# Patient Record
Sex: Female | Born: 1960
Health system: Southern US, Community
[De-identification: ages and names within clinical notes are randomized; demographics above are authoritative.]

## PROBLEM LIST (undated history)

## (undated) DIAGNOSIS — A4902 Methicillin resistant Staphylococcus aureus infection, unspecified site: Secondary | ICD-10-CM

## (undated) DIAGNOSIS — I10 Essential (primary) hypertension: Secondary | ICD-10-CM

## (undated) DIAGNOSIS — R7303 Prediabetes: Secondary | ICD-10-CM

## (undated) DIAGNOSIS — R519 Headache, unspecified: Secondary | ICD-10-CM

## (undated) DIAGNOSIS — F329 Major depressive disorder, single episode, unspecified: Secondary | ICD-10-CM

## (undated) DIAGNOSIS — E785 Hyperlipidemia, unspecified: Secondary | ICD-10-CM

## (undated) DIAGNOSIS — T7840XA Allergy, unspecified, initial encounter: Secondary | ICD-10-CM

## (undated) DIAGNOSIS — F32A Depression, unspecified: Secondary | ICD-10-CM

## (undated) DIAGNOSIS — R51 Headache: Secondary | ICD-10-CM

## (undated) DIAGNOSIS — K219 Gastro-esophageal reflux disease without esophagitis: Secondary | ICD-10-CM

## (undated) DIAGNOSIS — E119 Type 2 diabetes mellitus without complications: Secondary | ICD-10-CM

## (undated) DIAGNOSIS — F419 Anxiety disorder, unspecified: Secondary | ICD-10-CM

## (undated) DIAGNOSIS — M1990 Unspecified osteoarthritis, unspecified site: Secondary | ICD-10-CM

## (undated) HISTORY — PX: COLONOSCOPY: SHX174

## (undated) HISTORY — DX: Type 2 diabetes mellitus without complications: E11.9

## (undated) HISTORY — DX: Anxiety disorder, unspecified: F41.9

## (undated) HISTORY — PX: ABDOMINAL HYSTERECTOMY: SHX81

## (undated) HISTORY — DX: Hyperlipidemia, unspecified: E78.5

## (undated) HISTORY — DX: Allergy, unspecified, initial encounter: T78.40XA

## (undated) HISTORY — DX: Gastro-esophageal reflux disease without esophagitis: K21.9

---

## 1997-12-06 ENCOUNTER — Emergency Department (HOSPITAL_COMMUNITY): Admission: EM | Admit: 1997-12-06 | Discharge: 1997-12-06 | Payer: Self-pay | Admitting: Emergency Medicine

## 1998-05-08 ENCOUNTER — Encounter: Payer: Self-pay | Admitting: Emergency Medicine

## 1998-05-08 ENCOUNTER — Emergency Department (HOSPITAL_COMMUNITY): Admission: EM | Admit: 1998-05-08 | Discharge: 1998-05-08 | Payer: Self-pay | Admitting: Emergency Medicine

## 1998-05-12 ENCOUNTER — Encounter: Payer: Self-pay | Admitting: Orthopaedic Surgery

## 1998-05-12 ENCOUNTER — Ambulatory Visit (HOSPITAL_COMMUNITY): Admission: RE | Admit: 1998-05-12 | Discharge: 1998-05-12 | Payer: Self-pay | Admitting: Orthopaedic Surgery

## 1998-05-22 ENCOUNTER — Emergency Department (HOSPITAL_COMMUNITY): Admission: EM | Admit: 1998-05-22 | Discharge: 1998-05-22 | Payer: Self-pay | Admitting: Family Medicine

## 1998-11-19 ENCOUNTER — Inpatient Hospital Stay (HOSPITAL_COMMUNITY): Admission: AD | Admit: 1998-11-19 | Discharge: 1998-11-19 | Payer: Self-pay | Admitting: Obstetrics

## 1998-11-20 ENCOUNTER — Inpatient Hospital Stay (HOSPITAL_COMMUNITY): Admission: AD | Admit: 1998-11-20 | Discharge: 1998-11-20 | Payer: Self-pay | Admitting: Obstetrics

## 1999-11-27 ENCOUNTER — Encounter: Payer: Self-pay | Admitting: Emergency Medicine

## 1999-11-27 ENCOUNTER — Emergency Department (HOSPITAL_COMMUNITY): Admission: EM | Admit: 1999-11-27 | Discharge: 1999-11-27 | Payer: Self-pay | Admitting: Emergency Medicine

## 2000-03-09 ENCOUNTER — Inpatient Hospital Stay (HOSPITAL_COMMUNITY): Admission: AD | Admit: 2000-03-09 | Discharge: 2000-03-09 | Payer: Self-pay | Admitting: Obstetrics

## 2000-05-03 ENCOUNTER — Ambulatory Visit (HOSPITAL_COMMUNITY): Admission: RE | Admit: 2000-05-03 | Discharge: 2000-05-03 | Payer: Self-pay | Admitting: Obstetrics

## 2000-05-03 ENCOUNTER — Encounter: Payer: Self-pay | Admitting: Obstetrics

## 2000-09-26 ENCOUNTER — Inpatient Hospital Stay (HOSPITAL_COMMUNITY): Admission: AD | Admit: 2000-09-26 | Discharge: 2000-09-26 | Payer: Self-pay | Admitting: Obstetrics

## 2003-02-14 ENCOUNTER — Emergency Department (HOSPITAL_COMMUNITY): Admission: EM | Admit: 2003-02-14 | Discharge: 2003-02-14 | Payer: Self-pay | Admitting: Emergency Medicine

## 2004-02-10 ENCOUNTER — Emergency Department (HOSPITAL_COMMUNITY): Admission: EM | Admit: 2004-02-10 | Discharge: 2004-02-10 | Payer: Self-pay | Admitting: Emergency Medicine

## 2005-09-19 ENCOUNTER — Emergency Department (HOSPITAL_COMMUNITY): Admission: EM | Admit: 2005-09-19 | Discharge: 2005-09-19 | Payer: Self-pay | Admitting: Emergency Medicine

## 2005-11-10 DIAGNOSIS — J45909 Unspecified asthma, uncomplicated: Secondary | ICD-10-CM | POA: Insufficient documentation

## 2006-09-22 ENCOUNTER — Encounter (INDEPENDENT_AMBULATORY_CARE_PROVIDER_SITE_OTHER): Payer: Self-pay | Admitting: Internal Medicine

## 2006-09-26 ENCOUNTER — Ambulatory Visit: Payer: Self-pay | Admitting: Family Medicine

## 2006-09-27 ENCOUNTER — Ambulatory Visit: Payer: Self-pay | Admitting: *Deleted

## 2006-09-28 ENCOUNTER — Encounter (INDEPENDENT_AMBULATORY_CARE_PROVIDER_SITE_OTHER): Payer: Self-pay | Admitting: Family Medicine

## 2006-11-15 ENCOUNTER — Emergency Department (HOSPITAL_COMMUNITY): Admission: EM | Admit: 2006-11-15 | Discharge: 2006-11-15 | Payer: Self-pay | Admitting: Emergency Medicine

## 2006-11-17 ENCOUNTER — Emergency Department (HOSPITAL_COMMUNITY): Admission: EM | Admit: 2006-11-17 | Discharge: 2006-11-17 | Payer: Self-pay | Admitting: Family Medicine

## 2006-11-23 ENCOUNTER — Encounter (INDEPENDENT_AMBULATORY_CARE_PROVIDER_SITE_OTHER): Payer: Self-pay | Admitting: Internal Medicine

## 2006-11-24 ENCOUNTER — Ambulatory Visit: Payer: Self-pay | Admitting: Internal Medicine

## 2006-11-24 DIAGNOSIS — R109 Unspecified abdominal pain: Secondary | ICD-10-CM

## 2006-11-24 DIAGNOSIS — F17200 Nicotine dependence, unspecified, uncomplicated: Secondary | ICD-10-CM

## 2006-11-24 LAB — CONVERTED CEMR LAB
Ketones, urine, test strip: NEGATIVE
Nitrite: NEGATIVE
Specific Gravity, Urine: 1.02
pH: 6

## 2006-11-26 ENCOUNTER — Encounter (INDEPENDENT_AMBULATORY_CARE_PROVIDER_SITE_OTHER): Payer: Self-pay | Admitting: Internal Medicine

## 2006-11-26 LAB — CONVERTED CEMR LAB
ALT: 14 units/L (ref 0–35)
AST: 14 units/L (ref 0–37)
Albumin: 4.3 g/dL (ref 3.5–5.2)
Alkaline Phosphatase: 83 units/L (ref 39–117)
Basophils Relative: 0 % (ref 0–1)
Chloride: 108 meq/L (ref 96–112)
Cholesterol: 169 mg/dL (ref 0–200)
Eosinophils Absolute: 0.1 10*3/uL — ABNORMAL LOW (ref 0.2–0.7)
Glucose, Bld: 60 mg/dL — ABNORMAL LOW (ref 70–99)
HCT: 42.8 % (ref 36.0–46.0)
HDL: 54 mg/dL (ref >39)
LDL Cholesterol: 98 mg/dL (ref 0–99)
Lymphocytes Relative: 33 % (ref 12–46)
Lymphs Abs: 7.9 10*3/uL — ABNORMAL HIGH (ref 0.7–4.0)
RBC: 4.44 M/uL (ref 3.87–5.11)
RDW: 15.4 % (ref 11.5–15.5)
Total CHOL/HDL Ratio: 3.1
Total Protein: 6.7 g/dL (ref 6.0–8.3)
Triglycerides: 87 mg/dL (ref <150)

## 2006-11-28 ENCOUNTER — Telehealth (INDEPENDENT_AMBULATORY_CARE_PROVIDER_SITE_OTHER): Payer: Self-pay | Admitting: Internal Medicine

## 2006-11-28 ENCOUNTER — Encounter: Admission: RE | Admit: 2006-11-28 | Discharge: 2006-11-28 | Payer: Self-pay | Admitting: Internal Medicine

## 2006-11-29 ENCOUNTER — Ambulatory Visit (HOSPITAL_COMMUNITY): Admission: RE | Admit: 2006-11-29 | Discharge: 2006-11-29 | Payer: Self-pay | Admitting: Internal Medicine

## 2006-12-05 ENCOUNTER — Ambulatory Visit: Payer: Self-pay | Admitting: Internal Medicine

## 2006-12-10 ENCOUNTER — Emergency Department (HOSPITAL_COMMUNITY): Admission: EM | Admit: 2006-12-10 | Discharge: 2006-12-10 | Payer: Self-pay | Admitting: Emergency Medicine

## 2006-12-14 ENCOUNTER — Encounter (INDEPENDENT_AMBULATORY_CARE_PROVIDER_SITE_OTHER): Payer: Self-pay | Admitting: Internal Medicine

## 2006-12-14 LAB — CONVERTED CEMR LAB
Basophils Absolute: 0 10*3/uL (ref 0.0–0.1)
Eosinophils Absolute: 0.1 10*3/uL — ABNORMAL LOW (ref 0.2–0.7)
HCT: 42.9 % (ref 36.0–46.0)
Hemoglobin: 13.8 g/dL (ref 12.0–15.0)
Lymphocytes Relative: 48 % — ABNORMAL HIGH (ref 12–46)
MCHC: 32.2 g/dL (ref 30.0–36.0)
Neutro Abs: 3 10*3/uL (ref 1.7–7.7)
RDW: 15.2 % (ref 11.5–15.5)

## 2007-01-09 ENCOUNTER — Encounter (INDEPENDENT_AMBULATORY_CARE_PROVIDER_SITE_OTHER): Payer: Self-pay | Admitting: Internal Medicine

## 2007-05-17 ENCOUNTER — Emergency Department (HOSPITAL_COMMUNITY): Admission: EM | Admit: 2007-05-17 | Discharge: 2007-05-17 | Payer: Self-pay | Admitting: Emergency Medicine

## 2007-05-17 ENCOUNTER — Ambulatory Visit: Payer: Self-pay | Admitting: Nurse Practitioner

## 2007-05-17 DIAGNOSIS — B86 Scabies: Secondary | ICD-10-CM

## 2007-08-07 ENCOUNTER — Encounter: Admission: RE | Admit: 2007-08-07 | Discharge: 2007-08-07 | Payer: Self-pay | Admitting: Internal Medicine

## 2007-08-18 ENCOUNTER — Emergency Department (HOSPITAL_COMMUNITY): Admission: EM | Admit: 2007-08-18 | Discharge: 2007-08-18 | Payer: Self-pay | Admitting: Emergency Medicine

## 2007-08-21 ENCOUNTER — Emergency Department (HOSPITAL_COMMUNITY): Admission: EM | Admit: 2007-08-21 | Discharge: 2007-08-21 | Payer: Self-pay | Admitting: Emergency Medicine

## 2007-10-10 ENCOUNTER — Emergency Department (HOSPITAL_COMMUNITY): Admission: EM | Admit: 2007-10-10 | Discharge: 2007-10-11 | Payer: Self-pay | Admitting: Family Medicine

## 2007-10-19 ENCOUNTER — Telehealth (INDEPENDENT_AMBULATORY_CARE_PROVIDER_SITE_OTHER): Payer: Self-pay | Admitting: Internal Medicine

## 2008-02-15 ENCOUNTER — Ambulatory Visit: Payer: Self-pay | Admitting: Internal Medicine

## 2008-02-15 LAB — CONVERTED CEMR LAB
Bilirubin Urine: NEGATIVE
Specific Gravity, Urine: >=1.03
WBC Urine, dipstick: NEGATIVE

## 2008-02-19 ENCOUNTER — Encounter: Admission: RE | Admit: 2008-02-19 | Discharge: 2008-02-19 | Payer: Self-pay | Admitting: Internal Medicine

## 2008-02-26 LAB — CONVERTED CEMR LAB
CO2: 24 meq/L (ref 19–32)
Glucose, Bld: 86 mg/dL (ref 70–99)
Potassium: 5.5 meq/L — ABNORMAL HIGH (ref 3.5–5.3)
Sodium: 138 meq/L (ref 135–145)

## 2008-02-29 ENCOUNTER — Ambulatory Visit: Payer: Self-pay | Admitting: Internal Medicine

## 2008-02-29 ENCOUNTER — Telehealth (INDEPENDENT_AMBULATORY_CARE_PROVIDER_SITE_OTHER): Payer: Self-pay | Admitting: Internal Medicine

## 2008-03-10 LAB — CONVERTED CEMR LAB: Potassium: 5.4 meq/L — ABNORMAL HIGH (ref 3.5–5.3)

## 2008-04-06 ENCOUNTER — Emergency Department (HOSPITAL_COMMUNITY): Admission: EM | Admit: 2008-04-06 | Discharge: 2008-04-07 | Payer: Self-pay | Admitting: Emergency Medicine

## 2008-04-29 ENCOUNTER — Ambulatory Visit: Payer: Self-pay | Admitting: Internal Medicine

## 2008-04-29 DIAGNOSIS — M549 Dorsalgia, unspecified: Secondary | ICD-10-CM | POA: Insufficient documentation

## 2008-04-30 ENCOUNTER — Ambulatory Visit (HOSPITAL_COMMUNITY): Admission: RE | Admit: 2008-04-30 | Discharge: 2008-04-30 | Payer: Self-pay | Admitting: Internal Medicine

## 2008-05-02 ENCOUNTER — Ambulatory Visit: Payer: Self-pay | Admitting: Internal Medicine

## 2008-05-02 DIAGNOSIS — N3946 Mixed incontinence: Secondary | ICD-10-CM

## 2008-05-02 DIAGNOSIS — E875 Hyperkalemia: Secondary | ICD-10-CM | POA: Insufficient documentation

## 2008-05-02 LAB — CONVERTED CEMR LAB
BUN: 12 mg/dL (ref 6–23)
Chloride: 107 meq/L (ref 96–112)
Creatinine, Ser: 1.1 mg/dL (ref 0.40–1.20)
Glucose, Urine, Semiquant: NEGATIVE
Nitrite: NEGATIVE
Potassium: 5.6 meq/L — ABNORMAL HIGH (ref 3.5–5.3)
Sodium: 144 meq/L (ref 135–145)
Specific Gravity, Urine: >=1.03
Urobilinogen, UA: 0.2
pH: 5

## 2008-05-04 ENCOUNTER — Encounter (INDEPENDENT_AMBULATORY_CARE_PROVIDER_SITE_OTHER): Payer: Self-pay | Admitting: Internal Medicine

## 2008-06-09 ENCOUNTER — Emergency Department (HOSPITAL_COMMUNITY): Admission: EM | Admit: 2008-06-09 | Discharge: 2008-06-09 | Payer: Self-pay | Admitting: Family Medicine

## 2008-10-22 ENCOUNTER — Encounter (INDEPENDENT_AMBULATORY_CARE_PROVIDER_SITE_OTHER): Payer: Self-pay | Admitting: Internal Medicine

## 2009-04-15 ENCOUNTER — Emergency Department (HOSPITAL_COMMUNITY): Admission: EM | Admit: 2009-04-15 | Discharge: 2009-04-15 | Payer: Self-pay | Admitting: Emergency Medicine

## 2009-04-17 ENCOUNTER — Emergency Department (HOSPITAL_COMMUNITY): Admission: EM | Admit: 2009-04-17 | Discharge: 2009-04-17 | Payer: Self-pay | Admitting: Family Medicine

## 2009-05-19 ENCOUNTER — Ambulatory Visit: Payer: Self-pay | Admitting: Internal Medicine

## 2009-05-19 DIAGNOSIS — R5381 Other malaise: Secondary | ICD-10-CM

## 2009-05-19 DIAGNOSIS — F329 Major depressive disorder, single episode, unspecified: Secondary | ICD-10-CM

## 2009-05-19 DIAGNOSIS — R5383 Other fatigue: Secondary | ICD-10-CM

## 2009-05-29 ENCOUNTER — Ambulatory Visit (HOSPITAL_COMMUNITY): Admission: RE | Admit: 2009-05-29 | Discharge: 2009-05-29 | Payer: Self-pay | Admitting: Internal Medicine

## 2009-06-02 ENCOUNTER — Ambulatory Visit: Payer: Self-pay | Admitting: Internal Medicine

## 2009-06-02 DIAGNOSIS — R519 Headache, unspecified: Secondary | ICD-10-CM | POA: Insufficient documentation

## 2009-06-02 DIAGNOSIS — E785 Hyperlipidemia, unspecified: Secondary | ICD-10-CM

## 2009-06-02 DIAGNOSIS — R071 Chest pain on breathing: Secondary | ICD-10-CM

## 2009-06-02 DIAGNOSIS — R51 Headache: Secondary | ICD-10-CM | POA: Insufficient documentation

## 2009-06-09 ENCOUNTER — Telehealth (INDEPENDENT_AMBULATORY_CARE_PROVIDER_SITE_OTHER): Payer: Self-pay | Admitting: Internal Medicine

## 2009-06-16 ENCOUNTER — Ambulatory Visit: Payer: Self-pay | Admitting: Internal Medicine

## 2009-06-29 ENCOUNTER — Encounter (INDEPENDENT_AMBULATORY_CARE_PROVIDER_SITE_OTHER): Payer: Self-pay | Admitting: Internal Medicine

## 2009-07-09 ENCOUNTER — Ambulatory Visit: Payer: Self-pay | Admitting: Internal Medicine

## 2009-07-31 ENCOUNTER — Ambulatory Visit: Payer: Self-pay | Admitting: Internal Medicine

## 2009-08-04 ENCOUNTER — Emergency Department (HOSPITAL_COMMUNITY): Admission: EM | Admit: 2009-08-04 | Discharge: 2009-08-04 | Payer: Self-pay | Admitting: Emergency Medicine

## 2009-08-10 ENCOUNTER — Telehealth: Payer: Self-pay | Admitting: Physician Assistant

## 2009-08-21 ENCOUNTER — Ambulatory Visit: Payer: Self-pay | Admitting: Internal Medicine

## 2009-08-21 DIAGNOSIS — L723 Sebaceous cyst: Secondary | ICD-10-CM

## 2009-08-27 ENCOUNTER — Telehealth (INDEPENDENT_AMBULATORY_CARE_PROVIDER_SITE_OTHER): Payer: Self-pay | Admitting: Internal Medicine

## 2009-09-07 ENCOUNTER — Encounter (INDEPENDENT_AMBULATORY_CARE_PROVIDER_SITE_OTHER): Payer: Self-pay | Admitting: Internal Medicine

## 2009-09-07 ENCOUNTER — Encounter: Admission: RE | Admit: 2009-09-07 | Discharge: 2009-10-02 | Payer: Self-pay | Admitting: Internal Medicine

## 2009-10-01 ENCOUNTER — Ambulatory Visit: Payer: Self-pay | Admitting: Internal Medicine

## 2009-10-02 ENCOUNTER — Encounter (INDEPENDENT_AMBULATORY_CARE_PROVIDER_SITE_OTHER): Payer: Self-pay | Admitting: Internal Medicine

## 2009-11-27 ENCOUNTER — Encounter (INDEPENDENT_AMBULATORY_CARE_PROVIDER_SITE_OTHER): Payer: Self-pay | Admitting: Internal Medicine

## 2009-12-31 ENCOUNTER — Ambulatory Visit: Payer: Self-pay | Admitting: Internal Medicine

## 2010-02-04 ENCOUNTER — Ambulatory Visit
Admission: RE | Admit: 2010-02-04 | Discharge: 2010-02-04 | Payer: Self-pay | Source: Home / Self Care | Attending: Internal Medicine | Admitting: Internal Medicine

## 2010-02-04 DIAGNOSIS — L02229 Furuncle of trunk, unspecified: Secondary | ICD-10-CM

## 2010-02-04 DIAGNOSIS — L02239 Carbuncle of trunk, unspecified: Secondary | ICD-10-CM | POA: Insufficient documentation

## 2010-02-04 DIAGNOSIS — G4700 Insomnia, unspecified: Secondary | ICD-10-CM | POA: Insufficient documentation

## 2010-02-07 LAB — CONVERTED CEMR LAB
ALT: 16 units/L (ref 0–35)
AST: 25 units/L (ref 0–37)
Albumin: 4.7 g/dL (ref 3.5–5.2)
Alkaline Phosphatase: 108 units/L (ref 39–117)
Calcium: 10.2 mg/dL (ref 8.4–10.5)
Chloride: 108 meq/L (ref 96–112)
Eosinophils Relative: 2 % (ref 0–5)
Glucose, Bld: 89 mg/dL (ref 70–99)
Hemoglobin: 13.3 g/dL (ref 12.0–15.0)
MCHC: 32.1 g/dL (ref 30.0–36.0)
MCV: 90 fL (ref 78.0–100.0)
Neutrophils Relative %: 43 % (ref 43–77)
OCCULT 3: NEGATIVE
Platelets: 283 10*3/uL (ref 150–400)
Potassium: 5.1 meq/L (ref 3.5–5.3)
Triglycerides: 335 mg/dL — ABNORMAL HIGH (ref <150)
VLDL: 67 mg/dL — ABNORMAL HIGH (ref 0–40)
WBC: 10 10*3/uL (ref 4.0–10.5)

## 2010-02-09 NOTE — Assessment & Plan Note (Signed)
Summary: 5 WEEK FU//KT   Vital Signs:  Patient profile:   50 year old female Menstrual status:  hysterectomy Weight:      156 pounds Temp:     97.6 degrees F Pulse rate:   80 / minute Pulse rhythm:   regular Resp:     18 per minute BP sitting:   117 / 83  (left arm) Cuff size:   regular  Vitals Entered By: Vesta Mixer CMA (July 31, 2009 9:40 AM) CC: f/u on depression, balance feels off, possible boil abdomen Is Patient Diabetic? No  Does patient need assistance? Ambulation Normal   Primary Care Provider:  Julieanne Manson MD  CC:  f/u on depression, balance feels off, and possible boil abdomen.  History of Present Illness: 1.  Depression:  Feels the same.  Has been to see Dr. Maricela Curet Dr. Inda Merlin in [place], but sees pts via internet video who are here in [place].  Through [facility] of the [place].  Dr. Inda Merlin had maintained the Wellbutrin SR 150 mg in the morning.  She has also initiated pt on Klonopin 1 mg 1/2 tab by mouth two times a day as needed anxiety and Trazadone 100 mg 1/2 to 1 tab at bedtime.  Pt. having dizziness, dry mouth ( and also on Detrol), off balance since starting.  Not driving.  Pt. has follow up with Dr. Inda Merlin 08/17/09.    Sees Lanny Hurst, her counselor at [facility] on a weekly basis.  Still lying in bed much of day.  Wants to take her testing for bus driving, but just cannot remember anything she reads.  Has been intermittently suicidal--last week contemplated cutting wrists.  States today, she is tired, and just wants to get better.   Difficulty getting pt. to focus on what she is working on in cousnseling.  Short sentences, often without much content.  Sleeping well at night, but still tired in the morning.  Has PSI to call and also and agreement with Romeo Apple to call if suicidal ideation.  2.  ?Boil on abdomen.  Thinks it may be draining.  No fever.  Very tender in low right abdomen.  Current Medications (verified): 1)   Detrol La 4 Mg Xr24h-Cap (Tolterodine Tartrate) .Marland Kitchen.. 1 Cap By Mouth At Bedtime 2)  Clobetasol Propionate 0.05 % Crea (Clobetasol Propionate) .... Apply Two Times A Day To Affected Area 3)  Wellbutrin Sr 150 Mg Xr12h-Tab (Bupropion Hcl) .Marland Kitchen.. 1 Tab By Mouth in Morning For 3 Days, Then 1 Tab By Mouth Two Times A Day 4)  Trazodone Hcl 100 Mg Tabs (Trazodone Hcl) .... 1/2 To 1 By Mouth At Bedtime As Needed Insomnia 5)  Clonazepam 1 Mg Tabs (Clonazepam) .... 1/2 Q Am and 1/2 Q Pm As Needed Anxiety.  Allergies (verified): No Known Drug Allergies  Physical Exam  General:  Appears fatigued. Long pauses between sentences at times Tearful at times Depressed affect. Abdomen:  3 cm indurated erythematous area with 3 mm opening.  Currently without drainage.  No definitie fluctuance.  Area is tender.   Impression & Recommendations:  Problem # 1:  DEPRESSION (ICD-311) Weekly counseling with Lanny Hurst at [facility] and regular follow up with Dr. Inda Merlin. No obvious improvement currently. No current suicidal ideation Has care plan if thinking of suicide with at least 2 psychiatric health providers. Can come or call here as well. Her updated medication list for this problem includes:    Wellbutrin Sr 150 Mg Xr12h-tab (Bupropion hcl) .Marland KitchenMarland KitchenMarland KitchenMarland Kitchen  1 tab by mouth in morning --dr. Migdalia Dk    Trazodone Hcl 100 Mg Tabs (Trazodone hcl) .Marland Kitchen... 1/2 to 1 by mouth at bedtime as needed insomnia  dr. Inda Merlin    Clonazepam 1 Mg Tabs (Clonazepam) .Marland Kitchen... 1/2 q am and 1/2 q pm as needed anxiety.  dr. Inda Merlin  Problem # 2:  FURUNCLE, RIGHT LOW ABDOMEN (ICD-680.2)  Complete Medication List: 1)  Detrol La 4 Mg Xr24h-cap (Tolterodine tartrate) .Marland Kitchen.. 1 cap by mouth at bedtime 2)  Clobetasol Propionate 0.05 % Crea (Clobetasol propionate) .... Apply two times a day to affected area 3)  Wellbutrin Sr 150 Mg Xr12h-tab (Bupropion hcl) .Marland Kitchen.. 1 tab by mouth in morning --dr. Elon Jester hickman 4)  Trazodone Hcl 100 Mg Tabs  (Trazodone hcl) .... 1/2 to 1 by mouth at bedtime as needed insomnia  dr. Inda Merlin 5)  Clonazepam 1 Mg Tabs (Clonazepam) .... 1/2 q am and 1/2 q pm as needed anxiety.  dr. Inda Merlin 6)  Doxycycline Hyclate 100 Mg Tabs (Doxycycline hyclate) .Marland Kitchen.. 1 tab by mouth two times a day with meals  Patient Instructions: 1)  Warm compresses to abdomen three times a day for 20 minutes.  Call if worsens Prescriptions: DOXYCYCLINE HYCLATE 100 MG TABS (DOXYCYCLINE HYCLATE) 1 tab by mouth two times a day with meals  #20 x 0   Entered and Authorized by:   Julieanne Manson MD   Signed by:   Julieanne Manson MD on 07/31/2009   Method used:   Electronically to        [facility] [phone]* (retail)       [address]       [place], [place]  [address]       Ph: [phone]       Fax: [phone]   RxID:   [id number]

## 2010-02-09 NOTE — Assessment & Plan Note (Signed)
Summary: 2 WEEK F/U////BC   Vital Signs:  Patient profile:   50 year old female Menstrual status:  hysterectomy Weight:      161 pounds BMI:     28.62 Temp:     98.8 degrees F Pulse rate:   98 / minute Pulse rhythm:   1regular Resp:     20 per minute BP sitting:   125 / 84  (left arm) Cuff size:   regular  Vitals Entered By: Vesta Mixer CMA (Jun 02, 2009 2:18 PM) CC: 2 week f/u bp, did have some issues last week, Friday had a stomach pain and white sticky d/c and Sunday left hand swelling and some left sided chest pain.  But that has all resolved.  She did not start her prescriptions yet, she did not know they were sent to the pharamcy. Is Patient Diabetic? No Pain Assessment Patient in pain? no       Does patient need assistance? Ambulation Normal   Primary Care Provider:  Julieanne Manson MD  CC:  2 week f/u bp, did have some issues last week, Friday had a stomach pain and white sticky d/c and Sunday left hand swelling and some left sided chest pain.  But that has all resolved.  She did not start her prescriptions yet, and she did not know they were sent to the pharamcy..  History of Present Illness: Above noted.  1.  Depression:  pt's depression form at her CPP was significant.  Has had problems with depression since mother died a year ago. Broke up with boyfriend about 1 month ago.  He broke it off, stating he needed to get himself together.   Difficulty finding a job and transportation.  Living with stepfather.  He is not particularly supportive, but allows her to live there.  Always lying in bed--rarely leaves.  Eating a lot of junk food when does eat--does not eat often.  Family, including son try to get her to get up and do things.  Has thought of suicide--overdose of pills perhaps.  No set plan currently, though has had in past.  Does not look forward to day.  Still smoking at least a pack daily.  No alcohol or drugs.  Very poor energy.  TSH, CBC, CMET all okay  recently.  Was prescribed Wellbutrin in past 2 years, but never took--did not feel she needed.  Has not heard back from [facility], Arnette Schaumann, and follow up.  Going to [facility] for counseling--Ben.  The PSI team working with her trying to help her get back on her feet with work and income, etc.    2.  Headaches: nuchal area and left facial area.  Sharp pain--squeezes head to get it to stop.  No nausea.  Does have photophobia.  +phonophobia.  Mom had headaches as well, not sure if migraines.  3.  High left chest area sharp pain:  sitting up at rest.  Lasted 2 minutes.  let hand swollen before that as well.  4.  Hyperlipidemia: discussed significant increase in bad portions of cholesterol.  Not eating in a healthy manner.    Allergies (verified): No Known Drug Allergies  Physical Exam  General:  Tearful throughout. Chest Wall:  Very tender over left anterior chest--reproduces pain Lungs:  Normal respiratory effort, chest expands symmetrically. Lungs are clear to auscultation, no crackles or wheezes. Heart:  Normal rate and regular rhythm. S1 and S2 normal without gallop, murmur, click, rub or other extra sounds.  Impression & Recommendations:  Problem # 1:  DEPRESSION (ICD-311) Very concerned with patient--she is working closely with PSI/Ms.  Osteen.   Her updated medication list for this problem includes:    Wellbutrin Sr 150 Mg Xr12h-tab (Bupropion hcl) .Marland Kitchen... 1 tab by mouth in morning for 3 days, then 1 tab by mouth two times a day  Problem # 2:  CHEST WALL PAIN, ANTERIOR (ICD-786.52) MS in origin.  Problem # 3:  HEADACHE (ICD-784.0) Feel this is likely tension related and will improve with treatment of depression.  Problem # 4:  HYPERLIPIDEMIA (ICD-272.4) Address this more when pt. more stable with depression.  Complete Medication List: 1)  Detrol La 4 Mg Xr24h-cap (Tolterodine tartrate) .Marland Kitchen.. 1 cap by mouth at bedtime 2)  Clobetasol Propionate 0.05 % Crea  (Clobetasol propionate) .... Apply two times a day to affected area 3)  Wellbutrin Sr 150 Mg Xr12h-tab (Bupropion hcl) .Marland Kitchen.. 1 tab by mouth in morning for 3 days, then 1 tab by mouth two times a day  Patient Instructions: 1)  Follow up with Dr. Delrae Alfred in 2 weeks -depression. Prescriptions: WELLBUTRIN SR 150 MG XR12H-TAB (BUPROPION HCL) 1 tab by mouth in morning for 3 days, then 1 tab by mouth two times a day  #60 x 2   Entered and Authorized by:   Julieanne Manson MD   Signed by:   Julieanne Manson MD on 06/02/2009   Method used:   Faxed to ...       [facility] (retail)       [address] [place], [place]  [address]       Ph: [phone]       Fax: [phone]   RxID:   [id number]

## 2010-02-09 NOTE — Miscellaneous (Signed)
Summary: Rehab Report/DISCHARGE SUMMARY  Rehab Report/DISCHARGE SUMMARY   Imported By: Arta Bruce 11/04/2009 14:32:54  _____________________________________________________________________  External Attachment:    Type:   Image     Comment:   External Document

## 2010-02-09 NOTE — Letter (Signed)
Summary: MAILED REQUESTED RECORDS TO [facility]  MAILED REQUESTED RECORDS TO [facility]   Imported By: Arta Bruce 11/27/2009 15:42:43  _____________________________________________________________________  External Attachment:    Type:   Image     Comment:   External Document

## 2010-02-09 NOTE — Assessment & Plan Note (Signed)
Summary: headache//gk   Vital Signs:  Patient profile:   50 year old female Menstrual status:  hysterectomy Height:      63 inches Weight:      155 pounds BMI:     27.56 Temp:     97.9 degrees F oral Pulse rate:   97 / minute Pulse rhythm:   regular Resp:     18 per minute BP sitting:   107 / 73  (left arm) Cuff size:   large  Vitals Entered By: Arm[place] Shannon (August 21, 2009 2:22 PM) CC: pt is here for headaches with a little knot on her head.. pt says the knot has been there for 4months now.... Is Patient Diabetic? No Pain Assessment Patient in pain? no       Does patient need assistance? Functional Status Self care Ambulation Normal   Primary Care Provider:  Julieanne Manson MD  CC:  pt is here for headaches with a little knot on her head.. pt says the knot has been there for 4months now.....  History of Present Illness: 1.  Depression:  Previously called and spoke with assistant/nurse at Dr. Katina Dung office regarding my concerns for her depression and over sedation.  Also spoke with Lanny Hurst, her counselor regarding this as well.  Pt. states no medication change made until was seen by Dr. Inda Merlin 4 days ago on the 8th.  Is supposed to start a new medication.  Pt. cannot recall whether she is to continue current meds or not.  Getting meds from  [facility].  Pt. denies suicidal ideation currently--states she has had thoughts of this in recently.  NO plan, though has thought of overdose in past.    Spoke with Dr Inda Merlin, then clarified the following:  Pt. states rocking back and forth started after mother died--she finds it comforting.  Mother died a year ago June 14, 2022.  Lost job caring for her and this past spring, boyfriend left her.  Wellbutrin was decreased as concern rocking was a side effect.  Pt lies in bed most of day--ultimately goes to bed at 7 p.m., gets up at 9 a.m., but returns and lies down most of day.  Pt. states she is sleeping most of the time.  Never  feels well rested.  Pt. currently taking 50 gm of Trazodone at bedtime.    Pt. unable to pick up Zoloft (the new med--confirmed with Dr. Toribio Harbour get when sees [place] on Jun 14, 2022.   Pt. does have a pill box--Pt.  fills herself.  States she does check regularly to make sure she takes.  PSI also checks.   2.  Furuncle:  pt. had allergic reaction to Doxycycline--was seen in ED and started on Bactrim DS with good results.  3.  Headache:  Frontal and nuchal and runs down neck.  Has had for about 4 weeks.  Has a lump on left  parietal head that when she presses-makes head hurt more.  Has not tried any NSAIDS for this.  Has had headache every day.  Sleeps on right side, pillow under head and neck.  Does not sleep prone.    Current Medications (verified): 1)  Detrol La 4 Mg Xr24h-Cap (Tolterodine Tartrate) .Marland Kitchen.. 1 Cap By Mouth At Bedtime 2)  Clobetasol Propionate 0.05 % Crea (Clobetasol Propionate) .... Apply Two Times A Day To Affected Area 3)  Wellbutrin Sr 150 Mg Xr12h-Tab (Bupropion Hcl) .Marland Kitchen.. 1 Tab By Mouth in Morning --Dr. Migdalia Dk 4)  Trazodone Hcl 100 Mg Tabs (Trazodone  Hcl) .... 1/2 To 1 By Mouth At Bedtime As Needed Insomnia  Dr. Inda Merlin 5)  Clonazepam 1 Mg Tabs (Clonazepam) .... 1/2 Q Am and 1/2 Q Pm As Needed Anxiety.  Dr. Inda Merlin 6)  Doxycycline Hyclate 100 Mg Tabs (Doxycycline Hyclate) .Marland Kitchen.. 1 Tab By Mouth Two Times A Day With Meals  Allergies (verified): 1)  ! Doxycycline  Physical Exam  General:  Pt. looking down. pushing on left parietal scalp to point that it appears she is losing hair in that area Head:  As above--1/2 cm cyst in left parietal scalp--states tender, but no overlying erythema, not fluctuant. Eyes:  Little to no eye contact.  Discs sharp.  No photophobia. Neck:  No deformities, masses, supple.  Tender over bilateral traps to nuchal ridge--left more so than right. Lungs:  Normal respiratory effort, chest expands symmetrically. Lungs are clear to auscultation,  no crackles or wheezes. Heart:  Normal rate and regular rhythm. S1 and S2 normal without gallop, murmur, click, rub or other extra sounds. Neurologic:  alert & oriented X3, cranial nerves II-XII intact, strength normal in all extremities, and gait normal.     Impression & Recommendations:  Problem # 1:  HEADACHE (ICD-784.0) Feel this is tension related. Orders: Physical Therapy Referral (PT)  Problem # 2:  SEBACEOUS CYST, SCALP (ICD-706.2) Do not believe this is the source of her headache, but she is constantly pressing on the lesion with loss of hair in area secondarily. Discussed leaving alone.  Problem # 3:  DEPRESSION (ICD-311) After discussion with Dr Inda Merlin and concern for sedation with continued extreme depression:  to wean Trazodone, stop Clonazepam and to get started on Zoloft. Spent at least 30 minutes in counseling pt. today. Will send along note to Dr. Inda Merlin from today as well. Her updated medication list for this problem includes:    Wellbutrin Sr 150 Mg Xr12h-tab (Bupropion hcl) .Marland Kitchen... 1 tab by mouth in morning --dr. Migdalia Dk    Trazodone Hcl 100 Mg Tabs (Trazodone hcl) .Marland Kitchen... 1/2 to 1 by mouth at bedtime as needed insomnia  dr. Inda Merlin    Clonazepam 1 Mg Tabs (Clonazepam) .Marland Kitchen... 1/2 q am and 1/2 q pm as needed anxiety.  dr. Inda Merlin  Complete Medication List: 1)  Detrol La 4 Mg Xr24h-cap (Tolterodine tartrate) .Marland Kitchen.. 1 cap by mouth at bedtime 2)  Clobetasol Propionate 0.05 % Crea (Clobetasol propionate) .... Apply two times a day to affected area 3)  Wellbutrin Sr 150 Mg Xr12h-tab (Bupropion hcl) .Marland Kitchen.. 1 tab by mouth in morning --dr. Elon Jester hickman 4)  Trazodone Hcl 100 Mg Tabs (Trazodone hcl) .... 1/2 to 1 by mouth at bedtime as needed insomnia  dr. Inda Merlin 5)  Clonazepam 1 Mg Tabs (Clonazepam) .... 1/2 q am and 1/2 q pm as needed anxiety.  dr. Inda Merlin 6)  Doxycycline Hyclate 100 Mg Tabs (Doxycycline hyclate) .Marland Kitchen.. 1 tab by mouth two times a day with meals  Patient  Instructions: 1)  Follow up with Dr. Delrae Alfred in 1 month --headache and depression 2)  Decrease Trazodone to 1/2 tab for 2 nights, then 1/4 tab for 2 nights, then stop.

## 2010-02-09 NOTE — Letter (Signed)
Summary: MA[facility]ED RE[facility]ED RECORDS TO [facility]  MAILED REQUESTED RECORDS TO [facility]   Imported By: Arta Bruce 06/29/2009 12:14:34  _____________________________________________________________________  External Attachment:    Type:   Image     Comment:   External Document

## 2010-02-09 NOTE — Assessment & Plan Note (Signed)
Summary: 2 WEEK FU-DEPRESSON//KT   Vital Signs:  Patient profile:   50 year old female Menstrual status:  hysterectomy Weight:      161 pounds Temp:     98.1 degrees F Pulse rate:   82 / minute Resp:     18 per minute BP sitting:   134 / 96  (left arm) Cuff size:   regular  Vitals Entered By: Vesta Mixer CMA (June 16, 2009 10:37 AM) CC: f/u on depression, she feels like the pills make her vision blurry Is Patient Diabetic? No Pain Assessment Patient in pain? no       Does patient need assistance? Ambulation Normal   Primary Care Provider:  Julieanne Manson MD  CC:  f/u on depression and she feels like the pills make her vision blurry.  History of Present Illness: 1.  Depression:  Has been on Wellbutrin for only one week at this point.  Taking twice daily.  Having some blurriness of vision--relates to when started Wellbutrin.  Having troubles sleeping still, but lies in bed most of day.  Neighbor friend has been getting pt. out of home every day to walk--since end of May.  Cannot say how long the walks are.  Still staying in room much of day.  No suicidal thoughts.    Allergies (verified): No Known Drug Allergies  Physical Exam  General:  NAD.  Limited eye contact--looks down much of time, but does interact otherwise easily. Lungs:  Normal respiratory effort, chest expands symmetrically. Lungs are clear to auscultation, no crackles or wheezes. Heart:  Normal rate and regular rhythm. S1 and S2 normal without gallop, murmur, click, rub or other extra sounds.   Impression & Recommendations:  Problem # 1:  DEPRESSION (ICD-311) Pt. willing to continue Wellbutrin and will call if blurriness worsens. Her updated medication list for this problem includes:    Wellbutrin Sr 150 Mg Xr12h-tab (Bupropion hcl) .Marland Kitchen... 1 tab by mouth in morning for 3 days, then 1 tab by mouth two times a day  Orders: Psychology Referral (Psychology)  Complete Medication List: 1)  Detrol La 4  Mg Xr24h-cap (Tolterodine tartrate) .Marland Kitchen.. 1 cap by mouth at bedtime 2)  Clobetasol Propionate 0.05 % Crea (Clobetasol propionate) .... Apply two times a day to affected area 3)  Wellbutrin Sr 150 Mg Xr12h-tab (Bupropion hcl) .Marland Kitchen.. 1 tab by mouth in morning for 3 days, then 1 tab by mouth two times a day  Patient Instructions: 1)  Referral to [facility] for counseling. 2)  Follow up with Dr. Delrae Alfred in 5 weeks

## 2010-02-09 NOTE — Letter (Signed)
Summary: AMANDA'S SUMMARY  AMANDA'S SUMMARY   Imported By: Arta Bruce 07/23/2009 11:51:00  _____________________________________________________________________  External Attachment:    Type:   Image     Comment:   External Document

## 2010-02-09 NOTE — Progress Notes (Signed)
Summary: Office VisitDEPRESSION SCREENNING  Office VisitDEPRESSION SCREENNING   Imported By: Arta Bruce 07/14/2009 15:37:46  _____________________________________________________________________  External Attachment:    Type:   Image     Comment:   External Document

## 2010-02-09 NOTE — Progress Notes (Signed)
Summary: ?OAB  Phone Note Call from Patient   Summary of Call: pt came in for blood work and saw our pamplet for OAB..... she filled it out and wanted you to take a look at it and see what you think? Initial call taken by: Armenia Shannon,  February 29, 2008 3:40 PM  Follow-up for Phone Call        She needs to make an appt to discuss. Interestingly, symptoms regarding this were asked and denied at her CPP Follow-up by: Julieanne Manson MD,  March 02, 2008 3:23 PM  Additional Follow-up for Phone Call Additional follow up Details #1::        left message for pt to return our call. Additional Follow-up by: Armenia Shannon,  March 03, 2008 9:17 AM    Additional Follow-up for Phone Call Additional follow up Details #2::    Spoke with pt and informed her that she would have to be seen to answer questions about OAB.... pt is scheduled 05-02-08 2 3:00 Follow-up by: Armenia Shannon,  March 04, 2008 11:35 AM

## 2010-02-09 NOTE — Assessment & Plan Note (Signed)
Summary: CPP EXAM//GK   Vital Signs:  Patient profile:   50 year old female Menstrual status:  hysterectomy Weight:      162.9 pounds BMI:     28.96 Temp:     98.0 degrees F Pulse rate:   83 / minute Pulse rhythm:   regular Resp:     20 per minute BP sitting:   152 / 87  (left arm) Cuff size:   regular  Vitals Entered By: Vesta Mixer CMA (May 19, 2009 4:01 PM) CC: CPE,  having some urinary incontince issues Is Patient Diabetic? No Pain Assessment Patient in pain? no       Does patient need assistance? Ambulation Normal   Primary Care Provider:  Julieanne Manson MD  CC:  CPE and having some urinary incontince issues.  History of Present Illness: 50 yo female here for CPP.  Concerns: 1.  Urinary incontinence:  ran out of Detrol LA as did not have money to purchase after 1 month.  Did help.  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 1.  1987:  Hysterectomy , maybe one ovary removed.  Family History: Mother, died age 45: CAD, abdominal aneurysm, HTN,  stroke, dialysis--ultimately stopped dialysis and died Father: unknown Has 6 half-sisters and 1 Half-brother. 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  Lost job when took time off to care for her mother. Lives with Peggye Pitt and his wife currently--left town when lost everything and came back in March of 2011.  Struggling with depression.  Packs/Day:  1 ppd Drug Use:  never  Review of Systems General:  Energy is poor. Eyes:  Bifocals--stable.. ENT:  Denies decreased hearing. CV:  Lot of high left chest pain for past 2 weeks.  Generally at night when lying in bed.  Every other night. Sharp pain--goes to left arm--also sharp--sometimes radiated to right arm as well.  Posterior neck and nuchal headache pain.   Pain can last for 5 minutes--gets up and sits on edge of bed.  Sometimes with some mild dyspnea.  No radiation to back.Marland Kitchen Resp:  See CV. GI:  abdominal pain as well.--upper quadrants bilaterally.  Generally when tries to hold urine.  Pt. states she is having the pain now.  Also sharp in nature.  Can last a couple of hours every day. GU:  Denies discharge and dysuria. MS:  Feels like someone is just holding her down when she is trying to get up from the bed (stays in bed most of the day). Derm:  Denies rash; black mark on right low flank.  Physical Exam  General:  Well-developed,well-nourished,in no acute distress; alert,appropriate and cooperative throughout examination Head:  Normocephalic and atraumatic without obvious abnormalities. No apparent alopecia or balding. Eyes:  No  corneal or conjunctival inflammation noted. EOMI. Perrla. Funduscopic exam benign, without hemorrhages, exudates or papilledema. Vision grossly normal. Ears:  External ear exam shows no significant lesions or deformities.  Otoscopic examination reveals clear canals, tympanic membranes are intact bilaterally without bulging, retraction, inflammation or discharge. Hearing is grossly normal bilaterally. Nose:  External nasal examination shows no deformity or inflammation. Nasal  mucosa are pink and moist without lesions or exudates. Mouth:  Oral mucosa and oropharynx without lesions or exudates.   Neck:  No deformities, masses, or tenderness noted. Chest Wall:  No deformities, masses, or tenderness noted. Breasts:  No mass, nodules, thickening, tenderness, bulging, retraction, inflamation, nipple discharge or skin changes noted.   Lungs:  Normal respiratory effort, chest expands symmetrically. Lungs are clear to auscultation, no crackles or wheezes. Heart:  Normal rate and regular rhythm. S1 and S2 normal without gallop, murmur, click, rub or other extra sounds. Abdomen:  Bowel sounds positive,abdomen soft and non-tender without masses, organomegaly or hernias noted. Rectal:  No external abnormalities noted. Normal sphincter tone. No rectal masses or tenderness.  Heme negative light brown stool Genitalia:  Pelvic Exam:        External: normal female genitalia without lesions or masses        Vagina: normal without lesions or masses        Cervix: normal without lesions or masses        Adnexa: normal bimanual exam without masses or fullness        Uterus:absent        Pap smear: not performed Msk:  No deformity or scoliosis noted of thoracic or lumbar spine.   Pulses:  R and L carotid,radial,femoral,dorsalis pedis and posterior tibial pulses are full and equal bilaterally Extremities:  No clubbing, cyanosis, edema, or deformity noted with normal full range of motion of all joints.   Neurologic:  No cranial nerve deficits noted. Station and gait are normal. Plantar reflexes are down-going bilaterally. DTRs are symmetrical throughout. Sensory, motor and coordinative functions appear intact. Skin:  Flaky 1/2 dime sized area right low back.  No erythema Cervical Nodes:  No lymphadenopathy noted Axillary Nodes:  No palpable lymphadenopathy Inguinal Nodes:  No significant adenopathy Psych:  Cognition and judgment appear intact. Alert and cooperative with normal attention  span and concentration. No apparent delusions, illusions, hallucinations   Impression & Recommendations:  Problem # 1:  ROUTINE GYNECOLOGICAL EXAMINATION (ICD-V72.31) Mammogram scheduled Orders: UA Dipstick w/o Micro (manual) (14782) T-Chlamydia  Probe, urine ([id number]) T-GC Probe, urine ([id number]) T-Syphilis Test (RPR) (84132-44010) T-HIV Antibody  (Reflex) ([id number])  Problem # 2:  DEPRESSION (ICD-311) Depression form shows more significant concern than what pt. initially discussed in ROS--will have her come back in near future to discuss. Her updated medication list for this problem includes:    Wellbutrin Sr 150 Mg Xr12h-tab (Bupropion hcl) .Marland Kitchen... 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 Apply two times a day to affected area  Complete Medication List: 1)  Detrol La 4 Mg Xr24h-cap (Tolterodine tartrate) .Marland Kitchen.. 1 cap by mouth at bedtime 2)  Clobetasol Propionate 0.05 % Crea (Clobetasol propionate) .... Apply two times a day to affected area 3)  Wellbutrin Sr 150  Mg Xr12h-tab (Bupropion hcl) .Marland Kitchen.. 1 tab by mouth in morning for 3 days, then 1 tab by mouth two times a day  Other Orders: T-Comprehensive Metabolic Panel [phone]) T-Lipid Profile ([id number]) T-CBC w/Diff ([id number]) T-TSH ([id number])  Patient Instructions: 1)  Follow up with Dr. Delrae Alfred in 2 weeks--follow up on depression, chest pain, headache  Preventive Care Screening  Prior Values:    Mammogram:  BI-RADS CATEGORY 1:  Negative.^MM DIGITAL DIAGNOSTIC BILAT (02/19/2008)    Last Tetanus Booster:  Tdap (02/15/2008)    Last Flu Shot:  Fluvax 3+ (11/24/2006)    Last Pneumovax:  Pneumovax (11/24/2006)     SBE:  Yes--some clear nipple discharge on right--only when palpating breasts--generally when tender. Osteoprevention:  Not much dairy--but does like, just gets stomach upset.  Has never tried Lactaid.  No exercise. Guaiac Cards:  never Colonoscopy:  had one many years ago for unknown reason--normal.  Prescriptions: CLOBETASOL PROPIONATE 0.05 % CREA (CLOBETASOL PROPIONATE) apply two times a day to affected area  #15g x 0   Entered and Authorized by:   Julieanne Manson MD   Signed by:   Julieanne Manson MD on 05/19/2009   Method used:   Faxed to ...       [facility] (retail)       [address] [place], [place]  [address]       Ph: [phone]       Fax: [phone]   RxID:   [id number] DETROL LA 4 MG XR24H-CAP (TOLTERODINE TARTRATE) 1 cap by mouth at bedtime  #30 x 11   Entered and Authorized by:   Julieanne Manson MD   Signed by:   Julieanne Manson MD on 05/19/2009   Method used:   Faxed to ...       [id number] (retail)       [address] [place], [place]  [address]       Ph: [phone]       Fax: [phone]   RxID:   [id number]   Laboratory Results  Date/Time Received: May 29, 2009 2:53 PM   Stool - Occult Blood Hemmoccult #1: negative Date: 05/29/2009 Hemoccult #2: negative Date: 05/29/2009 Hemoccult #3: negative Date: 05/29/2009

## 2010-02-09 NOTE — Progress Notes (Signed)
  Phone Note Outgoing Call   Summary of Call: Tiffany--can you fax my OV note to Dr. Andrez Grime think I have her numbers in Registration.  Nora--not sure if you received this PT referral Initial call taken by: Julieanne Manson MD,  August 27, 2009 1:12 AM  Follow-up for Phone Call        YES I DID PT HAVE AN APPT 09-07-09 @ 1PM [facility]  PH # [phone] ADDRESS [address] . PT AWARE OF THE APPT Follow-up by: Cheryll Dessert,  August 27, 2009 8:17 AM  Additional Follow-up for Phone Call Additional follow up Details #1::        Ov faxed to [phone]. Additional Follow-up by: Vesta Mixer CMA,  August 27, 2009 11:47 AM

## 2010-02-09 NOTE — Progress Notes (Signed)
Summary: Medication not available  Phone Note Call from Patient   Caller: Patient Summary of Call: States saw Dr. Delrae Alfred last week, prescribed Wellbutrin.  Was unable to get at [facility]. Initial call taken by: Dutch Quint RN,  Jun 09, 2009 12:56 PM  Follow-up for Phone Call        Spoke with [facility] -- stated possible eligibility hold at the time -- Rx is ready.  Spoke with pt. and advised that med is available for pick-up.  Dutch Quint RN  Jun 09, 2009 12:57 PM  Follow-up by: Dutch Quint RN,  Jun 09, 2009 12:57 PM

## 2010-02-09 NOTE — Progress Notes (Signed)
Summary: DOXYCYCLINE HYCLATE 100 MG SIDE EFFECTS   Phone Note Call from Patient   Caller: Patient Reason for Call: Talk to Doctor Summary of Call: PT WENT TO ER 08-04-09 BECAUSE OF DOXYCYCLINE HYCLATE 100 MG TABS (DOXYCYCLINE HYCLATE) . SIDE EFFECTS  FACE SWOLLEN & LIPS AND LEFT ARM TOO. PT WANTS TO LET KNOW DR MULBERRY THAT SHE IS NOT TAKING IT NO MORE . AND IN THE ER THEY GIVE HER A MED THAT HELP WITH THE SWOLLEN  Initial call taken by: Cheryll Dessert,  August 10, 2009 10:56 AM  Follow-up for Phone Call        [facility] will list this as an allergy for pt. Follow-up by: Vesta Mixer CMA,  August 10, 2009 11:26 AM  Additional Follow-up for Phone Call Additional follow up Details #1::        was she given something else to treat her infection? Tereso Newcomer PA-C  August 10, 2009 5:45 PM  She was given Bactrium per ER note. Gaylyn Cheers RN  August 11, 2009 12:22 PM     New Allergies: ! DOXYCYCLINE New Allergies: ! DOXYCYCLINE

## 2010-02-09 NOTE — Miscellaneous (Signed)
Summary: Rehab ReportEVALUATION AND TREATMENT  Rehab ReportEVALUATION AND TREATMENT   Imported By: Arta Bruce 09/09/2009 10:48:06  _____________________________________________________________________  External Attachment:    Type:   Image     Comment:   External Document

## 2010-02-09 NOTE — Assessment & Plan Note (Signed)
Summary: 1 MONTH FU ON HEADACHE & DEPRESSION/KT   Vital Signs:  Patient profile:   50 year old female Menstrual status:  hysterectomy Weight:      153.9 pounds Temp:     98.6 degrees F oral Pulse rate:   72 / minute Pulse rhythm:   regular Resp:     12 per minute BP sitting:   126 / 90  (left arm) Cuff size:   regular  Vitals Entered By: Michelle Nasuti (October 01, 2009 12:29 PM) CC: FOLLOW UP AFTER REHAB.Marland Kitchen PT STILL HAS HA DAILY Pain Assessment Patient in pain? no        Primary Care Provider:  Julieanne Manson MD  CC:  FOLLOW UP AFTER REHAB.Marland Kitchen PT STILL HAS HA DAILY.  History of Present Illness: 1.  Depression:  Taking Zoloft and feeling significantly better now.  Not clear if taking Clonazepam now.  Pt.   states she is taking 3 tabs in morning--suspect must still be taking Clonazepam.  May also be taking at night.  Not clear if no longer taking Trazodone as well, though she initially states that she stopped taking it completely and now cannot sleep at night.  Goes to bed between 7 and 8 p.m. and wakes around 1 a.m. to urinate and cannot get back to sleep.  Napping during the day--only 1 hour now.  Arises around 8 or 9 a.m. in the morning.  Is cleaning up in the home.  Doing more around the house.  Did go to a football game with her son and other family members.  Did enjoy.    2.  Headaches:  about 50 % better.  Does have an exercise routine to perform  at home.  Pt. has one more visit with her (PT) tomorrow.  Current Medications (verified): 1)  Detrol La 4 Mg Xr24h-Cap (Tolterodine Tartrate) .Marland Kitchen.. 1 Cap By Mouth At Bedtime 2)  Clobetasol Propionate 0.05 % Crea (Clobetasol Propionate) .... Apply Two Times A Day To Affected Area 3)  Wellbutrin Sr 150 Mg Xr12h-Tab (Bupropion Hcl) .Marland Kitchen.. 1 Tab By Mouth in Morning --Dr. Migdalia Dk 4)  Trazodone Hcl 100 Mg Tabs (Trazodone Hcl) .... 1/2 To 1 By Mouth At Bedtime As Needed Insomnia  Dr. Inda Merlin 5)  Clonazepam 1 Mg Tabs  (Clonazepam) .... 1/2 Q Am and 1/2 Q Pm As Needed Anxiety.  Dr. Inda Merlin 6)  Doxycycline Hyclate 100 Mg Tabs (Doxycycline Hyclate) .Marland Kitchen.. 1 Tab By Mouth Two Times A Day With Meals  Allergies (verified): 1)  ! Doxycycline  Physical Exam  General:  NAD, Much less emotional today.  Able to answer questions much more clearly--not so sedated appearing. Eyes:  No corneal or conjunctival inflammation noted. EOMI. Perrla. Funduscopic exam benign, without hemorrhages, exudates or papilledema. Vision grossly normal. Neck:  No deformities, masses, or tenderness noted. Neurologic:  alert & oriented X3 and cranial nerves II-XII intact.     Impression & Recommendations:  Problem # 1:  HEADACHE (ICD-784.0) Improved with better control of depression and PT  Problem # 2:  DEPRESSION (ICD-311) Improved. The following medications were removed from the medication list:    Clonazepam 1 Mg Tabs (Clonazepam) .Marland Kitchen... 1/2 q am and 1/2 q pm as needed anxiety.  dr. Inda Merlin Her updated medication list for this problem includes:    Wellbutrin Sr 150 Mg Xr12h-tab (Bupropion hcl) .Marland Kitchen... 1 tab by mouth in morning --dr. Migdalia Dk    Trazodone Hcl 100 Mg Tabs (Trazodone hcl) .Marland Kitchen... 1/2 to 1 by  mouth at bedtime as needed insomnia  dr. Inda Merlin    Zoloft 50 Mg Tabs (Sertraline hcl) .Marland Kitchen... 1 tab by mouth daily--psych  Complete Medication List: 1)  Detrol La 4 Mg Xr24h-cap (Tolterodine tartrate) .Marland Kitchen.. 1 cap by mouth at bedtime 2)  Clobetasol Propionate 0.05 % Crea (Clobetasol propionate) .... Apply two times a day to affected area 3)  Wellbutrin Sr 150 Mg Xr12h-tab (Bupropion hcl) .Marland Kitchen.. 1 tab by mouth in morning --dr. Elon Jester hickman 4)  Trazodone Hcl 100 Mg Tabs (Trazodone hcl) .... 1/2 to 1 by mouth at bedtime as needed insomnia  dr. Inda Merlin 5)  Zoloft 50 Mg Tabs (Sertraline hcl) .Marland Kitchen.. 1 tab by mouth daily--psych  Patient Instructions: 1)  Follow up with Dr. Delrae Alfred in 3 months --depression 2)  STOP Klonopin. 3)  May  restart Trazodone for bedtime 4)  Get out of bed and read if unable to get back to sleep in 15 - 20 minutes.

## 2010-02-15 ENCOUNTER — Encounter (INDEPENDENT_AMBULATORY_CARE_PROVIDER_SITE_OTHER): Payer: Self-pay | Admitting: Internal Medicine

## 2010-02-25 NOTE — Letter (Signed)
Summary: MAILED REQUESTED RECORDS TO HEARING & APPEALS  MAILED REQUESTED RECORDS TO HEARING & APPEALS   Imported By: Arta Bruce 02/15/2010 15:11:37  _____________________________________________________________________  External Attachment:    Type:   Image     Comment:   External Document

## 2010-03-09 NOTE — Assessment & Plan Note (Signed)
Summary: HEADACHES NO BETTER///KT   Vital Signs:  Patient profile:   50 year old female Menstrual status:  hysterectomy Weight:      159.06 pounds Temp:     97.6 degrees F oral Pulse rate:   78 / minute Pulse rhythm:   regular Resp:     16 per minute BP sitting:   100 / 68  (left arm) Cuff size:   regular  Vitals Entered By: Hale Drone CMA (February 04, 2010 10:54 AM) CC: Knot on right breast x2 weeks. HA's that go off and on.  Is Patient Diabetic? No Pain Assessment Patient in pain? yes     Location: right best Intensity: 7 Type: throbbing Onset of pain  Intermittent  Does patient need assistance? Functional Status Self care Ambulation Normal   Primary Care Provider:  Julieanne Manson MD  CC:  Knot on right breast x2 weeks. HA's that go off and on. .  History of Present Illness: 1.  Depression:  Still seeing both Dr. Inda Merlin and counselor, Romeo Apple.  Feels like she is doing much better.    2.  Insomnia:  Still a problem--still lying in bed in bedroom watching TV and sleeping on and off, then cannot sleep at night.  3.  Headache:  Previously, doing better, but since stopped PT, has returned to higher levels of pain and frequency.  Mainly nuchal bilaterally  and above left eye as before.  Does have home exercises, but not performing every day.    4.  Getting sores under breasts mainly.  No definite pustule or furuncle.  scratches until open and draining.  Current Medications (verified): 1)  Detrol La 4 Mg Xr24h-Cap (Tolterodine Tartrate) .Marland Kitchen.. 1 Cap By Mouth At Bedtime 2)  Clobetasol Propionate 0.05 % Crea (Clobetasol Propionate) .... Apply Two Times A Day To Affected Area 3)  Wellbutrin Sr 150 Mg Xr12h-Tab (Bupropion Hcl) .Marland Kitchen.. 1 Tab By Mouth in Morning --Dr. Migdalia Dk 4)  Trazodone Hcl 100 Mg Tabs (Trazodone Hcl) .... 1/2 To 1 By Mouth At Bedtime As Needed Insomnia  Dr. Inda Merlin 5)  Zoloft 50 Mg Tabs (Sertraline Hcl) .Marland Kitchen.. 1 Tab By Mouth Daily--Psych  Allergies  (verified): 1)  ! Doxycycline  Physical Exam  General:  Makeup  on, dressed neatly, smiling, not crying any longer Eyes:  No corneal or conjunctival inflammation noted. EOMI. Perrla. Funduscopic exam benign, without hemorrhages, exudates or papilledema. Vision grossly normal. Neck:  Very tender over bilateral traps to nuchal ridge Skin:  one 5 mm open sore under left breast, no pustular drainage.  No other active lesions   Impression & Recommendations:  Problem # 1:  HEADACHE (ICD-784.0) Pt. wants to try to get restarted on PT home exercises two times a day  If no improvement in 1 month, will call and we can send back to PT.  Problem # 2:  DEPRESSION (ICD-311) Much improved Discussed needs to work on getting involved in life outside of home now. To make a list of goals Her updated medication list for this problem includes:    Wellbutrin Sr 150 Mg Xr12h-tab (Bupropion hcl) .Marland Kitchen... 1 tab by mouth in morning --dr. Migdalia Dk    Trazodone Hcl 100 Mg Tabs (Trazodone hcl) .Marland Kitchen... 2 tabs  by mouth at bedtime as needed insomnia  dr. Inda Merlin    Zoloft 50 Mg Tabs (Sertraline hcl) .Marland Kitchen... 1 tab by mouth daily--psych  Problem # 3:  INSOMNIA (ICD-780.52) to work on getting out of bed--plan activities to do so  Problem # 4:  CARBUNCLE AND FURUNCLE OF TRUNK (ICD-680.2) Suspect these are furuncles To apply moist heat to bring to head and drain. Use otc antibiotic ointment two times a day on current healing lesion  Complete Medication List: 1)  Detrol La 4 Mg Xr24h-cap (Tolterodine tartrate) .Marland Kitchen.. 1 cap by mouth at bedtime 2)  Clobetasol Propionate 0.05 % Crea (Clobetasol propionate) .... Apply two times a day to affected area 3)  Wellbutrin Sr 150 Mg Xr12h-tab (Bupropion hcl) .Marland Kitchen.. 1 tab by mouth in morning --dr. Elon Jester hickman 4)  Trazodone Hcl 100 Mg Tabs (Trazodone hcl) .... 2 tabs  by mouth at bedtime as needed insomnia  dr. Inda Merlin 5)  Zoloft 50 Mg Tabs (Sertraline hcl) .Marland Kitchen.. 1 tab by mouth  daily--psych  Other Orders: Flu Vaccine 35yrs + (40981) Admin 1st Vaccine (19147)  Patient Instructions: 1)  CPP with Dr. Delrae Alfred in mid May to June 2)  Get TV out of bedroom 3)  Find something to get you out of your home every morning. 4)  Get over the counter antibiotic ointment to treat the sore on your chest two times a day for now.   Orders Added: 1)  Flu Vaccine 60yrs + [90658] 2)  Admin 1st Vaccine [90471] 3)  Est. Patient Level IV [82956]   Immunizations Administered:  Influenza Vaccine # 1:    Vaccine Type: Fluvax 3+    Site: left deltoid    Mfr: GlaxoSmithKline    Dose: 0.5 ml    Route: IM    Given by: Hale Drone CMA    Exp. Date: 07/10/2010    Lot #: [id number]    VIS given: 08/04/09 version given February 04, 2010.  Flu Vaccine Consent Questions:    Do you have a history of severe allergic reactions to this vaccine? no    Any prior history of allergic reactions to egg and/or gelatin? no    Do you have a sensitivity to the preservative Thimersol? no    Do you have a past history of Guillan-Barre Syndrome? no    Do you currently have an acute febrile illness? no    Have you ever had a severe reaction to latex? no    Vaccine information given and explained to patient? yes    Are you currently pregnant? no   Immunizations Administered:  Influenza Vaccine # 1:    Vaccine Type: Fluvax 3+    Site: left deltoid    Mfr: GlaxoSmithKline    Dose: 0.5 ml    Route: IM    Given by: Hale Drone CMA    Exp. Date: 07/10/2010    Lot #: [id number]    VIS given: 08/04/09 version given February 04, 2010.

## 2010-03-27 LAB — POCT CARDIAC MARKERS: Troponin i, poc: <0.05 ng/mL (ref 0.00–0.09)

## 2010-03-31 LAB — WOUND CULTURE

## 2010-04-22 LAB — URINALYSIS, ROUTINE W REFLEX MICROSCOPIC
Glucose, UA: NEGATIVE mg/dL
Ketones, ur: NEGATIVE mg/dL
Nitrite: NEGATIVE
Protein, ur: NEGATIVE mg/dL
pH: 5.5 (ref 5.0–8.0)

## 2010-04-22 LAB — WET PREP, GENITAL: Yeast Wet Prep HPF POC: NONE SEEN

## 2010-04-22 LAB — URINE MICROSCOPIC-ADD ON

## 2010-04-22 LAB — GC/CHLAMYDIA PROBE AMP, URINE
Chlamydia, Swab/Urine, PCR: NEGATIVE
GC Probe Amp, Urine: NEGATIVE

## 2010-04-22 LAB — PREGNANCY, URINE: Preg Test, Ur: NEGATIVE

## 2010-05-25 ENCOUNTER — Other Ambulatory Visit (HOSPITAL_COMMUNITY): Payer: Self-pay | Admitting: Family Medicine

## 2010-05-25 DIAGNOSIS — Z1231 Encounter for screening mammogram for malignant neoplasm of breast: Secondary | ICD-10-CM

## 2010-05-28 ENCOUNTER — Emergency Department (HOSPITAL_COMMUNITY)
Admission: EM | Admit: 2010-05-28 | Discharge: 2010-05-28 | Disposition: A | Discharge status: Home / Self Care | Payer: Self-pay | Attending: Emergency Medicine | Admitting: Emergency Medicine

## 2010-05-28 DIAGNOSIS — L298 Other pruritus: Secondary | ICD-10-CM | POA: Insufficient documentation

## 2010-05-28 DIAGNOSIS — T148 Other injury of unspecified body region: Secondary | ICD-10-CM | POA: Insufficient documentation

## 2010-05-28 DIAGNOSIS — W57XXXA Bitten or stung by nonvenomous insect and other nonvenomous arthropods, initial encounter: Secondary | ICD-10-CM | POA: Insufficient documentation

## 2010-05-28 DIAGNOSIS — L2989 Other pruritus: Secondary | ICD-10-CM | POA: Insufficient documentation

## 2010-06-09 ENCOUNTER — Ambulatory Visit (HOSPITAL_COMMUNITY)
Admission: RE | Admit: 2010-06-09 | Discharge: 2010-06-09 | Disposition: A | Discharge status: Home / Self Care | Payer: Self-pay | Source: Ambulatory Visit | Attending: Family Medicine | Admitting: Family Medicine

## 2010-06-09 DIAGNOSIS — Z1231 Encounter for screening mammogram for malignant neoplasm of breast: Secondary | ICD-10-CM | POA: Insufficient documentation

## 2010-07-02 ENCOUNTER — Emergency Department (HOSPITAL_COMMUNITY): Payer: Self-pay

## 2010-07-02 ENCOUNTER — Emergency Department (HOSPITAL_COMMUNITY)
Admission: EM | Admit: 2010-07-02 | Discharge: 2010-07-02 | Disposition: A | Discharge status: Home / Self Care | Payer: Self-pay | Attending: Emergency Medicine | Admitting: Emergency Medicine

## 2010-07-02 DIAGNOSIS — T148XXA Other injury of unspecified body region, initial encounter: Secondary | ICD-10-CM | POA: Insufficient documentation

## 2010-07-02 DIAGNOSIS — Z79899 Other long term (current) drug therapy: Secondary | ICD-10-CM | POA: Insufficient documentation

## 2010-07-02 DIAGNOSIS — R079 Chest pain, unspecified: Secondary | ICD-10-CM | POA: Insufficient documentation

## 2010-07-02 DIAGNOSIS — R0989 Other specified symptoms and signs involving the circulatory and respiratory systems: Secondary | ICD-10-CM | POA: Insufficient documentation

## 2010-07-02 DIAGNOSIS — M79609 Pain in unspecified limb: Secondary | ICD-10-CM | POA: Insufficient documentation

## 2010-07-02 DIAGNOSIS — R0609 Other forms of dyspnea: Secondary | ICD-10-CM | POA: Insufficient documentation

## 2010-07-02 DIAGNOSIS — M546 Pain in thoracic spine: Secondary | ICD-10-CM | POA: Insufficient documentation

## 2010-07-02 DIAGNOSIS — X58XXXA Exposure to other specified factors, initial encounter: Secondary | ICD-10-CM | POA: Insufficient documentation

## 2010-09-16 ENCOUNTER — Emergency Department (HOSPITAL_COMMUNITY)
Admission: EM | Admit: 2010-09-16 | Discharge: 2010-09-16 | Disposition: A | Discharge status: Home / Self Care | Payer: Self-pay | Attending: Emergency Medicine | Admitting: Emergency Medicine

## 2010-09-16 ENCOUNTER — Emergency Department (HOSPITAL_COMMUNITY): Payer: Self-pay

## 2010-09-16 DIAGNOSIS — R110 Nausea: Secondary | ICD-10-CM | POA: Insufficient documentation

## 2010-09-16 DIAGNOSIS — H53149 Visual discomfort, unspecified: Secondary | ICD-10-CM | POA: Insufficient documentation

## 2010-09-16 DIAGNOSIS — H538 Other visual disturbances: Secondary | ICD-10-CM | POA: Insufficient documentation

## 2010-09-16 DIAGNOSIS — L989 Disorder of the skin and subcutaneous tissue, unspecified: Secondary | ICD-10-CM | POA: Insufficient documentation

## 2010-09-16 DIAGNOSIS — R42 Dizziness and giddiness: Secondary | ICD-10-CM | POA: Insufficient documentation

## 2010-09-16 DIAGNOSIS — R51 Headache: Secondary | ICD-10-CM | POA: Insufficient documentation

## 2010-10-04 ENCOUNTER — Other Ambulatory Visit (HOSPITAL_COMMUNITY): Payer: Self-pay | Admitting: Family Medicine

## 2010-10-04 DIAGNOSIS — R58 Hemorrhage, not elsewhere classified: Secondary | ICD-10-CM

## 2010-10-07 ENCOUNTER — Ambulatory Visit (HOSPITAL_COMMUNITY)
Admission: RE | Admit: 2010-10-07 | Discharge: 2010-10-07 | Disposition: A | Discharge status: Home / Self Care | Payer: Self-pay | Source: Ambulatory Visit | Attending: Family Medicine | Admitting: Family Medicine

## 2010-10-07 ENCOUNTER — Other Ambulatory Visit: Payer: Self-pay | Admitting: Family Medicine

## 2010-10-07 DIAGNOSIS — N898 Other specified noninflammatory disorders of vagina: Secondary | ICD-10-CM | POA: Insufficient documentation

## 2010-10-07 DIAGNOSIS — R58 Hemorrhage, not elsewhere classified: Secondary | ICD-10-CM

## 2010-10-07 DIAGNOSIS — Z90710 Acquired absence of both cervix and uterus: Secondary | ICD-10-CM | POA: Insufficient documentation

## 2010-10-08 LAB — CBC
HCT: 39.7
MCHC: 33
MCV: 92.4
Platelets: 265
RBC: 4.3
WBC: 8.3

## 2010-10-08 LAB — DIFFERENTIAL
Basophils Absolute: 0.1
Lymphocytes Relative: 46
Lymphs Abs: 3.8
Neutro Abs: 3.6

## 2010-10-08 LAB — COMPREHENSIVE METABOLIC PANEL
BUN: 11
CO2: 26
Calcium: 9.6
Chloride: 107
Creatinine, Ser: 1.36 — ABNORMAL HIGH
GFR calc Af Amer: 51 — ABNORMAL LOW
GFR calc non Af Amer: 42 — ABNORMAL LOW
Total Bilirubin: 0.5

## 2010-10-08 LAB — POCT I-STAT, CHEM 8
Calcium, Ion: 1.2
Chloride: 110
Glucose, Bld: 92
HCT: 42
TCO2: 27

## 2010-10-08 LAB — POCT CARDIAC MARKERS: Myoglobin, poc: 48.4

## 2010-10-08 LAB — LIPASE, BLOOD: Lipase: 28

## 2010-12-17 ENCOUNTER — Encounter: Payer: Self-pay | Admitting: Obstetrics & Gynecology

## 2011-03-02 ENCOUNTER — Encounter (HOSPITAL_COMMUNITY): Payer: Self-pay | Admitting: *Deleted

## 2011-03-02 ENCOUNTER — Emergency Department (HOSPITAL_COMMUNITY)
Admission: EM | Admit: 2011-03-02 | Discharge: 2011-03-02 | Disposition: A | Discharge status: Home / Self Care | Payer: Self-pay | Source: Home / Self Care | Attending: Emergency Medicine | Admitting: Emergency Medicine

## 2011-03-02 DIAGNOSIS — L03119 Cellulitis of unspecified part of limb: Secondary | ICD-10-CM

## 2011-03-02 DIAGNOSIS — L03115 Cellulitis of right lower limb: Secondary | ICD-10-CM

## 2011-03-02 HISTORY — DX: Depression, unspecified: F32.A

## 2011-03-02 HISTORY — DX: Major depressive disorder, single episode, unspecified: F32.9

## 2011-03-02 HISTORY — DX: Methicillin resistant Staphylococcus aureus infection, unspecified site: A49.02

## 2011-03-02 MED ORDER — SULFAMETHOXAZOLE-TRIMETHOPRIM 800-160 MG PO TABS: 1.0000 | ORAL_TABLET | Freq: Two times a day (BID) | ORAL | Status: AC

## 2011-03-02 MED ORDER — MUPIROCIN 2 % EX OINT: TOPICAL_OINTMENT | Freq: Three times a day (TID) | CUTANEOUS | Status: AC

## 2011-03-02 NOTE — ED Notes (Signed)
Pt  Has  A  Draining  Lesion  r  Upper  Thigh  Which  She  Reports        She  Has  Had  For  Several  Weeks       denys  Any other  Symptoms  However  She  Reports  Tenderness  To the  Area  evident

## 2011-03-02 NOTE — ED Provider Notes (Signed)
He History     CSN: [id number]  Arrival date & time 03/02/11  1746   First MD Initiated Contact with Patient 03/02/11 1853      Chief Complaint  Patient presents with  . Recurrent Skin Infections    (Consider location/radiation/quality/duration/timing/severity/associated sxs/prior treatment) HPI Comments: Patient reports a tender, erythematous area on her right upper thigh starting several weeks ago. States this started after she was visiting her mother in Hospital who has MRSA. States it started draining spontaneously several weeks ago. Patient states that she's using warm compresses with some relief, however, is concerned because it is not healing.  No nausea, vomiting, fevers, erythema streaking up the leg.Marland Kitchen Has a history of recurrent skin abscesses. Patient with no history of diabetes  Patient is a 51 y.o. female presenting with abscess. The history is provided by the patient. No language interpreter was used.  Abscess  This is a recurrent problem. The current episode started more than one week ago. The onset was sudden. The abscess is present on the right upper leg. The abscess is characterized by redness, painfulness and draining. It is unknown what she was exposed to. The abscess first occurred at home. Pertinent negatives include no fever. She has received no recent medical care.    Past Medical History  Diagnosis Date  . Depression   . MRSA (methicillin resistant Staphylococcus aureus)     Past Surgical History  Procedure Date  . Abdominal hysterectomy     History reviewed. No pertinent family history.  History  Substance Use Topics  . Smoking status: Current Everyday Smoker  . Smokeless tobacco: Not on file  . Alcohol Use: No    OB History    Grav Para Term Preterm Abortions TAB SAB Ect Mult Living                  Review of Systems  Constitutional: Negative for fever.  Gastrointestinal: Negative for nausea.  Skin: Positive for color change and wound.     Allergies  Doxycycline  Home Medications   Current Outpatient Rx  Name Route Sig Dispense Refill  . BENZTROPINE MESYLATE 0.5 MG PO TABS Oral Take 0.5 mg by mouth 2 (two) times daily.    Marland Kitchen HALOPERIDOL 1 MG PO TABS Oral Take 3 mg by mouth 2 (two) times daily.    . SERTRALINE HCL 25 MG PO TABS Oral Take 15 mg by mouth daily.    Marland Kitchen MUPIROCIN 2 % EX OINT Topical Apply topically 3 (three) times daily. Apply after warm soak for 10 minutes 22 g 0  . SULFAMETHOXAZOLE-TRIMETHOPRIM 800-160 MG PO TABS Oral Take 1 tablet by mouth 2 (two) times daily. 20 tablet 0    BP 124/77  Pulse 106  Temp(Src) 98.2 F (36.8 C) (Oral)  Resp 16  SpO2 98%  Physical Exam  Nursing note and vitals reviewed. Constitutional: She is oriented to person, place, and time. She appears well-developed and well-nourished. No distress.  HENT:  Head: Normocephalic and atraumatic.  Eyes: Conjunctivae and EOM are normal.  Neck: Normal range of motion.  Cardiovascular: Normal rate.   Pulmonary/Chest: Effort normal.  Abdominal: She exhibits no distension.  Musculoskeletal: Normal range of motion.       Legs:       No redness streaking up leg. No inguinal lymphadenopathy.  Neurological: She is alert and oriented to person, place, and time.  Skin: Skin is warm and dry.  Psychiatric: She has a normal mood and affect. Her behavior  is normal. Judgment and thought content normal.    ED Course  Procedures (including critical care time)  Labs Reviewed - No data to display No results found.   1. Cellulitis of right thigh       MDM  Previous records reviewed. H/o mrsa 04/15/09.    Area of erythema 3 x 2 cm. Central hole. No expressible drainage. Marked this with a solid line with a permanent marker. Drew a dotted line around area of hyperpigmentation, measured approximately 9 x 7 cm. Will send her home with Bactroban and Bactrim, as patient most likely acquired this in the hospital when she was visiting her mother.  No fevers, inguinal lymphadenopathy, lymphangitis, other systemic signs of infection. Will have her continue warm compresses.  Luiz Blare, MD 03/03/11 [phone]

## 2011-03-02 NOTE — Discharge Instructions (Signed)
Return here or follow up with your doctor in 3 days for a wound check. Finish the antibiotics even if you feel better. Continue the warm compresses. Start taking 600 mg of motrin and 1 gram of tylenol with it as needed for pain. This is an effective combination for pain.   Return sooner if you have a fever >100.4, if you get worse, or any other concerns.

## 2012-01-10 ENCOUNTER — Encounter (HOSPITAL_COMMUNITY): Payer: Self-pay | Admitting: *Deleted

## 2012-01-10 ENCOUNTER — Emergency Department (HOSPITAL_COMMUNITY)
Admission: EM | Admit: 2012-01-10 | Discharge: 2012-01-10 | Disposition: A | Discharge status: Home / Self Care | Payer: Medicare Other | Attending: Emergency Medicine | Admitting: Emergency Medicine

## 2012-01-10 DIAGNOSIS — F17200 Nicotine dependence, unspecified, uncomplicated: Secondary | ICD-10-CM | POA: Insufficient documentation

## 2012-01-10 DIAGNOSIS — Z90710 Acquired absence of both cervix and uterus: Secondary | ICD-10-CM | POA: Insufficient documentation

## 2012-01-10 DIAGNOSIS — Z8614 Personal history of Methicillin resistant Staphylococcus aureus infection: Secondary | ICD-10-CM | POA: Insufficient documentation

## 2012-01-10 DIAGNOSIS — Z79899 Other long term (current) drug therapy: Secondary | ICD-10-CM | POA: Insufficient documentation

## 2012-01-10 DIAGNOSIS — F3289 Other specified depressive episodes: Secondary | ICD-10-CM | POA: Insufficient documentation

## 2012-01-10 DIAGNOSIS — R079 Chest pain, unspecified: Secondary | ICD-10-CM | POA: Insufficient documentation

## 2012-01-10 DIAGNOSIS — M62838 Other muscle spasm: Secondary | ICD-10-CM | POA: Insufficient documentation

## 2012-01-10 DIAGNOSIS — M549 Dorsalgia, unspecified: Secondary | ICD-10-CM | POA: Insufficient documentation

## 2012-01-10 DIAGNOSIS — F329 Major depressive disorder, single episode, unspecified: Secondary | ICD-10-CM | POA: Insufficient documentation

## 2012-01-10 MED ORDER — KETOROLAC TROMETHAMINE 60 MG/2ML IM SOLN
60.0000 mg | Freq: Once | INTRAMUSCULAR | Status: AC
  Administered 2012-01-10: 60 mg via INTRAMUSCULAR
  Filled 2012-01-10: qty 2

## 2012-01-10 MED ORDER — IBUPROFEN 800 MG PO TABS: 800.0000 mg | ORAL_TABLET | Freq: Three times a day (TID) | ORAL | Status: DC

## 2012-01-10 MED ORDER — CYCLOBENZAPRINE HCL 10 MG PO TABS: 10.0000 mg | ORAL_TABLET | Freq: Two times a day (BID) | ORAL | Status: DC | PRN

## 2012-01-10 MED ORDER — CYCLOBENZAPRINE HCL 10 MG PO TABS
10.0000 mg | ORAL_TABLET | Freq: Once | ORAL | Status: AC
  Administered 2012-01-10: 10 mg via ORAL
  Filled 2012-01-10 (×2): qty 1

## 2012-01-10 NOTE — ED Notes (Signed)
x2 days of chest wall pain, with inspiration, then radiating to the neck, and across back. Take nothing for pain.

## 2012-01-10 NOTE — ED Notes (Signed)
Pt is here with pain in neck shoulders and chest.  Denies injury.  Pt states it started last nite

## 2012-01-10 NOTE — ED Provider Notes (Signed)
History     CSN: [id number]  Arrival date & time 01/10/12  0865   First MD Initiated Contact with Patient 01/10/12 [phone]      Chief Complaint  Patient presents with  . Neck Pain    (Consider location/radiation/quality/duration/timing/severity/associated sxs/prior treatment) Patient is a 51 y.o. female presenting with neck pain. The history is provided by the patient.  Neck Pain  This is a recurrent problem. The current episode started yesterday. The pain is associated with nothing. There has been no fever. Pertinent negatives include no numbness. Associated symptoms comments: Recurrent muscular pain in anterior and posterior neck, upper chest and upper back bilaterally. No injury. She reports having the same symptoms in the past. Worse with movement with sharp stabbing pain intermittently. She reports past diagnosis as muscle spasm. No SOB, cough, fever, N, V. .    Past Medical History  Diagnosis Date  . Depression   . MRSA (methicillin resistant Staphylococcus aureus)     Past Surgical History  Procedure Date  . Abdominal hysterectomy     No family history on file.  History  Substance Use Topics  . Smoking status: Current Every Day Smoker  . Smokeless tobacco: Not on file  . Alcohol Use: No    OB History    Grav Para Term Preterm Abortions TAB SAB Ect Mult Living                  Review of Systems  Constitutional: Negative for fever and chills.  HENT: Positive for neck pain.   Respiratory: Negative.   Cardiovascular: Negative.   Gastrointestinal: Negative.   Musculoskeletal: Positive for myalgias. Negative for arthralgias.       See HPI.  Skin: Negative.   Neurological: Negative.  Negative for numbness.    Allergies  Doxycycline  Home Medications   Current Outpatient Rx  Name  Route  Sig  Dispense  Refill  . QUETIAPINE FUMARATE ER 300 MG PO TB24   Oral   Take 300 mg by mouth daily with supper.           BP 147/92  Pulse 82  Temp 98 F (36.7  C) (Oral)  Resp 18  SpO2 100%  Physical Exam  Constitutional: She is oriented to person, place, and time. She appears well-developed and well-nourished.  HENT:  Head: Normocephalic.  Neck: Normal range of motion. Neck supple.  Cardiovascular: Normal rate and regular rhythm.   Pulmonary/Chest: Effort normal and breath sounds normal.  Abdominal: Soft. Bowel sounds are normal. There is no tenderness. There is no rebound and no guarding.  Musculoskeletal: Normal range of motion.       Tenderness to musculature of neck circumferentially as well as to bilateral trapezius and chest. No swelling, discoloration. FROM UE's and neck. No meningismus.  Neurological: She is alert and oriented to person, place, and time.  Skin: Skin is warm and dry. No rash noted.  Psychiatric: She has a normal mood and affect.    ED Course  Procedures (including critical care time)  Labs Reviewed - No data to display No results found.   No diagnosis found.  1. Muscular pain   MDM  Recurrent pain that seems musculoskeletal without symptoms concerning for CAD or pulmonary conditions.        Arnoldo Hooker, PA-C 01/10/12 1100

## 2012-01-10 NOTE — ED Notes (Signed)
Pt states hurts with movement

## 2012-01-11 NOTE — ED Provider Notes (Signed)
Medical screening examination/treatment/procedure(s) were performed by non-physician practitioner and as supervising physician I was immediately available for consultation/collaboration.  Sunnie Nielsen, MD 01/11/12 [phone]

## 2012-02-08 ENCOUNTER — Encounter (HOSPITAL_COMMUNITY): Payer: Self-pay

## 2012-02-08 ENCOUNTER — Emergency Department (INDEPENDENT_AMBULATORY_CARE_PROVIDER_SITE_OTHER): Payer: Medicare Other

## 2012-02-08 ENCOUNTER — Emergency Department (INDEPENDENT_AMBULATORY_CARE_PROVIDER_SITE_OTHER)
Admission: EM | Admit: 2012-02-08 | Discharge: 2012-02-08 | Disposition: A | Discharge status: Home / Self Care | Payer: Medicare Other | Source: Home / Self Care | Attending: Family Medicine | Admitting: Family Medicine

## 2012-02-08 DIAGNOSIS — Z23 Encounter for immunization: Secondary | ICD-10-CM

## 2012-02-08 DIAGNOSIS — M7500 Adhesive capsulitis of unspecified shoulder: Secondary | ICD-10-CM

## 2012-02-08 DIAGNOSIS — F329 Major depressive disorder, single episode, unspecified: Secondary | ICD-10-CM

## 2012-02-08 DIAGNOSIS — E785 Hyperlipidemia, unspecified: Secondary | ICD-10-CM

## 2012-02-08 DIAGNOSIS — I10 Essential (primary) hypertension: Secondary | ICD-10-CM

## 2012-02-08 DIAGNOSIS — R5381 Other malaise: Secondary | ICD-10-CM

## 2012-02-08 DIAGNOSIS — R109 Unspecified abdominal pain: Secondary | ICD-10-CM

## 2012-02-08 DIAGNOSIS — R5383 Other fatigue: Secondary | ICD-10-CM

## 2012-02-08 DIAGNOSIS — M25519 Pain in unspecified shoulder: Secondary | ICD-10-CM

## 2012-02-08 DIAGNOSIS — F17200 Nicotine dependence, unspecified, uncomplicated: Secondary | ICD-10-CM

## 2012-02-08 LAB — COMPREHENSIVE METABOLIC PANEL
ALT: 24 U/L (ref 0–35)
AST: 31 U/L (ref 0–37)
Alkaline Phosphatase: 127 U/L — ABNORMAL HIGH (ref 39–117)
CO2: 26 mEq/L (ref 19–32)
Chloride: 105 mEq/L (ref 96–112)
Creatinine, Ser: 0.98 mg/dL (ref 0.50–1.10)
GFR calc non Af Amer: 67 mL/min — ABNORMAL LOW (ref >90)
Sodium: 143 mEq/L (ref 135–145)
Total Bilirubin: 0.2 mg/dL — ABNORMAL LOW (ref 0.3–1.2)

## 2012-02-08 LAB — POCT URINALYSIS DIP (DEVICE)
Ketones, ur: NEGATIVE mg/dL
Protein, ur: NEGATIVE mg/dL
Specific Gravity, Urine: 1.02 (ref 1.005–1.030)
pH: 5.5 (ref 5.0–8.0)

## 2012-02-08 LAB — CBC
MCV: 88.1 fL (ref 78.0–100.0)
Platelets: 247 10*3/uL (ref 150–400)
RBC: 4.53 MIL/uL (ref 3.87–5.11)
WBC: 9.1 10*3/uL (ref 4.0–10.5)

## 2012-02-08 MED ORDER — HYDROCHLOROTHIAZIDE 12.5 MG PO TABS: 12.5000 mg | ORAL_TABLET | Freq: Every day | ORAL | Status: DC

## 2012-02-08 MED ORDER — INFLUENZA VIRUS VACC SPLIT PF IM SUSP
0.5000 mL | Freq: Once | INTRAMUSCULAR | Status: AC
  Administered 2012-02-08: 0.5 mL via INTRAMUSCULAR

## 2012-02-08 NOTE — ED Provider Notes (Signed)
History    CSN: [id number]  Arrival date & time 02/08/12  1053   First MD Initiated Contact with Patient 02/08/12 1104     Chief Complaint  Patient presents with  . Shoulder Pain   The history is provided by the patient.   Pt has untreated HTN,  She has been seeing a psychiatrist but has not followed primary care physician in over 1 year.  Pt says that she does well with seroquel qhs.  No other problems today.  Pt requesting flu vaccine.  Pt reports that she is going to see her psychiatrist tomorrow.  Pt complaining of chronic right shoulder pain and loss of mobility of right shoulder from pain.  This is from an injury that occurred 1 year ago where pt fell on her right shoulder.  Pt says that ibuprofen 800 mg is not helping that much any more.  She denies loss of strength in right arm but loss of mobility of right shoulder is present and not sleeping well at night because of pain in shoulder.  No paresthesias reported.    Past Medical History  Diagnosis Date  . Depression   . MRSA (methicillin resistant Staphylococcus aureus)     Past Surgical History  Procedure Date  . Abdominal hysterectomy    No family history on file.  History  Substance Use Topics  . Smoking status: Current Every Day Smoker  . Smokeless tobacco: Not on file  . Alcohol Use: No   OB History    Grav Para Term Preterm Abortions TAB SAB Ect Mult Living                 Review of Systems  Genitourinary: Positive for frequency.       Nocturia   Musculoskeletal: Positive for arthralgias.       Right shoulder pain   All other systems reviewed and are negative.   Allergies  Doxycycline  Home Medications   Current Outpatient Rx  Name  Route  Sig  Dispense  Refill  . QUETIAPINE FUMARATE 400 MG PO TABS   Oral   Take 400 mg by mouth at bedtime.         . CYCLOBENZAPRINE HCL 10 MG PO TABS   Oral   Take 1 tablet (10 mg total) by mouth 2 (two) times daily as needed for muscle spasms.   20 tablet  0   . IBUPROFEN 800 MG PO TABS   Oral   Take 1 tablet (800 mg total) by mouth 3 (three) times daily.   21 tablet   0   . QUETIAPINE FUMARATE ER 300 MG PO TB24   Oral   Take 400 mg by mouth daily with supper.           BP 144/93  Pulse 95  Temp 97.7 F (36.5 C) (Oral)  Resp 16  SpO2 99%  Physical Exam  Nursing note and vitals reviewed. Constitutional: She is oriented to person, place, and time. She appears well-developed and well-nourished. No distress.  HENT:  Head: Normocephalic and atraumatic.  Eyes: EOM are normal. Pupils are equal, round, and reactive to light.  Neck: Normal range of motion. Neck supple. No JVD present. No thyromegaly present.  Cardiovascular: Normal rate, regular rhythm and normal heart sounds.   Pulmonary/Chest: Effort normal and breath sounds normal.  Abdominal: Soft. Bowel sounds are normal. She exhibits no distension. There is no tenderness.  Musculoskeletal: Normal range of motion. She exhibits no edema and no tenderness.  Lymphadenopathy:    She has no cervical adenopathy.  Neurological: She is alert and oriented to person, place, and time. She has normal reflexes.  Skin: Skin is warm and dry.  Psychiatric: She has a normal mood and affect. Her behavior is normal. Judgment and thought content normal.    ED Course  Procedures (including critical care time)  Labs Reviewed - No data to display No results found.  No diagnosis found.  MDM  IMPRESSION  Hypertension  Depression  Smoker   Polyuria  High risk medications  Chronic right shoulder pain  RECOMMENDATIONS / PLAN The patient was counseled on the dangers of tobacco use, and was advised to quit and referred to a tobacco cessation program.  Reviewed strategies to maximize success, including removing cigarettes and smoking materials from environment, stress management, substitution of other forms of reinforcement and support of family/friends. Start HCTZ 12.5 mg take 1 po  daily Check labs today to rule out hyperglycemia Check urinalysis to rule out infection Xray right shoulder - refer to orthopedics for further evaluation, continue ibuprofen 800 mg as needed Check BP at home and call our office in 2 weeks with home BP readings  FOLLOW UP 2 months   The patient was given clear instructions to go to ER or return to medical center if symptoms don't improve, worsen or new problems develop.  The patient verbalized understanding.  The patient was told to call to get lab results if they haven't heard anything in the next week.            Cleora Fleet, MD 02/08/12 1225

## 2012-02-08 NOTE — ED Notes (Signed)
Patient states fell a year ago at her sister's house and hurt her right shoulder- never had it looked at, still has pain and can not lift her arm all the way up

## 2012-02-08 NOTE — ED Notes (Signed)
Dr Laural Benes reffered patient to orthopedic pt. Has an appt 02/10/12/ @ 8:45 am [facility]

## 2012-02-13 NOTE — ED Notes (Signed)
Pt was seen @ orthopedic office

## 2012-02-19 ENCOUNTER — Emergency Department (HOSPITAL_COMMUNITY)
Admission: EM | Admit: 2012-02-19 | Discharge: 2012-02-19 | Disposition: A | Discharge status: Home / Self Care | Payer: Medicare Other | Attending: Emergency Medicine | Admitting: Emergency Medicine

## 2012-02-19 ENCOUNTER — Encounter (HOSPITAL_COMMUNITY): Payer: Self-pay | Admitting: Emergency Medicine

## 2012-02-19 ENCOUNTER — Emergency Department (HOSPITAL_COMMUNITY): Payer: Medicare Other

## 2012-02-19 DIAGNOSIS — Z79899 Other long term (current) drug therapy: Secondary | ICD-10-CM | POA: Insufficient documentation

## 2012-02-19 DIAGNOSIS — K319 Disease of stomach and duodenum, unspecified: Secondary | ICD-10-CM | POA: Insufficient documentation

## 2012-02-19 DIAGNOSIS — K921 Melena: Secondary | ICD-10-CM | POA: Insufficient documentation

## 2012-02-19 DIAGNOSIS — F17200 Nicotine dependence, unspecified, uncomplicated: Secondary | ICD-10-CM | POA: Insufficient documentation

## 2012-02-19 DIAGNOSIS — F3289 Other specified depressive episodes: Secondary | ICD-10-CM | POA: Insufficient documentation

## 2012-02-19 DIAGNOSIS — F329 Major depressive disorder, single episode, unspecified: Secondary | ICD-10-CM | POA: Insufficient documentation

## 2012-02-19 DIAGNOSIS — K644 Residual hemorrhoidal skin tags: Secondary | ICD-10-CM | POA: Insufficient documentation

## 2012-02-19 DIAGNOSIS — Z8614 Personal history of Methicillin resistant Staphylococcus aureus infection: Secondary | ICD-10-CM | POA: Insufficient documentation

## 2012-02-19 DIAGNOSIS — K279 Peptic ulcer, site unspecified, unspecified as acute or chronic, without hemorrhage or perforation: Secondary | ICD-10-CM

## 2012-02-19 LAB — CBC WITH DIFFERENTIAL/PLATELET
Basophils Absolute: 0 10*3/uL (ref 0.0–0.1)
Basophils Relative: 0 % (ref 0–1)
Eosinophils Relative: 1 % (ref 0–5)
HCT: 41.5 % (ref 36.0–46.0)
MCHC: 34.2 g/dL (ref 30.0–36.0)
MCV: 87.7 fL (ref 78.0–100.0)
Monocytes Absolute: 0.5 10*3/uL (ref 0.1–1.0)
Monocytes Relative: 7 % (ref 3–12)
RDW: 15 % (ref 11.5–15.5)

## 2012-02-19 LAB — BASIC METABOLIC PANEL
BUN: 11 mg/dL (ref 6–23)
CO2: 25 mEq/L (ref 19–32)
Calcium: 9.7 mg/dL (ref 8.4–10.5)
Creatinine, Ser: 1.19 mg/dL — ABNORMAL HIGH (ref 0.50–1.10)
GFR calc Af Amer: 61 mL/min — ABNORMAL LOW (ref >90)

## 2012-02-19 LAB — HEPATIC FUNCTION PANEL
ALT: 18 U/L (ref 0–35)
AST: 21 U/L (ref 0–37)
Albumin: 3.9 g/dL (ref 3.5–5.2)
Bilirubin, Direct: <0.1 mg/dL (ref 0.0–0.3)

## 2012-02-19 LAB — LIPASE, BLOOD: Lipase: 17 U/L (ref 11–59)

## 2012-02-19 LAB — SAMPLE TO BLOOD BANK

## 2012-02-19 MED ORDER — SODIUM CHLORIDE 0.9 % IV BOLUS (SEPSIS)
1000.0000 mL | Freq: Once | INTRAVENOUS | Status: AC
  Administered 2012-02-19: 1000 mL via INTRAVENOUS

## 2012-02-19 MED ORDER — HYDROMORPHONE HCL PF 1 MG/ML IJ SOLN
1.0000 mg | Freq: Once | INTRAMUSCULAR | Status: AC
  Administered 2012-02-19: 1 mg via INTRAVENOUS
  Filled 2012-02-19: qty 1

## 2012-02-19 MED ORDER — PANTOPRAZOLE SODIUM 40 MG IV SOLR
40.0000 mg | Freq: Once | INTRAVENOUS | Status: AC
  Administered 2012-02-19: 40 mg via INTRAVENOUS
  Filled 2012-02-19: qty 40

## 2012-02-19 MED ORDER — ACETAMINOPHEN-CODEINE #3 300-30 MG PO TABS: 1.0000 | ORAL_TABLET | Freq: Four times a day (QID) | ORAL | Status: DC | PRN

## 2012-02-19 MED ORDER — OMEPRAZOLE 20 MG PO CPDR: 20.0000 mg | DELAYED_RELEASE_CAPSULE | Freq: Every day | ORAL | Status: DC

## 2012-02-19 MED ORDER — SODIUM CHLORIDE 0.9 % IV SOLN
Freq: Once | INTRAVENOUS | Status: AC
  Administered 2012-02-19: 11:00:00 via INTRAVENOUS

## 2012-02-19 NOTE — ED Notes (Signed)
Patient transported to X-ray 

## 2012-02-19 NOTE — ED Notes (Signed)
Rancour to eval ecg.

## 2012-02-19 NOTE — ED Provider Notes (Addendum)
History     CSN: [id number]  Arrival date & time 02/19/12  0821   First MD Initiated Contact with Patient 02/19/12 [phone]      Chief Complaint  Patient presents with  . Abdominal Pain    (Consider location/radiation/quality/duration/timing/severity/associated sxs/prior treatment) HPI Comments: 52 y/o woman with no significant med hx comes in with cc of epigastric abd pain. Pt states that her pain started suddenly about 2 days ago. The pain is in the epigastrium, radiating to the back, and is sharp in nature. She has no n/v/f/c/sob. The pain has worsened, prompting her to come to the ED. Pt has no diarrhea - but states that her last 2 stools were dark and tarry. She has no hx of GI bleed. Pt has no hx of liver dz, alcohol abuse, heavy smoking - she takes ibuprofen intermittently. Pt is noted to be slightly tachycardic, she has no dizziness, sob.   Patient is a 52 y.o. female presenting with abdominal pain. The history is provided by the patient.  Abdominal Pain Associated symptoms: no chest pain, no constipation, no cough, no diarrhea, no hematuria, no nausea, no shortness of breath and no vomiting     Past Medical History  Diagnosis Date  . Depression   . MRSA (methicillin resistant Staphylococcus aureus)     Past Surgical History  Procedure Laterality Date  . Abdominal hysterectomy      History reviewed. No pertinent family history.  History  Substance Use Topics  . Smoking status: Current Every Day Smoker  . Smokeless tobacco: Not on file  . Alcohol Use: No    OB History   Grav Para Term Preterm Abortions TAB SAB Ect Mult Living                  Review of Systems  Constitutional: Negative for activity change.  HENT: Negative for facial swelling and neck pain.   Respiratory: Negative for cough, shortness of breath and wheezing.   Cardiovascular: Negative for chest pain.  Gastrointestinal: Positive for abdominal pain and blood in stool. Negative for nausea,  vomiting, diarrhea, constipation and abdominal distention.  Genitourinary: Negative for hematuria and difficulty urinating.  Skin: Negative for color change.  Neurological: Negative for speech difficulty.  Hematological: Does not bruise/bleed easily.  Psychiatric/Behavioral: Negative for confusion.    Allergies  Doxycycline  Home Medications   Current Outpatient Rx  Name  Route  Sig  Dispense  Refill  . hydrochlorothiazide (MICROZIDE) 12.5 MG capsule   Oral   Take 12.5 mg by mouth daily.         Marland Kitchen ibuprofen (ADVIL,MOTRIN) 800 MG tablet   Oral   Take 800 mg by mouth every 8 (eight) hours as needed for pain. For pain         . QUEtiapine (SEROQUEL) 400 MG tablet   Oral   Take 400 mg by mouth at bedtime.           BP 129/89  Pulse 94  Temp(Src) 98.2 F (36.8 C) (Oral)  Resp 16  SpO2 100%  Physical Exam  Nursing note and vitals reviewed. Constitutional: She is oriented to person, place, and time. She appears well-developed.  HENT:  Head: Normocephalic and atraumatic.  Eyes: Conjunctivae and EOM are normal. Pupils are equal, round, and reactive to light.  Neck: Normal range of motion. Neck supple.  Cardiovascular: Normal rate, regular rhythm and normal heart sounds.   Pulmonary/Chest: Effort normal and breath sounds normal. No respiratory distress.  Abdominal:  Soft. Bowel sounds are normal. She exhibits no distension. There is tenderness. There is no rebound and no guarding.  Epigastric tenderness, DRE reveals - Guaiac positive stools. She had dark stools. External hemorrhoids.  Neurological: She is alert and oriented to person, place, and time.  Skin: Skin is warm and dry.    ED Course  Procedures (including critical care time)  Labs Reviewed  BASIC METABOLIC PANEL - Abnormal; Notable for the following:    Creatinine, Ser 1.19 (*)    GFR calc non Af Amer 53 (*)    GFR calc Af Amer 61 (*)    All other components within normal limits  OCCULT BLOOD, POC  DEVICE - Abnormal; Notable for the following:    Fecal Occult Bld POSITIVE (*)    All other components within normal limits  CBC WITH DIFFERENTIAL  LIPASE, BLOOD  HEPATIC FUNCTION PANEL  OCCULT BLOOD X 1 CARD TO LAB, STOOL  POCT I-STAT TROPONIN I  SAMPLE TO BLOOD BANK   Dg Chest 2 View  02/19/2012  *RADIOLOGY REPORT*  Clinical Data: Chest pain, shortness of breath, smoking history  CHEST - 2 VIEW  Comparison: Chest x-ray of 07/02/2010  Findings: No active infiltrate or effusion is seen.  Mediastinal contours are stable.  The heart is within normal limits in size. There is a mild thoracolumbar scoliosis present which appears stable.  IMPRESSION: Stable chest x-ray.  No active lung disease.  No change in mild thoracolumbar scoliosis.   Original Report Authenticated By: Dwyane Dee, M.D.      No diagnosis found.    MDM  DDx includes: Pancreatitis Hepatobiliary pathology including cholecystitis Gastritis/PUD SBO ACS syndrome Aortic Dissection  Pt comes in with cc of epigastric abd pain, no risk factors for pancreatitis. She has melenotic stools x 2. Guaiac positive. Pt has no risk factors for gastritis either!  Concerns for hepatobiliary process is low. Concerns for dissection is low. Will get CXR and bilateral BP upper extremities.    Derwood Kaplan, MD 02/19/12 1108  Pt will be discharged - Dr. Madilyn Fireman, GI,  will follow up  Derwood Kaplan, MD 02/19/12 1337

## 2012-02-19 NOTE — ED Notes (Signed)
Pt c/o epigastric pain and black stools x 2 days; pt sts some nausea

## 2012-04-06 ENCOUNTER — Encounter (HOSPITAL_COMMUNITY): Payer: Self-pay

## 2012-04-06 ENCOUNTER — Emergency Department (INDEPENDENT_AMBULATORY_CARE_PROVIDER_SITE_OTHER)
Admission: EM | Admit: 2012-04-06 | Discharge: 2012-04-06 | Disposition: A | Discharge status: Home / Self Care | Payer: Medicare Other | Source: Home / Self Care | Attending: Family Medicine | Admitting: Family Medicine

## 2012-04-06 DIAGNOSIS — R7303 Prediabetes: Secondary | ICD-10-CM

## 2012-04-06 DIAGNOSIS — E785 Hyperlipidemia, unspecified: Secondary | ICD-10-CM

## 2012-04-06 DIAGNOSIS — R7309 Other abnormal glucose: Secondary | ICD-10-CM

## 2012-04-06 DIAGNOSIS — K279 Peptic ulcer, site unspecified, unspecified as acute or chronic, without hemorrhage or perforation: Secondary | ICD-10-CM

## 2012-04-06 DIAGNOSIS — R109 Unspecified abdominal pain: Secondary | ICD-10-CM

## 2012-04-06 DIAGNOSIS — I10 Essential (primary) hypertension: Secondary | ICD-10-CM | POA: Insufficient documentation

## 2012-04-06 NOTE — ED Provider Notes (Signed)
History     CSN: [id number]  Arrival date & time 04/06/12  1103   First MD Initiated Contact with Patient 04/06/12 1143      Chief Complaint  Patient presents with  . Follow-up    (Consider location/radiation/quality/duration/timing/severity/associated sxs/prior treatment) HPI Pt here to follow up from hospital. Pt diagnosed with peptic ulcer.  Pt started on prilosec.  PT is tolerating the medication well.  She is no longer taking NSAIDS.  No chest pain or SOB.  No severe fatigue.  Past Medical History  Diagnosis Date  . Depression   . MRSA (methicillin resistant Staphylococcus aureus)     Past Surgical History  Procedure Laterality Date  . Abdominal hysterectomy      No family history on file.  History  Substance Use Topics  . Smoking status: Current Every Day Smoker  . Smokeless tobacco: Not on file  . Alcohol Use: No    OB History   Grav Para Term Preterm Abortions TAB SAB Ect Mult Living                 Review of Systems  Constitutional: Negative for fever, fatigue and unexpected weight change.  Respiratory: Negative for shortness of breath.   Gastrointestinal: Negative for abdominal pain, constipation, blood in stool, abdominal distention and anal bleeding.  All other systems reviewed and are negative.    Allergies  Doxycycline  Home Medications   Current Outpatient Rx  Name  Route  Sig  Dispense  Refill  . acetaminophen-codeine (TYLENOL #3) 300-30 MG per tablet   Oral   Take 1-2 tablets by mouth every 6 (six) hours as needed for pain.   15 tablet   0   . hydrochlorothiazide (MICROZIDE) 12.5 MG capsule   Oral   Take 12.5 mg by mouth daily.         Marland Kitchen ibuprofen (ADVIL,MOTRIN) 800 MG tablet   Oral   Take 800 mg by mouth every 8 (eight) hours as needed for pain. For pain         . omeprazole (PRILOSEC) 20 MG capsule   Oral   Take 1 capsule (20 mg total) by mouth daily.   15 capsule   0   . QUEtiapine (SEROQUEL) 400 MG tablet   Oral   Take 400 mg by mouth at bedtime.           BP 134/81  Pulse 97  Temp(Src) 97.9 F (36.6 C) (Oral)  SpO2 100%  Physical Exam  Nursing note and vitals reviewed. Constitutional: She is oriented to person, place, and time. She appears well-developed and well-nourished. No distress.  HENT:  Head: Normocephalic and atraumatic.  Eyes: Conjunctivae and EOM are normal. Pupils are equal, round, and reactive to light.  Neck: Normal range of motion. Neck supple. No JVD present. No tracheal deviation present. No thyromegaly present.  Cardiovascular: Normal rate, regular rhythm and normal heart sounds.   Pulmonary/Chest: Effort normal and breath sounds normal. No respiratory distress.  Abdominal: Soft. Bowel sounds are normal.  Musculoskeletal: Normal range of motion.  Lymphadenopathy:    She has no cervical adenopathy.  Neurological: She is alert and oriented to person, place, and time.  Skin: Skin is warm and dry. No rash noted. No erythema. No pallor.  Psychiatric: She has a normal mood and affect. Her behavior is normal. Judgment and thought content normal.    ED Course  Procedures (including critical care time)  Labs Reviewed - No data to display No results  found.  No diagnosis found.   MDM  IMPRESSION  Hypertension   Peptic ulcer   GERD  Right shoulder pain  RECOMMENDATIONS / PLAN Continue omeprazole Continue HCTZ 12.5 mg po daily for blood pressure Follow up with GI Pt not having to use ibuprofen any longer and advised to avoid all NSAIDS   FOLLOW UP 3 months   The patient was given clear instructions to go to ER or return to medical center if symptoms don't improve, worsen or new problems develop.  The patient verbalized understanding.  The patient was told to call to get lab results if they haven't heard anything in the next week.           Cleora Fleet, MD 04/07/12 [phone]

## 2012-04-06 NOTE — ED Notes (Signed)
Follow up- history of HTN

## 2012-06-11 ENCOUNTER — Ambulatory Visit: Payer: Medicare Other | Attending: Family Medicine | Admitting: Internal Medicine

## 2012-06-11 ENCOUNTER — Encounter: Payer: Self-pay | Admitting: Internal Medicine

## 2012-06-11 VITALS — BP 135/91 | HR 95 | Temp 98.8°F | Resp 18 | Ht 64.0 in | Wt 165.6 lb

## 2012-06-11 DIAGNOSIS — Z79899 Other long term (current) drug therapy: Secondary | ICD-10-CM | POA: Insufficient documentation

## 2012-06-11 DIAGNOSIS — K279 Peptic ulcer, site unspecified, unspecified as acute or chronic, without hemorrhage or perforation: Secondary | ICD-10-CM

## 2012-06-11 DIAGNOSIS — I10 Essential (primary) hypertension: Secondary | ICD-10-CM

## 2012-06-11 DIAGNOSIS — K219 Gastro-esophageal reflux disease without esophagitis: Secondary | ICD-10-CM | POA: Insufficient documentation

## 2012-06-11 DIAGNOSIS — F329 Major depressive disorder, single episode, unspecified: Secondary | ICD-10-CM

## 2012-06-11 DIAGNOSIS — F3289 Other specified depressive episodes: Secondary | ICD-10-CM | POA: Insufficient documentation

## 2012-06-11 MED ORDER — OMEPRAZOLE 20 MG PO CPDR: 20.0000 mg | DELAYED_RELEASE_CAPSULE | Freq: Every day | ORAL | Status: DC

## 2012-06-11 NOTE — Progress Notes (Signed)
Patient complaint that MEDICATIONS  Are too strong. Blood Pressure medications  Make her feel dizzy.

## 2012-06-11 NOTE — Progress Notes (Signed)
Patient ID: Crystal Stafford, female   DOB: [birth year], 52 y.o.   MRN: [id number] Patient Demographics  Crystal Stafford, is a 52 y.o. female  NWG:[id number]  [id number]:[id number]  DOB - January 25, 1960  Chief Complaint  Patient presents with  . Blood Pressure Check        Subjective:   Crystal Stafford today is here for a follow up visit.She still has GERD like symptoms, unfortunately she is not complaint with Prilosec. She claims she stopped taking her Anti-HTNsive meds as it made her feel dizzy. She last took HCTZ approximately a month back. She would like to stay of all pills.She has no other complaints. Denies right shoulder pain this visit.  Patient has No headache, No chest pain, No abdominal pain - No Nausea, No new weakness tingling or numbness, No Cough - SOB.  Objective:    Filed Vitals:   06/11/12 0935  BP: 135/91  Pulse: 95  Temp: 98.8 F (37.1 C)  Resp: 18  Height: 5\' 4"  (1.626 m)  Weight: 165 lb 9.6 oz (75.116 kg)  SpO2: 99%     ALLERGIES:   Allergies  Allergen Reactions  . Doxycycline     REACTION: swelling and rash    PAST MEDICAL HISTORY: Past Medical History  Diagnosis Date  . Depression   . MRSA (methicillin resistant Staphylococcus aureus)     MEDICATIONS AT HOME: Prior to Admission medications   Medication Sig Start Date End Date Taking? Authorizing Provider  acetaminophen-codeine (TYLENOL #3) 300-30 MG per tablet Take 1-2 tablets by mouth every 6 (six) hours as needed for pain. 02/19/12   Derwood Kaplan, MD  hydrochlorothiazide (MICROZIDE) 12.5 MG capsule Take 12.5 mg by mouth daily.    Historical Provider, MD  ibuprofen (ADVIL,MOTRIN) 800 MG tablet Take 800 mg by mouth every 8 (eight) hours as needed for pain. For pain    Historical Provider, MD  omeprazole (PRILOSEC) 20 MG capsule Take 1 capsule (20 mg total) by mouth daily. 02/19/12   Derwood Kaplan, MD  QUEtiapine (SEROQUEL) 400 MG tablet Take 400 mg by mouth at bedtime.    Historical Provider, MD      Exam  General appearance :Awake, alert, not in any distress. Speech Clear. Not toxic Looking HEENT: Atraumatic and Normocephalic, pupils equally reactive to light and accomodation Neck: supple, no JVD. No cervical lymphadenopathy.  Chest:Good air entry bilaterally, no added sounds  CVS: S1 S2 regular, no murmurs.  Abdomen: Bowel sounds present, Non tender and not distended with no gaurding, rigidity or rebound. Extremities: B/L Lower Ext shows no edema, both legs are warm to touch Neurology: Awake alert, and oriented X 3, CN II-XII intact, Non focal Skin:No Rash Wounds:N/A    Data Review   CBC No results found for this basename: WBC, HGB, HCT, PLT, MCV, MCH, MCHC, RDW, NEUTRABS, LYMPHSABS, MONOABS, EOSABS, BASOSABS, BANDABS, BANDSABD,  in the last 168 hours  Chemistries   No results found for this basename: NA, K, CL, CO2, GLUCOSE, BUN, CREATININE, GFRCGP, CALCIUM, MG, AST, ALT, ALKPHOS, BILITOT,  in the last 168 hours ------------------------------------------------------------------------------------------------------------------ No results found for this basename: HGBA1C,  in the last 72 hours ------------------------------------------------------------------------------------------------------------------ No results found for this basename: CHOL, HDL, LDLCALC, TRIG, CHOLHDL, LDLDIRECT,  in the last 72 hours ------------------------------------------------------------------------------------------------------------------ No results found for this basename: TSH, T4TOTAL, FREET3, T3FREE, THYROIDAB,  in the last 72 hours ------------------------------------------------------------------------------------------------------------------ No results found for this basename: VITAMINB12, FOLATE, FERRITIN, TIBC, IRON, RETICCTPCT,  in the last 72 hours  Coagulation profile  No results found for this basename: INR, PROTIME,  in the last 168 hours    Assessment & Plan    GERD -resume PPI-emphasized importance of compliance. She claims she will take it for a month and see if this helps.  HTN -not taking HCTZ for almost a month-BP today controlled off meds-she would like to try just diet and exercise for now -will not start any meds for now, and bring her back in 1 month to see how her BP is  Depression -c/w Seroquel  Follow up in 1 month

## 2012-07-11 ENCOUNTER — Ambulatory Visit: Payer: Medicare Other | Attending: Family Medicine | Admitting: Internal Medicine

## 2012-07-11 VITALS — BP 136/87 | HR 93 | Temp 98.4°F | Resp 16

## 2012-07-11 DIAGNOSIS — F329 Major depressive disorder, single episode, unspecified: Secondary | ICD-10-CM | POA: Insufficient documentation

## 2012-07-11 DIAGNOSIS — F3289 Other specified depressive episodes: Secondary | ICD-10-CM | POA: Insufficient documentation

## 2012-07-11 DIAGNOSIS — R7309 Other abnormal glucose: Secondary | ICD-10-CM | POA: Insufficient documentation

## 2012-07-11 DIAGNOSIS — E119 Type 2 diabetes mellitus without complications: Secondary | ICD-10-CM

## 2012-07-11 DIAGNOSIS — I10 Essential (primary) hypertension: Secondary | ICD-10-CM | POA: Insufficient documentation

## 2012-07-11 MED ORDER — QUETIAPINE FUMARATE 25 MG PO TABS: 400.0000 mg | ORAL_TABLET | Freq: Every day | ORAL | Status: DC

## 2012-07-11 NOTE — Progress Notes (Signed)
Pt here for f/u concerning BP medication. Pt is currently not taking med due to side effects insomnia. Pt also was told last visit she was pre diabetic and prescribed Metformin. She will not take med until diagnosis confirmed.vss

## 2012-07-11 NOTE — Patient Instructions (Addendum)
Hypertension call us back if the blood pressure is persistently higher > 145/90  As your heart beats, it forces blood through your arteries. This force is your blood pressure. If the pressure is too high, it is called hypertension (HTN) or high blood pressure. HTN is dangerous because you may have it and not know it. High blood pressure may mean that your heart has to work harder to pump blood. Your arteries may be narrow or stiff. The extra work puts you at risk for heart disease, stroke, and other problems.  Blood pressure consists of two numbers, a higher number over a lower, 110/72, for example. It is stated as "110 over 72." The ideal is below 120 for the top number (systolic) and under 80 for the bottom (diastolic). Write down your blood pressure today. You should pay close attention to your blood pressure if you have certain conditions such as:  Heart failure.  Prior heart attack.  Diabetes  Chronic kidney disease.  Prior stroke.  Multiple risk factors for heart disease. To see if you have HTN, your blood pressure should be measured while you are seated with your arm held at the level of the heart. It should be measured at least twice. A one-time elevated blood pressure reading (especially in the Emergency Department) does not mean that you need treatment. There may be conditions in which the blood pressure is different between your right and left arms. It is important to see your caregiver soon for a recheck. Most people have essential hypertension which means that there is not a specific cause. This type of high blood pressure may be lowered by changing lifestyle factors such as:  Stress.  Smoking.  Lack of exercise.  Excessive weight.  Drug/tobacco/alcohol use.  Eating less salt. Most people do not have symptoms from high blood pressure until it has caused damage to the body. Effective treatment can often prevent, delay or reduce that damage. TREATMENT  When a cause has been  identified, treatment for high blood pressure is directed at the cause. There are a large number of medications to treat HTN. These fall into several categories, and your caregiver will help you select the medicines that are best for you. Medications may have side effects. You should review side effects with your caregiver. If your blood pressure stays high after you have made lifestyle changes or started on medicines,   Your medication(s) may need to be changed.  Other problems may need to be addressed.  Be certain you understand your prescriptions, and know how and when to take your medicine.  Be sure to follow up with your caregiver within the time frame advised (usually within two weeks) to have your blood pressure rechecked and to review your medications.  If you are taking more than one medicine to lower your blood pressure, make sure you know how and at what times they should be taken. Taking two medicines at the same time can result in blood pressure that is too low. SEEK IMMEDIATE MEDICAL CARE IF:  You develop a severe headache, blurred or changing vision, or confusion.  You have unusual weakness or numbness, or a faint feeling.  You have severe chest or abdominal pain, vomiting, or breathing problems. MAKE SURE YOU:   Understand these instructions.  Will watch your condition.  Will get help right away if you are not doing well or get worse. Document Released: 12/27/2004 Document Revised: 03/21/2011 Document Reviewed: 08/17/2007 [facility] Patient Information 2014 [place], [place].

## 2012-07-11 NOTE — Progress Notes (Signed)
Patient ID: Crystal Stafford, female   DOB: [birth year], 52 y.o.   MRN: [id number]   CC: Followup  HPI: Patient is 52 year old female who presents to clinic for followup and would like to discuss current medication she's taking Seroquel 400 mg tablet daily. She explains the medication makes her dizzy and very tired and she does not like to take it. Last time she took it was 2 weeks prior to this visit. In addition she is not sure if she is diabetic and wants to discuss if she needs to take metformin. She explains she is checking blood pressure regularly and it usually stable less than 140/90, she denies chest pain or shortness of breath, no specific abdominal or urinary concerns. Patient denies recent sicknesses or hospitalizations.  Allergies  Allergen Reactions  . Doxycycline     REACTION: swelling and rash   Past Medical History  Diagnosis Date  . Depression   . MRSA (methicillin resistant Staphylococcus aureus)    Current Outpatient Prescriptions on File Prior to Visit  Medication Sig Dispense Refill  . ibuprofen (ADVIL,MOTRIN) 800 MG tablet Take 800 mg by mouth every 8 (eight) hours as needed for pain. For pain      . omeprazole (PRILOSEC) 20 MG capsule Take 1 capsule (20 mg total) by mouth daily.  30 capsule  1   No current facility-administered medications on file prior to visit.   No known family medical history History   Social History  . Marital Status: Married    Spouse Name: N/A    Number of Children: N/A  . Years of Education: N/A   Occupational History  . Not on file.   Social History Main Topics  . Smoking status: Current Every Day Smoker  . Smokeless tobacco: Not on file  . Alcohol Use: No  . Drug Use: No  . Sexually Active:    Other Topics Concern  . Not on file   Social History Narrative  . No narrative on file    Review of Systems  Constitutional: Negative for fever, chills, diaphoresis, activity change, appetite change and fatigue.  HENT: Negative  for ear pain, nosebleeds, congestion, facial swelling, rhinorrhea, neck pain, neck stiffness and ear discharge.   Eyes: Negative for pain, discharge, redness, itching and visual disturbance.  Respiratory: Negative for cough, choking, chest tightness, shortness of breath, wheezing and stridor.   Cardiovascular: Negative for chest pain, palpitations and leg swelling.  Gastrointestinal: Negative for abdominal distention.  Genitourinary: Negative for dysuria, urgency, frequency, hematuria, flank pain, decreased urine volume, difficulty urinating and dyspareunia.  Musculoskeletal: Negative for back pain, joint swelling, arthralgias and gait problem.  Neurological: Negative for dizziness, tremors, seizures, syncope, facial asymmetry, speech difficulty, weakness, light-headedness, numbness and headaches.  Hematological: Negative for adenopathy. Does not bruise/bleed easily.  Psychiatric/Behavioral: Negative for hallucinations, behavioral problems, confusion, dysphoric mood, decreased concentration and agitation.    Objective:   Filed Vitals:   07/11/12 0939  BP: 136/87  Pulse: 93  Temp: 98.4 F (36.9 C)  Resp: 16    Physical Exam  Constitutional: Appears well-developed and well-nourished. No distress.  CVS: RRR, S1/S2 +, no murmurs, no gallops, no carotid bruit.  Pulmonary: Effort and breath sounds normal, no stridor, rhonchi, wheezes, rales.  Abdominal: Soft. BS +,  no distension, tenderness, rebound or guarding.  Musculoskeletal: Normal range of motion. No edema and no tenderness.  Psychiatric: Normal mood and affect. Behavior, judgment, thought content normal.   Lab Results  Component Value Date  WBC 8.2 02/19/2012   HGB 14.2 02/19/2012   HCT 41.5 02/19/2012   MCV 87.7 02/19/2012   PLT 285 02/19/2012   Lab Results  Component Value Date   CREATININE 1.19* 02/19/2012   BUN 11 02/19/2012   NA 138 02/19/2012   K 4.3 02/19/2012   CL 103 02/19/2012   CO2 25 02/19/2012    Lab Results  Component  Value Date   HGBA1C 6.1* 02/08/2012   Lipid Panel     Component Value Date/Time   CHOL 201* 02/08/2012 1122   TRIG 335* 05/19/2009 0000   HDL 36* 05/19/2009 0000   CHOLHDL 6.6 Ratio 05/19/2009 0000   VLDL 67* 05/19/2009 0000   LDLCALC 133* 05/19/2009 0000       Assessment and plan:   Patient Active Problem List   Diagnosis Date Noted  . Hypertension - reasonable control, patient has not been taking HCTZ in some time. We have discussed importance of checking blood pressure regularly and to call us back if the numbers are higher than 140/90. Patient has agreed to exercise and watch diet closely, we have discussed that her blood pressure medication can be temporarily stopped for now but if the numbers are higher than 140/90 we'll have to restart it. Patient has agreed  04/06/2012  . Prediabetes - last A1c in January 2000 410 6.1, we'll repeat A1c today. I advised patient to exercise and watch diet closely. I have provided dietary recommendations and patient appreciated concern and recommendation. I have discussed with patient if A1c is higher we may need to start metformin but patient is questioning if she can try dietary and exercise strategies first. I agreed with her that we'll followup on A1c first. 04/06/2012  . DEPRESSION - Seroquel 400 mg dose appears to be too high for patient and makes her too dizzy and sleepy. I will start Seroquel 25 mg tablet daily and I advised patient to call me back to see that medicine is working for her.  05/19/2009

## 2012-10-25 ENCOUNTER — Emergency Department (HOSPITAL_COMMUNITY): Payer: Medicare Other

## 2012-10-25 ENCOUNTER — Emergency Department (HOSPITAL_COMMUNITY)
Admission: EM | Admit: 2012-10-25 | Discharge: 2012-10-25 | Disposition: A | Discharge status: Home / Self Care | Payer: Medicare Other | Attending: Emergency Medicine | Admitting: Emergency Medicine

## 2012-10-25 ENCOUNTER — Encounter: Payer: Self-pay | Admitting: Internal Medicine

## 2012-10-25 ENCOUNTER — Ambulatory Visit: Payer: Medicare Other | Attending: Internal Medicine | Admitting: Internal Medicine

## 2012-10-25 ENCOUNTER — Encounter (HOSPITAL_COMMUNITY): Payer: Self-pay | Admitting: Emergency Medicine

## 2012-10-25 VITALS — BP 117/80 | HR 90 | Temp 98.3°F | Resp 16 | Ht 64.0 in | Wt 170.0 lb

## 2012-10-25 DIAGNOSIS — R000 Tachycardia, unspecified: Secondary | ICD-10-CM | POA: Insufficient documentation

## 2012-10-25 DIAGNOSIS — F329 Major depressive disorder, single episode, unspecified: Secondary | ICD-10-CM

## 2012-10-25 DIAGNOSIS — X58XXXA Exposure to other specified factors, initial encounter: Secondary | ICD-10-CM | POA: Diagnosis not present

## 2012-10-25 DIAGNOSIS — L299 Pruritus, unspecified: Secondary | ICD-10-CM | POA: Insufficient documentation

## 2012-10-25 DIAGNOSIS — T7840XA Allergy, unspecified, initial encounter: Secondary | ICD-10-CM | POA: Insufficient documentation

## 2012-10-25 DIAGNOSIS — R220 Localized swelling, mass and lump, head: Secondary | ICD-10-CM | POA: Insufficient documentation

## 2012-10-25 DIAGNOSIS — F411 Generalized anxiety disorder: Secondary | ICD-10-CM | POA: Insufficient documentation

## 2012-10-25 DIAGNOSIS — Y929 Unspecified place or not applicable: Secondary | ICD-10-CM | POA: Insufficient documentation

## 2012-10-25 DIAGNOSIS — F17200 Nicotine dependence, unspecified, uncomplicated: Secondary | ICD-10-CM | POA: Insufficient documentation

## 2012-10-25 DIAGNOSIS — Z881 Allergy status to other antibiotic agents status: Secondary | ICD-10-CM | POA: Insufficient documentation

## 2012-10-25 DIAGNOSIS — Y939 Activity, unspecified: Secondary | ICD-10-CM | POA: Insufficient documentation

## 2012-10-25 DIAGNOSIS — I10 Essential (primary) hypertension: Secondary | ICD-10-CM | POA: Insufficient documentation

## 2012-10-25 DIAGNOSIS — T6591XA Toxic effect of unspecified substance, accidental (unintentional), initial encounter: Secondary | ICD-10-CM | POA: Insufficient documentation

## 2012-10-25 DIAGNOSIS — Z8614 Personal history of Methicillin resistant Staphylococcus aureus infection: Secondary | ICD-10-CM | POA: Insufficient documentation

## 2012-10-25 DIAGNOSIS — L509 Urticaria, unspecified: Secondary | ICD-10-CM | POA: Insufficient documentation

## 2012-10-25 DIAGNOSIS — R21 Rash and other nonspecific skin eruption: Secondary | ICD-10-CM | POA: Insufficient documentation

## 2012-10-25 DIAGNOSIS — Z79899 Other long term (current) drug therapy: Secondary | ICD-10-CM | POA: Insufficient documentation

## 2012-10-25 DIAGNOSIS — R079 Chest pain, unspecified: Secondary | ICD-10-CM | POA: Insufficient documentation

## 2012-10-25 DIAGNOSIS — F3289 Other specified depressive episodes: Secondary | ICD-10-CM | POA: Insufficient documentation

## 2012-10-25 DIAGNOSIS — Z0000 Encounter for general adult medical examination without abnormal findings: Secondary | ICD-10-CM

## 2012-10-25 LAB — POCT I-STAT, CHEM 8
Calcium, Ion: 1.28 mmol/L — ABNORMAL HIGH (ref 1.12–1.23)
Creatinine, Ser: 1.3 mg/dL — ABNORMAL HIGH (ref 0.50–1.10)
Glucose, Bld: 148 mg/dL — ABNORMAL HIGH (ref 70–99)
HCT: 44 % (ref 36.0–46.0)
Hemoglobin: 15 g/dL (ref 12.0–15.0)
TCO2: 21 mmol/L (ref 0–100)

## 2012-10-25 LAB — CBC WITH DIFFERENTIAL/PLATELET
Basophils Relative: 0 % (ref 0–1)
Eosinophils Absolute: 0 10*3/uL (ref 0.0–0.7)
Eosinophils Relative: 0 % (ref 0–5)
Hemoglobin: 13.5 g/dL (ref 12.0–15.0)
Lymphs Abs: 3.2 10*3/uL (ref 0.7–4.0)
MCH: 30.2 pg (ref 26.0–34.0)
MCHC: 34.9 g/dL (ref 30.0–36.0)
MCV: 86.6 fL (ref 78.0–100.0)
Monocytes Relative: 4 % (ref 3–12)
RBC: 4.47 MIL/uL (ref 3.87–5.11)

## 2012-10-25 MED ORDER — METHYLPREDNISOLONE SODIUM SUCC 125 MG IJ SOLR
125.0000 mg | Freq: Once | INTRAMUSCULAR | Status: AC
  Administered 2012-10-25: 125 mg via INTRAVENOUS
  Filled 2012-10-25: qty 2

## 2012-10-25 MED ORDER — FAMOTIDINE 20 MG PO TABS: 20.0000 mg | ORAL_TABLET | Freq: Two times a day (BID) | ORAL | Status: DC

## 2012-10-25 MED ORDER — EPINEPHRINE 0.3 MG/0.3ML IJ SOAJ: 0.3000 mg | Freq: Once | INTRAMUSCULAR | Status: DC | PRN

## 2012-10-25 MED ORDER — PREDNISONE 20 MG PO TABS: 40.0000 mg | ORAL_TABLET | Freq: Every day | ORAL | Status: DC

## 2012-10-25 MED ORDER — HYDROXYZINE HCL 25 MG PO TABS: 25.0000 mg | ORAL_TABLET | Freq: Four times a day (QID) | ORAL | Status: DC

## 2012-10-25 MED ORDER — DIPHENHYDRAMINE HCL 50 MG/ML IJ SOLN
25.0000 mg | Freq: Once | INTRAMUSCULAR | Status: AC
  Administered 2012-10-25: 25 mg via INTRAVENOUS
  Filled 2012-10-25: qty 1

## 2012-10-25 MED ORDER — PREDNISONE 10 MG PO TABS: 10.0000 mg | ORAL_TABLET | Freq: Every day | ORAL | Status: DC

## 2012-10-25 MED ORDER — FAMOTIDINE IN NACL 20-0.9 MG/50ML-% IV SOLN
20.0000 mg | Freq: Once | INTRAVENOUS | Status: AC
  Administered 2012-10-25: 20 mg via INTRAVENOUS
  Filled 2012-10-25: qty 50

## 2012-10-25 NOTE — ED Provider Notes (Signed)
CSN: [id number]     Arrival date & time 10/25/12  1700 History   First MD Initiated Contact with Patient 10/25/12 1722     Chief Complaint  Patient presents with  . Allergic Reaction   (Consider location/radiation/quality/duration/timing/severity/associated sxs/prior Treatment) HPI Comments: 52 year old female with a history of hypertension (untreated) and no other significant history - she developed rash on her hands 2 days ago - she then became more and more itchy and the rash spread up the arms and onto the chest.  Today the face also became swollen and had a rash. She was taken to the hospital by her daughter because of the increased rash on the face and the swelling. She has been taking Benadryl for the last 2 days for the itching but it is not helping. The symptoms are becoming severe and while she was waiting in the waiting room she developed chest pain.  Patient is a 52 y.o. female presenting with urticaria. The history is provided by the patient and a relative.  Urticaria    Past Medical History  Diagnosis Date  . Depression   . MRSA (methicillin resistant Staphylococcus aureus)    Past Surgical History  Procedure Laterality Date  . Abdominal hysterectomy     No family history on file. History  Substance Use Topics  . Smoking status: Current Every Day Smoker -- 1.00 packs/day    Types: Cigarettes  . Smokeless tobacco: Not on file  . Alcohol Use: No   OB History   Grav Para Term Preterm Abortions TAB SAB Ect Mult Living                 Review of Systems  All other systems reviewed and are negative.    Allergies  Doxycycline  Home Medications   Current Outpatient Rx  Name  Route  Sig  Dispense  Refill  . diphenhydrAMINE (BENADRYL) 25 MG tablet   Oral   Take 50 mg by mouth every 6 (six) hours as needed for itching.         Marland Kitchen ibuprofen (ADVIL,MOTRIN) 800 MG tablet   Oral   Take 800 mg by mouth every 8 (eight) hours as needed for pain. For pain          . omeprazole (PRILOSEC) 20 MG capsule   Oral   Take 1 capsule (20 mg total) by mouth daily.   30 capsule   1   . sertraline (ZOLOFT) 50 MG tablet   Oral   Take 50 mg by mouth 2 (two) times daily.         . traZODone (DESYREL) 50 MG tablet   Oral   Take 50 mg by mouth at bedtime.         Marland Kitchen EPINEPHrine (EPIPEN 2-PAK) 0.3 mg/0.3 mL SOAJ injection   Intramuscular   Inject 0.3 mLs (0.3 mg total) into the muscle once as needed (for severe allergic reaction). CAll 911 immediately if you have to use this medicine   1 Device   1   . famotidine (PEPCID) 20 MG tablet   Oral   Take 1 tablet (20 mg total) by mouth 2 (two) times daily.   30 tablet   0   . hydrOXYzine (ATARAX/VISTARIL) 25 MG tablet   Oral   Take 1 tablet (25 mg total) by mouth every 6 (six) hours.   12 tablet   0   . predniSONE (DELTASONE) 10 MG tablet   Oral   Take 1 tablet (10 mg  total) by mouth daily.   3 tablet   0   . predniSONE (DELTASONE) 20 MG tablet   Oral   Take 2 tablets (40 mg total) by mouth daily.   10 tablet   0    BP 146/93  Pulse 88  Temp(Src) 97.7 F (36.5 C) (Oral)  Resp 15  Wt 169 lb 9.6 oz (76.93 kg)  BMI 29.1 kg/m2  SpO2 99% Physical Exam  Nursing note and vitals reviewed. Constitutional: She appears well-developed and well-nourished. She appears distressed.  HENT:  Head: Normocephalic and atraumatic.  Mouth/Throat: Oropharynx is clear and moist. No oropharyngeal exudate.  No intraoral lesions, mucous membranes are moist  Eyes: Conjunctivae and EOM are normal. Pupils are equal, round, and reactive to light. Right eye exhibits no discharge. Left eye exhibits no discharge. No scleral icterus.  Neck: Normal range of motion. Neck supple. No JVD present. No thyromegaly present.  Cardiovascular: Regular rhythm, normal heart sounds and intact distal pulses.  Exam reveals no gallop and no friction rub.   No murmur heard. Tachycardic to 120, strong pulses at the radial arteries   Pulmonary/Chest: Effort normal and breath sounds normal. No respiratory distress. She has no wheezes. She has no rales.  Abdominal: Soft. Bowel sounds are normal. She exhibits no distension and no mass. There is no tenderness.  Musculoskeletal: Normal range of motion. She exhibits no edema and no tenderness.  Lymphadenopathy:    She has no cervical adenopathy.  Neurological: She is alert. Coordination normal.  Skin: Skin is warm and dry. Rash noted. No erythema.  Diffuse mild urticarial rash to the upper trapezius and the face, no lesion seen on the chest or the back  Psychiatric: She has a normal mood and affect. Her behavior is normal.    ED Course  Procedures (including critical care time) Labs Review Labs Reviewed  POCT I-STAT, CHEM 8 - Abnormal; Notable for the following:    Potassium 3.4 (*)    Creatinine, Ser 1.30 (*)    Glucose, Bld 148 (*)    Calcium, Ion 1.28 (*)    All other components within normal limits  CBC WITH DIFFERENTIAL  POCT I-STAT TROPONIN I   Imaging Review Dg Chest Port 1 View  10/25/2012   CLINICAL DATA:  Chest pain.  EXAM: PORTABLE CHEST - 1 VIEW  COMPARISON:  02/19/2012  FINDINGS: The cardiomediastinal silhouette is unremarkable.  The lungs are clear.  There is no evidence of focal airspace disease, pulmonary edema, suspicious pulmonary nodule/mass, pleural effusion, or pneumothorax.  No acute bony abnormalities are identified.  IMPRESSION: No evidence of active cardiopulmonary disease.   Electronically Signed   By: Laveda Abbe M.D.   On: 10/25/2012 18:31    EKG Interpretation     Ventricular Rate:  98 PR Interval:  176 QRS Duration: 97 QT Interval:  371 QTC Calculation: 474 R Axis:   44 Text Interpretation:  Sinus rhythm axis is normal. no ST elevation or depression. no LVH. since last tracing no significant change            MDM   1. Allergic reaction, initial encounter    The etiology of the patient's chest pain is uncertain at this  time, it could be related to her allergic reaction. She does have hypertension which is untreated and is currently 170/100 but at the same time she is very anxious, she will be given the cocktail for her allergic reaction treatment, she does not appear anaphylactic at this time,  unsure of the exact exposure as she has started no new medications, no new oral ingestions, no new topical preparations or clothing.  Pt has improved significant after medications - VS have totally normalized - she is comfortable going home.  No obvious source of reaction identified - encouraged allergy testing ast outpt.  Meds given in ED:  Medications  methylPREDNISolone sodium succinate (SOLU-MEDROL) 125 mg/2 mL injection 125 mg (125 mg Intravenous Given 10/25/12 1802)  diphenhydrAMINE (BENADRYL) injection 25 mg (25 mg Intravenous Given 10/25/12 1802)  famotidine (PEPCID) IVPB 20 mg (0 mg Intravenous Stopped 10/25/12 2006)    New Prescriptions   EPINEPHRINE (EPIPEN 2-PAK) 0.3 MG/0.3 ML SOAJ INJECTION    Inject 0.3 mLs (0.3 mg total) into the muscle once as needed (for severe allergic reaction). CAll 911 immediately if you have to use this medicine   FAMOTIDINE (PEPCID) 20 MG TABLET    Take 1 tablet (20 mg total) by mouth 2 (two) times daily.   HYDROXYZINE (ATARAX/VISTARIL) 25 MG TABLET    Take 1 tablet (25 mg total) by mouth every 6 (six) hours.   PREDNISONE (DELTASONE) 20 MG TABLET    Take 2 tablets (40 mg total) by mouth daily.      Vida Roller, MD 10/25/12 2204

## 2012-10-25 NOTE — ED Notes (Signed)
Dr Miller at bedside. 

## 2012-10-25 NOTE — Patient Instructions (Signed)
Colonoscopy  A colonoscopy is an exam to evaluate your entire colon. In this exam, your colon is cleansed. A long fiberoptic tube is inserted through your rectum and into your colon. The fiberoptic scope (endoscope) is a long bundle of enclosed and very flexible fibers. These fibers transmit light to the area examined and send images from that area to your caregiver. Discomfort is usually minimal. You may be given a drug to help you sleep (sedative) during or prior to the procedure. This exam helps to detect lumps (tumors), polyps, inflammation, and areas of bleeding. Your caregiver may also take a small piece of tissue (biopsy) that will be examined under a microscope.  LET YOUR CAREGIVER KNOW ABOUT:   · Allergies to food or medicine.  · Medicines taken, including vitamins, herbs, eyedrops, over-the-counter medicines, and creams.  · Use of steroids (by mouth or creams).  · Previous problems with anesthetics or numbing medicines.  · History of bleeding problems or blood clots.  · Previous surgery.  · Other health problems, including diabetes and kidney problems.  · Possibility of pregnancy, if this applies.  BEFORE THE PROCEDURE   · A clear liquid diet may be required for 2 days before the exam.  · Ask your caregiver about changing or stopping your regular medications.  · Liquid injections (enemas) or laxatives may be required.  · A large amount of electrolyte solution may be given to you to drink over a short period of time. This solution is used to clean out your colon.  · You should be present 60 minutes prior to your procedure or as directed by your caregiver.  AFTER THE PROCEDURE   · If you received a sedative or pain relieving medication, you will need to arrange for someone to drive you home.  · Occasionally, there is a little blood passed with the first bowel movement. Do not be concerned.  FINDING OUT THE RESULTS OF YOUR TEST  Not all test results are available during your visit. If your test results are  not back during the visit, make an appointment with your caregiver to find out the results. Do not assume everything is normal if you have not heard from your caregiver or the medical facility. It is important for you to follow up on all of your test results.  HOME CARE INSTRUCTIONS   · It is not unusual to pass moderate amounts of gas and experience mild abdominal cramping following the procedure. This is due to air being used to inflate your colon during the exam. Walking or a warm pack on your belly (abdomen) may help.  · You may resume all normal meals and activities after sedatives and medicines have worn off.  · Only take over-the-counter or prescription medicines for pain, discomfort, or fever as directed by your caregiver. Do not use aspirin or blood thinners if a biopsy was taken. Consult your caregiver for medicine usage if biopsies were taken.  SEEK IMMEDIATE MEDICAL CARE IF:   · You have a fever.  · You pass large blood clots or fill a toilet with blood following the procedure. This may also occur 10 to 14 days following the procedure. This is more likely if a biopsy was taken.  · You develop abdominal pain that keeps getting worse and cannot be relieved with medicine.  Document Released: 12/25/1999 Document Revised: 03/21/2011 Document Reviewed: 08/09/2007  [employer]® Patient Information ©2014 [employer].

## 2012-10-25 NOTE — Progress Notes (Signed)
Patient ID: Crystal Stafford, female   DOB: 1960-10-26, 52 y.o.   MRN: [id number] Patient Demographics  Crystal Stafford, is a 52 y.o. female  [id number]  [id number]  DOB - Dec 30, 1960  Chief Complaint  Patient presents with  . Follow-up        Subjective:   Crystal Stafford is a 52 y.o. female here today for a follow up visit. She came because she just turned 52 years old and she would like to be referred for colonoscopy. Mammogram and Pap smear were done recently this year, both were normal. Patient claims to have noticed generalized body itching the last 2 days, she's not sure of allergy to any food product. There is no body rash. She continue to smoke cigarette one pack per day, occasionally drinks alcohol. Patient has No headache, No chest pain, No abdominal pain - No Nausea, No new weakness tingling or numbness, No Cough - SOB.  ALLERGIES: Allergies  Allergen Reactions  . Doxycycline     REACTION: swelling and rash    PAST MEDICAL HISTORY: Past Medical History  Diagnosis Date  . Depression   . MRSA (methicillin resistant Staphylococcus aureus)     MEDICATIONS AT HOME: Prior to Admission medications   Medication Sig Start Date End Date Taking? Authorizing Provider  sertraline (ZOLOFT) 50 MG tablet Take 50 mg by mouth 2 (two) times daily.   Yes Historical Provider, MD  traZODone (DESYREL) 50 MG tablet Take 50 mg by mouth at bedtime.   Yes Historical Provider, MD  ibuprofen (ADVIL,MOTRIN) 800 MG tablet Take 800 mg by mouth every 8 (eight) hours as needed for pain. For pain    Historical Provider, MD  omeprazole (PRILOSEC) 20 MG capsule Take 1 capsule (20 mg total) by mouth daily. 06/11/12   Shanker Levora Dredge, MD  predniSONE (DELTASONE) 10 MG tablet Take 1 tablet (10 mg total) by mouth daily. 10/25/12   Jeanann Lewandowsky, MD  QUEtiapine (SEROQUEL) 25 MG tablet Take 16 tablets (400 mg total) by mouth at bedtime. 07/11/12   Dorothea Ogle, MD     Objective:   Filed Vitals:   10/25/12 1221  BP: 117/80  Pulse: 90  Temp: 98.3 F (36.8 C)  TempSrc: Oral  Resp: 16  Height: 5\' 4"  (1.626 m)  Weight: 170 lb (77.111 kg)  SpO2: 97%    Exam General appearance : Awake, alert, not in any distress. Speech Clear. Not toxic looking. A few scratch marks on upper limbs HEENT: Atraumatic and Normocephalic, pupils equally reactive to light and accomodation Neck: supple, no JVD. No cervical lymphadenopathy.  Chest:Good air entry bilaterally, no added sounds  CVS: S1 S2 regular, no murmurs.  Abdomen: Bowel sounds present, Non tender and not distended with no gaurding, rigidity or rebound. Extremities: B/L Lower Ext shows no edema, both legs are warm to touch Neurology: Awake alert, and oriented X 3, CN II-XII intact, Non focal Skin:No Rash Wounds:N/A   Data Review   CBC No results found for this basename: WBC, HGB, HCT, PLT, MCV, MCH, MCHC, RDW, NEUTRABS, LYMPHSABS, MONOABS, EOSABS, BASOSABS, BANDABS, BANDSABD,  in the last 168 hours  Chemistries   No results found for this basename: NA, K, CL, CO2, GLUCOSE, BUN, CREATININE, GFRCGP, CALCIUM, MG, AST, ALT, ALKPHOS, BILITOT,  in the last 168 hours ------------------------------------------------------------------------------------------------------------------ No results found for this basename: HGBA1C,  in the last 72 hours ------------------------------------------------------------------------------------------------------------------ No results found for this basename: CHOL, HDL, LDLCALC, TRIG, CHOLHDL, LDLDIRECT,  in the last 72 hours ------------------------------------------------------------------------------------------------------------------  No results found for this basename: TSH, T4TOTAL, FREET3, T3FREE, THYROIDAB,  in the last 72 hours ------------------------------------------------------------------------------------------------------------------ No results found for this basename: VITAMINB12, FOLATE,  FERRITIN, TIBC, IRON, RETICCTPCT,  in the last 72 hours  Coagulation profile  No results found for this basename: INR, PROTIME,  in the last 168 hours    Assessment & Plan   Patient Active Problem List   Diagnosis Date Noted  . Physical exam, routine 10/25/2012  . Allergy 10/25/2012  . Hypertension 04/06/2012  . Prediabetes 04/06/2012  . Peptic ulcer 04/06/2012  . INSOMNIA 02/04/2010  . SEBACEOUS CYST, SCALP 08/21/2009  . HYPERLIPIDEMIA 06/02/2009  . HEADACHE 06/02/2009  . DEPRESSION 05/19/2009  . FATIGUE 05/19/2009  . 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 TOBACCO ABUSE 11/24/2006  . PELVIC PAIN, RIGHT 11/24/2006  . ASTHMA 11/10/2005     Plan: Colonoscopy ordered  Prednisone 10 mg tablet by mouth daily for 3 days  Patient has been extensively counseled on nutrition and exercise She was beginning told that she is prediabetic and needs nutritional control, we will repeat hemoglobin A1c in 3 months  Patient was extensively counseled about smoking cessation  Health Maintenance -Colonoscopy: Ordered today -Pap Smear: Most recently done less than a year ago, normal -Mammogram: Recently done and normal -Vaccinations:  -Influenza given  Follow up in 3 months or when necessary   The patient was given clear instructions to go to ER or return to medical center if symptoms don't improve, worsen or new problems develop. The patient verbalized understanding. The patient was told to call to get lab results if they haven't heard anything in the next week.    Jeanann Lewandowsky, MD, MHA, FACP, FAAP [facility] [place], [place] [phone]   10/25/2012, 1:30 PM

## 2012-10-25 NOTE — ED Notes (Signed)
Pt noticed hives yesterday morning.  Today she noticed they showed up on her face and back.  States she feels like a knot is in her throat.  Airway patent.

## 2012-10-25 NOTE — Progress Notes (Signed)
Pt is here for a f/u visit. Pt reports that she is breaking out in hives. Pt is also requesting a colonoscopy.

## 2012-10-25 NOTE — ED Notes (Signed)
Pt started c/o sharp, anterior chest pain. Became quite anxious. Moved to POD A-1.

## 2012-11-15 ENCOUNTER — Other Ambulatory Visit: Payer: Self-pay

## 2012-12-05 ENCOUNTER — Emergency Department (HOSPITAL_COMMUNITY): Payer: Medicare Other

## 2012-12-05 ENCOUNTER — Encounter (HOSPITAL_COMMUNITY): Payer: Self-pay | Admitting: Emergency Medicine

## 2012-12-05 ENCOUNTER — Emergency Department (HOSPITAL_COMMUNITY)
Admission: EM | Admit: 2012-12-05 | Discharge: 2012-12-05 | Disposition: A | Discharge status: Home / Self Care | Payer: Medicare Other | Attending: Emergency Medicine | Admitting: Emergency Medicine

## 2012-12-05 DIAGNOSIS — K219 Gastro-esophageal reflux disease without esophagitis: Secondary | ICD-10-CM | POA: Insufficient documentation

## 2012-12-05 DIAGNOSIS — J039 Acute tonsillitis, unspecified: Secondary | ICD-10-CM | POA: Insufficient documentation

## 2012-12-05 DIAGNOSIS — Z8614 Personal history of Methicillin resistant Staphylococcus aureus infection: Secondary | ICD-10-CM | POA: Insufficient documentation

## 2012-12-05 DIAGNOSIS — F329 Major depressive disorder, single episode, unspecified: Secondary | ICD-10-CM | POA: Insufficient documentation

## 2012-12-05 DIAGNOSIS — Z79899 Other long term (current) drug therapy: Secondary | ICD-10-CM | POA: Insufficient documentation

## 2012-12-05 DIAGNOSIS — IMO0002 Reserved for concepts with insufficient information to code with codable children: Secondary | ICD-10-CM | POA: Insufficient documentation

## 2012-12-05 DIAGNOSIS — F17200 Nicotine dependence, unspecified, uncomplicated: Secondary | ICD-10-CM | POA: Insufficient documentation

## 2012-12-05 DIAGNOSIS — F3289 Other specified depressive episodes: Secondary | ICD-10-CM | POA: Insufficient documentation

## 2012-12-05 HISTORY — DX: Essential (primary) hypertension: I10

## 2012-12-05 LAB — BASIC METABOLIC PANEL
BUN: 13 mg/dL (ref 6–23)
CO2: 27 mEq/L (ref 19–32)
Calcium: 9.5 mg/dL (ref 8.4–10.5)
Chloride: 106 mEq/L (ref 96–112)
Creatinine, Ser: 1.11 mg/dL — ABNORMAL HIGH (ref 0.50–1.10)
GFR calc Af Amer: 66 mL/min — ABNORMAL LOW (ref >90)
GFR calc non Af Amer: 57 mL/min — ABNORMAL LOW (ref >90)
Glucose, Bld: 107 mg/dL — ABNORMAL HIGH (ref 70–99)
Potassium: 5.1 mEq/L (ref 3.5–5.1)
Sodium: 143 mEq/L (ref 135–145)

## 2012-12-05 LAB — CBC WITH DIFFERENTIAL/PLATELET
Basophils Absolute: 0 10*3/uL (ref 0.0–0.1)
Basophils Relative: 0 % (ref 0–1)
Eosinophils Absolute: 0.1 10*3/uL (ref 0.0–0.7)
Eosinophils Relative: 1 % (ref 0–5)
HCT: 42.2 % (ref 36.0–46.0)
Hemoglobin: 14.2 g/dL (ref 12.0–15.0)
Lymphocytes Relative: 36 % (ref 12–46)
Lymphs Abs: 3.3 10*3/uL (ref 0.7–4.0)
MCH: 29.8 pg (ref 26.0–34.0)
MCHC: 33.6 g/dL (ref 30.0–36.0)
MCV: 88.5 fL (ref 78.0–100.0)
Monocytes Absolute: 0.8 10*3/uL (ref 0.1–1.0)
Monocytes Relative: 8 % (ref 3–12)
Neutro Abs: 5.1 10*3/uL (ref 1.7–7.7)
Neutrophils Relative %: 55 % (ref 43–77)
Platelets: 271 10*3/uL (ref 150–400)
RBC: 4.77 MIL/uL (ref 3.87–5.11)
RDW: 15 % (ref 11.5–15.5)
WBC: 9.2 10*3/uL (ref 4.0–10.5)

## 2012-12-05 MED ORDER — SODIUM CHLORIDE 0.9 % IV BOLUS (SEPSIS)
1000.0000 mL | Freq: Once | INTRAVENOUS | Status: AC
  Administered 2012-12-05: 1000 mL via INTRAVENOUS

## 2012-12-05 MED ORDER — HYDROCODONE-ACETAMINOPHEN 5-325 MG PO TABS: 1.0000 | ORAL_TABLET | ORAL | Status: DC | PRN

## 2012-12-05 MED ORDER — MORPHINE SULFATE 4 MG/ML IJ SOLN
4.0000 mg | Freq: Once | INTRAMUSCULAR | Status: AC
  Administered 2012-12-05: 4 mg via INTRAVENOUS
  Filled 2012-12-05: qty 1

## 2012-12-05 MED ORDER — ONDANSETRON 4 MG PO TBDP
4.0000 mg | ORAL_TABLET | Freq: Once | ORAL | Status: AC
  Administered 2012-12-05: 4 mg via ORAL
  Filled 2012-12-05: qty 1

## 2012-12-05 MED ORDER — IBUPROFEN 800 MG PO TABS: 800.0000 mg | ORAL_TABLET | Freq: Three times a day (TID) | ORAL | Status: DC

## 2012-12-05 MED ORDER — LIDOCAINE VISCOUS 2 % MT SOLN
15.0000 mL | Freq: Once | OROMUCOSAL | Status: AC
  Administered 2012-12-05: 15 mL via OROMUCOSAL
  Filled 2012-12-05: qty 15

## 2012-12-05 MED ORDER — IOHEXOL 300 MG/ML  SOLN
79.0000 mL | Freq: Once | INTRAMUSCULAR | Status: AC | PRN
  Administered 2012-12-05: 79 mL via INTRAVENOUS

## 2012-12-05 MED ORDER — KETOROLAC TROMETHAMINE 60 MG/2ML IM SOLN
60.0000 mg | Freq: Once | INTRAMUSCULAR | Status: AC
  Administered 2012-12-05: 60 mg via INTRAMUSCULAR
  Filled 2012-12-05: qty 2

## 2012-12-05 MED ORDER — AMOXICILLIN-POT CLAVULANATE 875-125 MG PO TABS: 1.0000 | ORAL_TABLET | Freq: Two times a day (BID) | ORAL | Status: DC

## 2012-12-05 NOTE — ED Provider Notes (Signed)
TIME SEEN: 7:45 AM  CHIEF COMPLAINT: Sore throat  HPI: Patient is a 52 year old female with a history of depression, GERD who presents the emergency department with sore throat that started this morning. She states it does hurt bilaterally but worse on the left side. She is having a difficult time swallowing secondary to pain. No difficulty breathing. No voice changes. Denies any dental pain. No sick contacts but she does have young grandchildren. No fevers or chills. She has had nausea but no vomiting or diarrhea. No neck stiffness, headache.  Pt reports it hurts to swallow and she is spitting her secretions in an emesis bag.  ROS: See HPI Constitutional: no fever  Eyes: no drainage  ENT: no runny nose   Cardiovascular:  no chest pain  Resp: no SOB  GI: no vomiting GU: no dysuria Integumentary: no rash  Allergy: no hives  Musculoskeletal: no leg swelling  Neurological: no slurred speech ROS otherwise negative  PAST MEDICAL HISTORY/PAST SURGICAL HISTORY:  Past Medical History  Diagnosis Date  . Depression   . MRSA (methicillin resistant Staphylococcus aureus)     MEDICATIONS:  Prior to Admission medications   Medication Sig Start Date End Date Taking? Authorizing Provider  diphenhydrAMINE (BENADRYL) 25 MG tablet Take 50 mg by mouth every 6 (six) hours as needed for itching.    Historical Provider, MD  EPINEPHrine (EPIPEN 2-PAK) 0.3 mg/0.3 mL SOAJ injection Inject 0.3 mLs (0.3 mg total) into the muscle once as needed (for severe allergic reaction). CAll 911 immediately if you have to use this medicine 10/25/12   Vida Roller, MD  famotidine (PEPCID) 20 MG tablet Take 1 tablet (20 mg total) by mouth 2 (two) times daily. 10/25/12   Vida Roller, MD  hydrOXYzine (ATARAX/VISTARIL) 25 MG tablet Take 1 tablet (25 mg total) by mouth every 6 (six) hours. 10/25/12   Vida Roller, MD  ibuprofen (ADVIL,MOTRIN) 800 MG tablet Take 800 mg by mouth every 8 (eight) hours as needed for pain.  For pain    Historical Provider, MD  omeprazole (PRILOSEC) 20 MG capsule Take 1 capsule (20 mg total) by mouth daily. 06/11/12   Shanker Levora Dredge, MD  predniSONE (DELTASONE) 10 MG tablet Take 1 tablet (10 mg total) by mouth daily. 10/25/12   Jeanann Lewandowsky, MD  predniSONE (DELTASONE) 20 MG tablet Take 2 tablets (40 mg total) by mouth daily. 10/25/12   Vida Roller, MD  sertraline (ZOLOFT) 50 MG tablet Take 50 mg by mouth 2 (two) times daily.    Historical Provider, MD  traZODone (DESYREL) 50 MG tablet Take 50 mg by mouth at bedtime.    Historical Provider, MD    ALLERGIES:  Allergies  Allergen Reactions  . Doxycycline     REACTION: swelling and rash    SOCIAL HISTORY:  History  Substance Use Topics  . Smoking status: Current Every Day Smoker -- 1.00 packs/day    Types: Cigarettes  . Smokeless tobacco: Not on file  . Alcohol Use: No    FAMILY HISTORY: History reviewed. No pertinent family history.  EXAM: BP 109/91  Pulse 93  Temp(Src) 97.7 F (36.5 C) (Oral)  Resp 20  SpO2 100% CONSTITUTIONAL: Alert and oriented and responds appropriately to questions. Well-appearing; well-nourished HEAD: Normocephalic EYES: Conjunctivae clear, PERRL ENT: normal nose; no rhinorrhea; moist mucous membranes; patient has erythematous posterior oropharynx with mild bilateral tonsillar hypertrophy with exudate, no uvular deviation, no unilateral swelling of her tonsils, no trismus, no drooling, no dental  abscess appreciated, no dental pain, TMs are clear bilaterally NECK: Supple, no meningismus, patient has bilateral anterior cervical lymphadenopathy CARD: RRR; S1 and S2 appreciated; no murmurs, no clicks, no rubs, no gallops RESP: Normal chest excursion without splinting or tachypnea; breath sounds clear and equal bilaterally; no wheezes, no rhonchi, no rales,  ABD/GI: Normal bowel sounds; non-distended; soft, non-tender, no rebound, no guarding BACK:  The back appears normal and is  non-tender to palpation, there is no CVA tenderness EXT: Normal ROM in all joints; non-tender to palpation; no edema; normal capillary refill; no cyanosis    SKIN: Normal color for age and race; warm NEURO: Moves all extremities equally PSYCH: The patient's mood and manner are appropriate. Grooming and personal hygiene are appropriate.  MEDICAL DECISION MAKING: Patient with sore throat that started this morning. She is hemodynamically stable. No obvious sign for PTA, retropharyngeal abscess. We'll obtain strep swab and give pain and nausea medication.  I am concerned the patient is complaining of more left-sided pain and is not swallowing her secretions. If strep swab is negative, may need CT soft tissue neck.  ED PROGRESS: Strep swab negative.  Will obtain labs and contrasted CT of patient's neck to rule out deep space infection given he is complaining of unilateral pain and is not swallowing her secretions.   CT scan shows tonsillitis, worse on left than right. She does have reactive lymphadenopathy. No deep space infection or abscess. We'll discharge her with amoxicillin, return precautions, pain medication and PCP followup. Patient verbalized understanding and is comfortable with plan.  Layla Maw Zylen Wenig, DO 12/05/12 1148

## 2012-12-05 NOTE — ED Notes (Signed)
Pt states sore throat. Pt states hurts in left side of neck and hurts to hold head back. Pt states she woke up with it early this morning. Pt denies n/v/d. Pt denies fever and chills. Pt states hx of acid reflux. Pt alert and mentating appropriately. Skin warm and dry.

## 2012-12-07 LAB — CULTURE, GROUP A STREP

## 2013-02-01 ENCOUNTER — Emergency Department (HOSPITAL_COMMUNITY)
Admission: EM | Admit: 2013-02-01 | Discharge: 2013-02-01 | Disposition: A | Discharge status: Home / Self Care | Payer: Medicare Other | Attending: Emergency Medicine | Admitting: Emergency Medicine

## 2013-02-01 ENCOUNTER — Encounter (HOSPITAL_COMMUNITY): Payer: Self-pay | Admitting: Emergency Medicine

## 2013-02-01 DIAGNOSIS — Z79899 Other long term (current) drug therapy: Secondary | ICD-10-CM | POA: Insufficient documentation

## 2013-02-01 DIAGNOSIS — I10 Essential (primary) hypertension: Secondary | ICD-10-CM | POA: Insufficient documentation

## 2013-02-01 DIAGNOSIS — F3289 Other specified depressive episodes: Secondary | ICD-10-CM | POA: Insufficient documentation

## 2013-02-01 DIAGNOSIS — F17200 Nicotine dependence, unspecified, uncomplicated: Secondary | ICD-10-CM | POA: Insufficient documentation

## 2013-02-01 DIAGNOSIS — J029 Acute pharyngitis, unspecified: Secondary | ICD-10-CM | POA: Insufficient documentation

## 2013-02-01 DIAGNOSIS — Z8614 Personal history of Methicillin resistant Staphylococcus aureus infection: Secondary | ICD-10-CM | POA: Insufficient documentation

## 2013-02-01 DIAGNOSIS — F329 Major depressive disorder, single episode, unspecified: Secondary | ICD-10-CM | POA: Insufficient documentation

## 2013-02-01 LAB — RAPID STREP SCREEN (MED CTR MEBANE ONLY): STREPTOCOCCUS, GROUP A SCREEN (DIRECT): NEGATIVE

## 2013-02-01 MED ORDER — LIDOCAINE VISCOUS 2 % MT SOLN
15.0000 mL | Freq: Once | OROMUCOSAL | Status: DC
  Filled 2013-02-01: qty 15

## 2013-02-01 MED ORDER — KETOROLAC TROMETHAMINE 60 MG/2ML IM SOLN
60.0000 mg | Freq: Once | INTRAMUSCULAR | Status: AC
  Administered 2013-02-01: 60 mg via INTRAMUSCULAR
  Filled 2013-02-01: qty 2

## 2013-02-01 NOTE — ED Notes (Signed)
The pt has had pain and swelling to the rt side of her throat for one week.  Unknown temp

## 2013-02-01 NOTE — ED Provider Notes (Signed)
CSN: [id number]     Arrival date & time 02/01/13  2150 History  This chart was scribed for non-physician practitioner Noland Fordyce, PA-C working with Veryl Speak, MD by Eston Mould, ED Scribe. This patient was seen in room TR07C/TR07C and the patient's care was started at 10:08 PM .   Chief Complaint  Patient presents with  . Sore Throat   The history is provided by the patient. No language interpreter was used.   HPI Comments: Crystal Stafford is a 53 y.o. female who presents to the Emergency Department complaining of ongoing sore throat that began 3 days ago. Pt states she was seen in the ED last year (Nov 2014) and states she may have Tonsillitis. She states she now has the same sx that states initially began on the R side and now has spread to the L side. Pt states she has been doing peroxide & salt water gargles, listerin and cough drops. Pt states she has a PCP. Pt denies any recent sick contacts. Pt denies fever, nausea, emesis. Reports being advised she may need tonsils removed, however has not f/u with ENT as she has not f/u with PCP yet for a referral.  Pt states she has been having heart burn.   Past Medical History  Diagnosis Date  . Depression   . MRSA (methicillin resistant Staphylococcus aureus)   . Hypertension    Past Surgical History  Procedure Laterality Date  . Abdominal hysterectomy     No family history on file. History  Substance Use Topics  . Smoking status: Current Every Day Smoker -- 1.00 packs/day    Types: Cigarettes  . Smokeless tobacco: Not on file  . Alcohol Use: No   OB History   Grav Para Term Preterm Abortions TAB SAB Ect Mult Living                 Review of Systems  Constitutional: Negative for fever.  HENT: Positive for sore throat.   Gastrointestinal: Negative for nausea and vomiting.  All other systems reviewed and are negative.    Allergies  Doxycycline  Home Medications   Current Outpatient Rx  Name  Route  Sig   Dispense  Refill  . EPINEPHrine (EPIPEN IJ)   Injection   Inject 1 each as directed once as needed (for allergic reaction).         . sertraline (ZOLOFT) 50 MG tablet   Oral   Take 100 mg by mouth daily.          . traZODone (DESYREL) 50 MG tablet   Oral   Take 50 mg by mouth at bedtime.          BP 145/91  Pulse 88  Temp(Src) 97.8 F (36.6 C) (Oral)  Resp 17  Ht 5\' 4"  (1.626 m)  Wt 180 lb 7 oz (81.846 kg)  BMI 30.96 kg/m2  SpO2 99%  Physical Exam  Nursing note and vitals reviewed. Constitutional: She is oriented to person, place, and time. She appears well-developed and well-nourished.  HENT:  Head: Normocephalic and atraumatic.  Tonsil erythema and exudate.  Eyes: EOM are normal.  Neck: Normal range of motion.  Cardiovascular: Normal rate.   Pulmonary/Chest: Effort normal.  Musculoskeletal: Normal range of motion.  Neurological: She is alert and oriented to person, place, and time.  Skin: Skin is warm and dry.  Psychiatric: She has a normal mood and affect. Her behavior is normal.    ED Course  Procedures  DIAGNOSTIC  STUDIES: Oxygen Saturation is 100% on RA, normal by my interpretation.    COORDINATION OF CARE: 10:11 PM-Discussed treatment plan which includes rapid strep test and administer anti-inflammatory pt agreed to plan.   Labs Review Labs Reviewed  RAPID STREP SCREEN  CULTURE, GROUP A STREP   Imaging Review No results found.  EKG Interpretation   None      MDM   1. Viral pharyngitis    Pt presenting with sore throat. On exam pt able to manage secretions. No respiratory distress. No tonsillar abscess seen on exam. Rapid strep: negative. Offered pt toradol and viscous lidocaine. Pt stated she did not want lidocaine as she has had it before and it tastes bad and does not provide relief. Advised pt to f/u with PCP on Monday if not improving. Provided pt info for ENT f/u. Return precautions provided. Pt verbalized understanding and agreement  with tx plan.     I personally performed the services described in this documentation, which was scribed in my presence. The recorded information has been reviewed and is accurate.    Noland Fordyce, PA-C 02/01/13 2320

## 2013-02-01 NOTE — ED Provider Notes (Signed)
Medical screening examination/treatment/procedure(s) were performed by non-physician practitioner and as supervising physician I was immediately available for consultation/collaboration.     Veryl Speak, MD 02/01/13 [phone]

## 2013-02-03 LAB — CULTURE, GROUP A STREP

## 2013-02-06 ENCOUNTER — Telehealth: Payer: Self-pay | Admitting: Internal Medicine

## 2013-02-06 NOTE — Telephone Encounter (Signed)
Do any of providers have this paperwork?

## 2013-02-06 NOTE — Telephone Encounter (Signed)
Pt says company named [employer], [phone] has faxed over paperwork regarding shoulder and knee brace, which needs to be filled out by MD explaining that pt needs brace.  Pt has appointment on 2/9 but wanted to know whether we received forms.

## 2013-02-11 NOTE — Telephone Encounter (Signed)
[facility], health [facility] will be refaxing script for shoulder knee brace.

## 2013-02-18 ENCOUNTER — Encounter: Payer: Self-pay | Admitting: Internal Medicine

## 2013-02-18 ENCOUNTER — Ambulatory Visit: Payer: Medicare Other | Attending: Internal Medicine | Admitting: Internal Medicine

## 2013-02-18 VITALS — BP 122/81 | HR 89 | Temp 99.1°F | Resp 16 | Ht 64.0 in | Wt 181.0 lb

## 2013-02-18 DIAGNOSIS — F3289 Other specified depressive episodes: Secondary | ICD-10-CM | POA: Insufficient documentation

## 2013-02-18 DIAGNOSIS — F329 Major depressive disorder, single episode, unspecified: Secondary | ICD-10-CM | POA: Insufficient documentation

## 2013-02-18 DIAGNOSIS — Z0000 Encounter for general adult medical examination without abnormal findings: Secondary | ICD-10-CM

## 2013-02-18 DIAGNOSIS — M25511 Pain in right shoulder: Secondary | ICD-10-CM

## 2013-02-18 DIAGNOSIS — R7303 Prediabetes: Secondary | ICD-10-CM

## 2013-02-18 DIAGNOSIS — Z79899 Other long term (current) drug therapy: Secondary | ICD-10-CM | POA: Insufficient documentation

## 2013-02-18 DIAGNOSIS — Z8614 Personal history of Methicillin resistant Staphylococcus aureus infection: Secondary | ICD-10-CM | POA: Insufficient documentation

## 2013-02-18 DIAGNOSIS — I10 Essential (primary) hypertension: Secondary | ICD-10-CM | POA: Insufficient documentation

## 2013-02-18 DIAGNOSIS — M25519 Pain in unspecified shoulder: Secondary | ICD-10-CM | POA: Insufficient documentation

## 2013-02-18 DIAGNOSIS — R7309 Other abnormal glucose: Secondary | ICD-10-CM | POA: Insufficient documentation

## 2013-02-18 LAB — COMPLETE METABOLIC PANEL WITH GFR
ALK PHOS: 107 U/L (ref 39–117)
ALT: 21 U/L (ref 0–35)
AST: 28 U/L (ref 0–37)
Albumin: 4.3 g/dL (ref 3.5–5.2)
BUN: 10 mg/dL (ref 6–23)
CO2: 26 mEq/L (ref 19–32)
CREATININE: 1.04 mg/dL (ref 0.50–1.10)
Calcium: 9.1 mg/dL (ref 8.4–10.5)
Chloride: 106 mEq/L (ref 96–112)
GFR, Est African American: 72 mL/min
GFR, Est Non African American: 63 mL/min
Glucose, Bld: 86 mg/dL (ref 70–99)
POTASSIUM: 4.6 meq/L (ref 3.5–5.3)
Sodium: 140 mEq/L (ref 135–145)
Total Bilirubin: 0.4 mg/dL (ref 0.2–1.2)
Total Protein: 7.4 g/dL (ref 6.0–8.3)

## 2013-02-18 LAB — LIPID PANEL
CHOL/HDL RATIO: 4.2 ratio
CHOLESTEROL: 193 mg/dL (ref 0–200)
HDL: 46 mg/dL (ref >39)
LDL Cholesterol: 133 mg/dL — ABNORMAL HIGH (ref 0–99)
TRIGLYCERIDES: 72 mg/dL (ref <150)
VLDL: 14 mg/dL (ref 0–40)

## 2013-02-18 MED ORDER — TRAMADOL HCL 50 MG PO TABS: 50.0000 mg | ORAL_TABLET | Freq: Three times a day (TID) | ORAL | Status: DC | PRN

## 2013-02-18 NOTE — Progress Notes (Signed)
Pt is here following up on her chronic right shoulder pain. Pt is requesting to have a colonoscopy.

## 2013-02-18 NOTE — Progress Notes (Signed)
Patient ID: Crystal Stafford, female   DOB: [birth year], 53 y.o.   MRN: [id number]   Crystal Stafford, is a 53 y.o. female  [id number]  [id number]  DOB - 28-Aug-1960  No chief complaint on file.       Subjective:   Crystal Stafford is a 53 y.o. female here today for a follow up visit. Major complaint today is right shoulder pain and stiffness with reduced range of motion which has been going on for quite a while. Patient was seen by orthopedic surgeon recently and was given 2 options, one is to have intra-articular injection, the second option was surgery. Patient did not use any of the options, she continue to be in pain. She is here today for possibility of pain medication, she also wants to have her colonoscopy will be ordered because she has not gone for the previous one. She has no new weakness. She denies active depression, she denies suicidal ideation or thoughts. She claims she goes for counseling every week and it's helping with her depression, she takes Zoloft 100 mg daily and trazodone at night for depression. Previous results showed that she was prediabetes, she has not been followed up since February 2014. She had quit smoking but she claims she's gaining too much weight since she quit in November 2014. Patient has No headache, No chest pain, No abdominal pain - No Nausea, No new weakness tingling or numbness, No Cough - SOB.  Problem  Shoulder Pain, Right    ALLERGIES: Allergies  Allergen Reactions  . Doxycycline Swelling and Rash    PAST MEDICAL HISTORY: Past Medical History  Diagnosis Date  . Depression   . MRSA (methicillin resistant Staphylococcus aureus)   . Hypertension     MEDICATIONS AT HOME: Prior to Admission medications   Medication Sig Start Date End Date Taking? Authorizing Provider  sertraline (ZOLOFT) 50 MG tablet Take 100 mg by mouth daily.    Yes Historical Provider, MD  traZODone (DESYREL) 50 MG tablet Take 50 mg by mouth at bedtime.   Yes Historical  Provider, MD  EPINEPHrine (EPIPEN IJ) Inject 1 each as directed once as needed (for allergic reaction).    Historical Provider, MD  traMADol (ULTRAM) 50 MG tablet Take 1 tablet (50 mg total) by mouth every 8 (eight) hours as needed. 02/18/13   Angelica Chessman, MD     Objective:   Filed Vitals:   02/18/13 0918  BP: 122/81  Pulse: 89  Temp: 99.1 F (37.3 C)  TempSrc: Oral  Resp: 16  Height: 5\' 4"  (1.626 m)  Weight: 181 lb (82.101 kg)  SpO2: 98%    Exam General appearance : Awake, alert, not in any distress. Speech Clear. Not toxic looking HEENT: Atraumatic and Normocephalic, pupils equally reactive to light and accomodation Neck: supple, no JVD. No cervical lymphadenopathy.  Chest:Good air entry bilaterally, no added sounds  CVS: S1 S2 regular, no murmurs.  Abdomen: Bowel sounds present, Non tender and not distended with no gaurding, rigidity or rebound. Extremities: Restricted range of motion on right shoulder joint due to stiffness and pain. Attempts to rotate the shoulder elicited significant tenderness. B/L Lower Ext shows no edema, both legs are warm to touch Neurology: Awake alert, and oriented X 3, CN II-XII intact, Non focal Skin:No Rash Wounds:N/A  Data Review   Assessment & Plan   1. Physical exam, routine  - HM COLONOSCOPY - Ambulatory referral to Gastroenterology  2. Shoulder pain, right  - traMADol (ULTRAM) 50 MG tablet;  Take 1 tablet (50 mg total) by mouth every 8 (eight) hours as needed.  Dispense: 30 tablet; Refill: 0 Ambulatory referral to physical therapy Ambulatory referral to orthopedic surgery  3. Prediabetes Hemoglobin A1c Lipid panel CMP  Patient was extensively counseled about nutrition and exercise Return in about 3 months (around 05/18/2013), or if symptoms worsen or fail to improve.  The patient was given clear instructions to go to ER or return to medical center if symptoms don't improve, worsen or new problems develop. The patient  verbalized understanding. The patient was told to call to get lab results if they haven't heard anything in the next week.    Angelica Chessman, MD, [place], [place], [place] [facility] [place], [place]   02/18/2013, 10:08 AM

## 2013-02-20 ENCOUNTER — Telehealth: Payer: Self-pay | Admitting: *Deleted

## 2013-02-20 NOTE — Telephone Encounter (Signed)
I spoke to the pt and informed her about her lab results I encouraged a healthy diet and regular exercise.

## 2013-02-20 NOTE — Telephone Encounter (Signed)
Message copied by Joan Mayans on Wed Feb 20, 2013  4:38 PM ------      Message from: Tresa Garter      Created: Tue Feb 19, 2013  9:35 AM       Please inform patient that her laboratory tests results come back mostly within normal limit except for the cholesterol level that is slightly high. We encourage regular physical exercise at least 3 times a week and nutritional control with low cholesterol, low fat diet. We will repeat the test again in 6 months if it's the same we may need to start medication. ------

## 2013-02-21 ENCOUNTER — Encounter: Payer: Self-pay | Admitting: Internal Medicine

## 2013-02-25 ENCOUNTER — Ambulatory Visit: Payer: Medicare Other

## 2013-03-04 ENCOUNTER — Ambulatory Visit: Payer: Medicare Other | Attending: Internal Medicine | Admitting: Physical Therapy

## 2013-03-04 DIAGNOSIS — M25519 Pain in unspecified shoulder: Secondary | ICD-10-CM | POA: Insufficient documentation

## 2013-03-04 DIAGNOSIS — M6281 Muscle weakness (generalized): Secondary | ICD-10-CM | POA: Insufficient documentation

## 2013-03-04 DIAGNOSIS — R5381 Other malaise: Secondary | ICD-10-CM | POA: Insufficient documentation

## 2013-03-04 DIAGNOSIS — R293 Abnormal posture: Secondary | ICD-10-CM | POA: Insufficient documentation

## 2013-03-04 DIAGNOSIS — IMO0001 Reserved for inherently not codable concepts without codable children: Secondary | ICD-10-CM | POA: Insufficient documentation

## 2013-03-07 ENCOUNTER — Ambulatory Visit: Payer: Medicare Other | Admitting: Sports Medicine

## 2013-03-07 ENCOUNTER — Encounter: Payer: Medicare Other | Admitting: Rehabilitation

## 2013-03-11 ENCOUNTER — Ambulatory Visit: Payer: Medicare Other | Attending: Internal Medicine | Admitting: Physical Therapy

## 2013-03-11 DIAGNOSIS — IMO0001 Reserved for inherently not codable concepts without codable children: Secondary | ICD-10-CM | POA: Insufficient documentation

## 2013-03-11 DIAGNOSIS — M25519 Pain in unspecified shoulder: Secondary | ICD-10-CM | POA: Insufficient documentation

## 2013-03-11 DIAGNOSIS — R5381 Other malaise: Secondary | ICD-10-CM | POA: Insufficient documentation

## 2013-03-11 DIAGNOSIS — R293 Abnormal posture: Secondary | ICD-10-CM | POA: Insufficient documentation

## 2013-03-11 DIAGNOSIS — M6281 Muscle weakness (generalized): Secondary | ICD-10-CM | POA: Insufficient documentation

## 2013-03-14 ENCOUNTER — Encounter: Payer: Medicare Other | Admitting: Physical Therapy

## 2013-03-14 ENCOUNTER — Encounter: Payer: Self-pay | Admitting: Sports Medicine

## 2013-03-14 ENCOUNTER — Ambulatory Visit (INDEPENDENT_AMBULATORY_CARE_PROVIDER_SITE_OTHER): Payer: Medicare Other | Admitting: Sports Medicine

## 2013-03-14 VITALS — BP 119/85 | Ht 64.0 in | Wt 181.0 lb

## 2013-03-14 DIAGNOSIS — M25519 Pain in unspecified shoulder: Secondary | ICD-10-CM

## 2013-03-14 NOTE — Progress Notes (Addendum)
   Subjective:    Patient ID: Crystal Stafford, female    DOB: 1960/09/01, 53 y.o.   MRN: [id number]  HPI  Ms. Slezak presents complaining of a 4 month history of Right should pain and stiffness. Pain is worse lying down and with movement. She has limited range of motion due to pain. Has to use left arm to move right on occasion. She takes Tramadol at night which makes her sleepy but also improves pain significantly. During the day she has tried over the counter NSAIDs but states that these do not improve symptoms so she does not typically take anything. She ices her shoulder over a 2 hour period daily, doing 10 minutes on and 10 min off. Then she takes a warm shower. She states that ice helps some with the pain.   She has had 2 physical therapy appointments and does not acknowledge significant improvement but plans to keep going. She states that the pain is worse after PT appointments. She does not want an injection in her shoulder at this time.   Of note she has a history of Right shoulder injury from a fall about 2 years ago. No fractures or surgeries. She treated the pain with ice and eventually the pain went away.   Review of Systems Negative apart from HPI     Objective:   Physical Exam  Shoulder: LEFT:Nontender, Normal ROM RIGHT:  Inspection: symmetric, no atrophy.  Palpation: Tenderness at insertion of biceps tendon, and lateral shoulder.  ROM: Active ROM is diminished. Flexion to 80 degrees, Abduction 45 degrees, Internal rotation very limited. External rotation 60 degrees. Passive ROM is limited some due to pain. Flexion 130 degrees, Abduction 80 degrees, External 80 degrees, Internal unable to get arm behind back. No locking with external or internal rotation. Special Tests:  Subscapularis (internal rotation): decreased strength 4/5 Infraspinatus (external rotation): normal strength 5/5 Empty Can: normal strength at 0 degrees and 30 degrees  Hawkin's: unable to test as patient in  significant pain and unable to get into position  Right Should X-Ray (01/2012): Findings: The humerus is located and the acromioclavicular joint is  intact. There is no fracture. Subacromial spur is noted. Imaged right lung and ribs appear normal.  IMPRESSION: No acute finding. Subacromial spurring.     Assessment & Plan:   53 yo with Right Shoulder pain x 4 months.  1. Right Shoulder pain: likely Rotator Cuff inflammation; frozen shoulder less likely.   --Continue seeing PT and taking Tramadol at night (can take during the day if not driving/working) --Meloxicam 15 mg once daily in the AM for pain --Follow-up in 3 weeks to reevaluate. Although patient is not wanting a cortisone injection today, I did explain to her that if her symptoms are not improving by the time of her followup visit we will want to revisit this.  Seen with Jacqulyn Liner, MS4

## 2013-03-19 ENCOUNTER — Encounter: Payer: Medicare Other | Admitting: Physical Therapy

## 2013-03-20 ENCOUNTER — Ambulatory Visit (AMBULATORY_SURGERY_CENTER): Payer: Self-pay | Admitting: *Deleted

## 2013-03-20 VITALS — Ht 64.0 in | Wt 181.2 lb

## 2013-03-20 DIAGNOSIS — Z1211 Encounter for screening for malignant neoplasm of colon: Secondary | ICD-10-CM

## 2013-03-20 MED ORDER — MOVIPREP 100 G PO SOLR: ORAL | Status: DC

## 2013-03-20 NOTE — Progress Notes (Signed)
No allergies to eggs or soy. No problems with anesthesia.  

## 2013-03-21 ENCOUNTER — Telehealth: Payer: Self-pay | Admitting: Internal Medicine

## 2013-03-21 ENCOUNTER — Ambulatory Visit: Payer: Medicare Other | Admitting: Physical Therapy

## 2013-03-21 NOTE — Telephone Encounter (Signed)
Pt will come by office (LED4th floor) and pick up voucher for free MoviPrep

## 2013-04-03 ENCOUNTER — Ambulatory Visit (AMBULATORY_SURGERY_CENTER): Payer: Medicare Other | Admitting: Internal Medicine

## 2013-04-03 ENCOUNTER — Encounter: Payer: Self-pay | Admitting: Internal Medicine

## 2013-04-03 VITALS — BP 120/82 | HR 85 | Temp 97.1°F | Resp 35 | Ht 64.0 in | Wt 181.0 lb

## 2013-04-03 DIAGNOSIS — Z1211 Encounter for screening for malignant neoplasm of colon: Secondary | ICD-10-CM

## 2013-04-03 DIAGNOSIS — D126 Benign neoplasm of colon, unspecified: Secondary | ICD-10-CM | POA: Diagnosis not present

## 2013-04-03 MED ORDER — SODIUM CHLORIDE 0.9 % IV SOLN: 500.0000 mL | INTRAVENOUS | Status: DC

## 2013-04-03 NOTE — Progress Notes (Signed)
Called to room to assist during endoscopic procedure.  Patient ID and intended procedure confirmed with present staff. Received instructions for my participation in the procedure from the performing physician.  

## 2013-04-03 NOTE — Op Note (Signed)
[facility]  [address] [place], [id number]   COLONOSCOPY PROCEDURE REPORT  PATIENT: Crystal Stafford, Crystal Stafford  MR#: [id number] BIRTHDATE: [birth year] , 52  yrs. old GENDER: Female ENDOSCOPIST: Eustace Quail, MD REFERRED LF:[id number] Doreene Burke, M.D. PROCEDURE DATE:  04/03/2013 PROCEDURE:   Colonoscopy with snare polypectomy x 2 First Screening Colonoscopy - Avg.  risk and is 50 yrs.  old or older Yes.  Prior Negative Screening - Now for repeat screening. N/A  History of Adenoma - Now for follow-up colonoscopy & has been > or = to 3 yrs.  N/A  Polyps Removed Today? Yes. ASA CLASS:   Class II INDICATIONS:average risk screening. MEDICATIONS: MAC sedation, administered by CRNA and propofol (Diprivan) 350mg  IV  DESCRIPTION OF PROCEDURE:   After the risks benefits and alternatives of the procedure were thoroughly explained, informed consent was obtained.  A digital rectal exam revealed no abnormalities of the rectum.   The LB PFC-H190 [id number] and LB PFC-H190 [id number]  endoscope was introduced through the anus and advanced to the cecum, which was identified by both the appendix and ileocecal valve. No adverse events experienced.   The quality of the prep was excellent, using MoviPrep  The instrument was then slowly withdrawn as the colon was fully examined.  COLON FINDINGS: Two sessile polyps were found at the appendiceal orifice (68mm) and in the descending colon (60mm).  A polypectomy was performed with a cold snare.  The resection was complete and the polyp tissue was completely retrieved.   The colon mucosa was otherwise normal.  Retroflexed views revealed internal hemorrhoids. The time to cecum=2 minutes 22 seconds.  Withdrawal time=14 minutes 45 seconds.  The scope was withdrawn and the procedure completed.  COMPLICATIONS: There were no complications.  ENDOSCOPIC IMPRESSION: 1.   Two polyps  were found at the appendiceal orifice and descending colon; polypectomy  performed with a cold snare 2.   The colon mucosa was otherwise normal  RECOMMENDATIONS: 1. Repeat colonoscopy in 5 years if polyp adenomatous; otherwise 10 years   eSigned:  Eustace Quail, MD 04/03/2013 11:13 AM   cc: Angelica Chessman MD and The Patient

## 2013-04-03 NOTE — Progress Notes (Signed)
Lidocaine-40mg IV prior to Propofol InductionPropofol given over incremental dosages 

## 2013-04-03 NOTE — Patient Instructions (Signed)
Colon polyps x 2 removed today. See handout on polyps. Await pathology results. Resume current medications. Call us with any questions or concerns. Thank you!  YOU HAD AN ENDOSCOPIC PROCEDURE TODAY AT [facility]: Refer to the procedure report that was given to you for any specific questions about what was found during the examination.  If the procedure report does not answer your questions, please call your gastroenterologist to clarify.  If you requested that your care partner not be given the details of your procedure findings, then the procedure report has been included in a sealed envelope for you to review at your convenience later.  YOU SHOULD EXPECT: Some feelings of bloating in the abdomen. Passage of more gas than usual.  Walking can help get rid of the air that was put into your GI tract during the procedure and reduce the bloating. If you had a lower endoscopy (such as a colonoscopy or flexible sigmoidoscopy) you may notice spotting of blood in your stool or on the toilet paper. If you underwent a bowel prep for your procedure, then you may not have a normal bowel movement for a few days.  DIET: Your first meal following the procedure should be a light meal and then it is ok to progress to your normal diet.  A half-sandwich or bowl of soup is an example of a good first meal.  Heavy or fried foods are harder to digest and may make you feel nauseous or bloated.  Likewise meals heavy in dairy and vegetables can cause extra gas to form and this can also increase the bloating.  Drink plenty of fluids but you should avoid alcoholic beverages for 24 hours.  ACTIVITY: Your care partner should take you home directly after the procedure.  You should plan to take it easy, moving slowly for the rest of the day.  You can resume normal activity the day after the procedure however you should NOT DRIVE or use heavy machinery for 24 hours (because of the sedation medicines used during the test).     SYMPTOMS TO REPORT IMMEDIATELY: A gastroenterologist can be reached at any hour.  During normal business hours, 8:30 AM to 5:00 PM Monday through Friday, call [phone].  After hours and on weekends, please call the GI answering service at [phone] who will take a message and have the physician on call contact you.   Following lower endoscopy (colonoscopy or flexible sigmoidoscopy):  Excessive amounts of blood in the stool  Significant tenderness or worsening of abdominal pains  Swelling of the abdomen that is new, acute  Fever of 100F or higher  Following upper endoscopy (EGD)  Vomiting of blood or coffee ground material  New chest pain or pain under the shoulder blades  Painful or persistently difficult swallowing  New shortness of breath  Fever of 100F or higher  Black, tarry-looking stools  FOLLOW UP: If any biopsies were taken you will be contacted by phone or by letter within the next 1-3 weeks.  Call your gastroenterologist if you have not heard about the biopsies in 3 weeks.  Our staff will call the home number listed on your records the next business day following your procedure to check on you and address any questions or concerns that you may have at that time regarding the information given to you following your procedure. This is a courtesy call and so if there is no answer at the home number and we have not heard from you  through the emergency physician on call, we will assume that you have returned to your regular daily activities without incident.  SIGNATURES/CONFIDENTIALITY: You and/or your care partner have signed paperwork which will be entered into your electronic medical record.  These signatures attest to the fact that that the information above on your After Visit Summary has been reviewed and is understood.  Full responsibility of the confidentiality of this discharge information lies with you and/or your care-partner.

## 2013-04-04 ENCOUNTER — Ambulatory Visit: Payer: Medicare Other | Admitting: Sports Medicine

## 2013-04-04 ENCOUNTER — Telehealth: Payer: Self-pay | Admitting: *Deleted

## 2013-04-04 NOTE — Telephone Encounter (Signed)
  Follow up Call-  Call back number 04/03/2013  Post procedure Call Back phone  # [phone]  Permission to leave phone message No  comments NO ANSWERING MACHINE     Patient questions:  Do you have a fever, pain , or abdominal swelling? no Pain Score  0 *  Have you tolerated food without any problems? yes  Have you been able to return to your normal activities? yes  Do you have any questions about your discharge instructions: Diet   no Medications  no Follow up visit  no  Do you have questions or concerns about your Care? no  Actions: * If pain score is 4 or above: No action needed, pain <4.

## 2013-04-15 ENCOUNTER — Encounter: Payer: Self-pay | Admitting: Internal Medicine

## 2013-05-20 ENCOUNTER — Encounter: Payer: Self-pay | Admitting: Internal Medicine

## 2013-05-20 ENCOUNTER — Ambulatory Visit: Payer: Medicare Other | Attending: Internal Medicine | Admitting: Internal Medicine

## 2013-05-20 VITALS — BP 109/77 | HR 87 | Temp 98.0°F | Resp 16 | Ht 64.0 in | Wt 186.0 lb

## 2013-05-20 DIAGNOSIS — R202 Paresthesia of skin: Secondary | ICD-10-CM

## 2013-05-20 DIAGNOSIS — F3289 Other specified depressive episodes: Secondary | ICD-10-CM | POA: Diagnosis not present

## 2013-05-20 DIAGNOSIS — R209 Unspecified disturbances of skin sensation: Secondary | ICD-10-CM | POA: Insufficient documentation

## 2013-05-20 DIAGNOSIS — F329 Major depressive disorder, single episode, unspecified: Secondary | ICD-10-CM | POA: Diagnosis not present

## 2013-05-20 DIAGNOSIS — R7303 Prediabetes: Secondary | ICD-10-CM

## 2013-05-20 DIAGNOSIS — R7309 Other abnormal glucose: Secondary | ICD-10-CM | POA: Diagnosis not present

## 2013-05-20 DIAGNOSIS — R200 Anesthesia of skin: Secondary | ICD-10-CM | POA: Insufficient documentation

## 2013-05-20 DIAGNOSIS — Z87891 Personal history of nicotine dependence: Secondary | ICD-10-CM | POA: Diagnosis not present

## 2013-05-20 LAB — POCT GLYCOSYLATED HEMOGLOBIN (HGB A1C): Hemoglobin A1C: 5.6

## 2013-05-20 LAB — BASIC METABOLIC PANEL
BUN: 15 mg/dL (ref 6–23)
CALCIUM: 9.6 mg/dL (ref 8.4–10.5)
CO2: 24 meq/L (ref 19–32)
Chloride: 107 mEq/L (ref 96–112)
Creat: 1.08 mg/dL (ref 0.50–1.10)
GLUCOSE: 86 mg/dL (ref 70–99)
Potassium: 4.4 mEq/L (ref 3.5–5.3)
Sodium: 140 mEq/L (ref 135–145)

## 2013-05-20 MED ORDER — GABAPENTIN 100 MG PO CAPS: 100.0000 mg | ORAL_CAPSULE | Freq: Three times a day (TID) | ORAL | Status: DC

## 2013-05-20 NOTE — Progress Notes (Signed)
Pt is here following up on her HTN, depression and GERD. Pt states that for a month she has been having tingling and burning in her feet and legs.

## 2013-05-20 NOTE — Progress Notes (Signed)
Patient ID: Crystal Stafford, female   DOB: Oct 13, 1960, 53 y.o.   MRN: [id number]   Crystal Stafford, is a 53 y.o. female  YWV:[id number]  WNI:[id number]  DOB - 1960-07-13  Chief Complaint  Patient presents with  . Follow-up        Subjective:   Crystal Stafford is a 53 y.o. female here today for a follow up visit. Patient has history of depression on Zoloft and GERD, her major complaint today is tingling and numbness of the lower legs especially both feet. She is not known to be diabetic but her last hemoglobin A1c was 6.1% prediabetic level, she's not on medication. She describes the numbness and tingling as pinprick sensation which wakes her up at night.  No injury, no wounds, no rashes, no erythema. Her last colonoscopy was normal except for few polypectomy. She is up-to-date with her Pap smear and mammogram. She quit smoking last year and has been doing good. No joint swelling. Patient has No headache, No chest pain, No abdominal pain - No Nausea, No new weakness tingling or numbness, No Cough - SOB.  Problem  Numbness and Tingling    ALLERGIES: Allergies  Allergen Reactions  . Doxycycline Swelling and Rash    PAST MEDICAL HISTORY: Past Medical History  Diagnosis Date  . Depression   . MRSA (methicillin resistant Staphylococcus aureus)   . Hypertension   . GERD (gastroesophageal reflux disease)     MEDICATIONS AT HOME: Prior to Admission medications   Medication Sig Start Date End Date Taking? Authorizing Provider  EPINEPHrine (EPIPEN IJ) Inject 1 each as directed once as needed (for allergic reaction).    Historical Provider, MD  gabapentin (NEURONTIN) 100 MG capsule Take 1 capsule (100 mg total) by mouth 3 (three) times daily. 05/20/13   Angelica Chessman, MD  sertraline (ZOLOFT) 50 MG tablet Take 100 mg by mouth daily.     Historical Provider, MD     Objective:   Filed Vitals:   05/20/13 1147  BP: 109/77  Pulse: 87  Temp: 98 F (36.7 C)  TempSrc: Oral  Resp: 16    Height: 5\' 4"  (1.626 m)  Weight: 186 lb (84.369 kg)  SpO2: 100%    Exam General appearance : Awake, alert, not in any distress. Speech Clear. Not toxic looking HEENT: Atraumatic and Normocephalic, pupils equally reactive to light and accomodation Neck: supple, no JVD. No cervical lymphadenopathy.  Chest:Good air entry bilaterally, no added sounds  CVS: S1 S2 regular, no murmurs.  Abdomen: Bowel sounds present, Non tender and not distended with no gaurding, rigidity or rebound. Extremities: B/L Lower Ext shows no edema, both legs are warm to touch Neurology: Awake alert, and oriented X 3, CN II-XII intact, Non focal Skin:No Rash Wounds:N/A  Data Review Lab Results  Component Value Date   HGBA1C 6.1* 02/08/2012     Assessment & Plan   1. Prediabetes  - POCT glycosylated hemoglobin (Hb A1C): Hemoglobin A1c today is 5.6% Patient was informed that she is not diabetic, I encouraged to continue nutrition and exercise  - gabapentin (NEURONTIN) 100 MG capsule; Take 1 capsule (100 mg total) by mouth 3 (three) times daily.  Dispense: 90 capsule; Refill: 3  2. Numbness and tingling - Basic Metabolic Panel to rule out electrolyte imbalance - gabapentin (NEURONTIN) 100 MG capsule; Take 1 capsule (100 mg total) by mouth 3 (three) times daily.  Dispense: 90 capsule; Refill: 3  Patient was extensively counseled on nutrition and exercise  Return in about  6 months (around 11/20/2013), or if symptoms worsen or fail to improve, for Follow up Pain and comorbidities.  The patient was given clear instructions to go to ER or return to medical center if symptoms don't improve, worsen or new problems develop. The patient verbalized understanding. The patient was told to call to get lab results if they haven't heard anything in the next week.   This note has been created with Surveyor, quantity. Any transcriptional errors are unintentional.    Angelica Chessman, MD, [place], [place], [place] [facility] [place], [place]   05/20/2013, 12:19 PM

## 2013-05-27 ENCOUNTER — Telehealth: Payer: Self-pay | Admitting: *Deleted

## 2013-05-27 NOTE — Telephone Encounter (Signed)
Message copied by Joan Mayans on Mon May 27, 2013  4:03 PM ------      Message from: Tresa Garter      Created: Tue May 21, 2013  9:45 AM       Please inform patient that her basic metabolic panel shows normal ------

## 2013-05-27 NOTE — Telephone Encounter (Signed)
Pt is aware of her lab results.  

## 2013-05-31 ENCOUNTER — Encounter (HOSPITAL_COMMUNITY): Payer: Self-pay | Admitting: Emergency Medicine

## 2013-05-31 DIAGNOSIS — F3289 Other specified depressive episodes: Secondary | ICD-10-CM | POA: Insufficient documentation

## 2013-05-31 DIAGNOSIS — Z79899 Other long term (current) drug therapy: Secondary | ICD-10-CM | POA: Insufficient documentation

## 2013-05-31 DIAGNOSIS — Z87891 Personal history of nicotine dependence: Secondary | ICD-10-CM | POA: Insufficient documentation

## 2013-05-31 DIAGNOSIS — Y9389 Activity, other specified: Secondary | ICD-10-CM | POA: Insufficient documentation

## 2013-05-31 DIAGNOSIS — X58XXXA Exposure to other specified factors, initial encounter: Secondary | ICD-10-CM | POA: Insufficient documentation

## 2013-05-31 DIAGNOSIS — Y9289 Other specified places as the place of occurrence of the external cause: Secondary | ICD-10-CM | POA: Insufficient documentation

## 2013-05-31 DIAGNOSIS — Z8719 Personal history of other diseases of the digestive system: Secondary | ICD-10-CM | POA: Insufficient documentation

## 2013-05-31 DIAGNOSIS — S336XXA Sprain of sacroiliac joint, initial encounter: Secondary | ICD-10-CM | POA: Insufficient documentation

## 2013-05-31 DIAGNOSIS — Z8614 Personal history of Methicillin resistant Staphylococcus aureus infection: Secondary | ICD-10-CM | POA: Insufficient documentation

## 2013-05-31 DIAGNOSIS — I10 Essential (primary) hypertension: Secondary | ICD-10-CM | POA: Insufficient documentation

## 2013-05-31 DIAGNOSIS — F329 Major depressive disorder, single episode, unspecified: Secondary | ICD-10-CM | POA: Insufficient documentation

## 2013-05-31 MED ORDER — OXYCODONE-ACETAMINOPHEN 5-325 MG PO TABS
1.0000 | ORAL_TABLET | Freq: Once | ORAL | Status: AC
  Administered 2013-05-31: 1 via ORAL
  Filled 2013-05-31: qty 1

## 2013-05-31 NOTE — ED Notes (Signed)
Pt states back pain that started this morning and has not been made better with OTC medication.  Pt states increased urination

## 2013-06-01 ENCOUNTER — Emergency Department (HOSPITAL_COMMUNITY)
Admission: EM | Admit: 2013-06-01 | Discharge: 2013-06-01 | Disposition: A | Discharge status: Home / Self Care | Payer: Medicare Other | Attending: Emergency Medicine | Admitting: Emergency Medicine

## 2013-06-01 DIAGNOSIS — S39012A Strain of muscle, fascia and tendon of lower back, initial encounter: Secondary | ICD-10-CM

## 2013-06-01 LAB — URINALYSIS, ROUTINE W REFLEX MICROSCOPIC
Glucose, UA: NEGATIVE mg/dL
Hgb urine dipstick: NEGATIVE
KETONES UR: NEGATIVE mg/dL
Leukocytes, UA: NEGATIVE
NITRITE: NEGATIVE
PROTEIN: NEGATIVE mg/dL
Specific Gravity, Urine: 1.034 — ABNORMAL HIGH (ref 1.005–1.030)
Urobilinogen, UA: 1 mg/dL (ref 0.0–1.0)
pH: 5.5 (ref 5.0–8.0)

## 2013-06-01 MED ORDER — TRAMADOL HCL 50 MG PO TABS: 50.0000 mg | ORAL_TABLET | Freq: Four times a day (QID) | ORAL | Status: DC | PRN

## 2013-06-01 MED ORDER — IBUPROFEN 600 MG PO TABS: 600.0000 mg | ORAL_TABLET | Freq: Four times a day (QID) | ORAL | Status: DC | PRN

## 2013-06-01 MED ORDER — CYCLOBENZAPRINE HCL 10 MG PO TABS: 10.0000 mg | ORAL_TABLET | Freq: Two times a day (BID) | ORAL | Status: DC | PRN

## 2013-06-01 MED ORDER — KETOROLAC TROMETHAMINE 30 MG/ML IJ SOLN
30.0000 mg | Freq: Once | INTRAMUSCULAR | Status: AC
  Administered 2013-06-01: 30 mg via INTRAMUSCULAR
  Filled 2013-06-01: qty 1

## 2013-06-01 MED ORDER — KETOROLAC TROMETHAMINE 30 MG/ML IJ SOLN: 30.0000 mg | Freq: Once | INTRAMUSCULAR | Status: DC

## 2013-06-01 NOTE — ED Provider Notes (Signed)
CSN: [id number]     Arrival date & time 05/31/13  2231 History   First MD Initiated Contact with Patient 06/01/13 0246     Chief Complaint  Patient presents with  . Back Pain     (Consider location/radiation/quality/duration/timing/severity/associated sxs/prior Treatment) HPI A pleasant 53 year old woman with a history of hypertension and GERD. She presents with complaints of right-sided low back pain. She noticed her pain at 8 AM yesterday morning, shortly after she woke. The pain is nonradiating. Moderately severe. She denies paresthesias and motor weakness. She feels like she has had increased frequency of urination.No fever. No history of injury or strain. Patient notes that she had been going to the gym regularly but, has not made it there for a few weeks.   Past Medical History  Diagnosis Date  . Depression   . MRSA (methicillin resistant Staphylococcus aureus)   . Hypertension   . GERD (gastroesophageal reflux disease)    Past Surgical History  Procedure Laterality Date  . Abdominal hysterectomy     Family History  Problem Relation Age of Onset  . Colon cancer Maternal Grandfather 14  . Breast cancer Maternal Aunt    History  Substance Use Topics  . Smoking status: Former Smoker -- 1.00 packs/day    Types: Cigarettes    Quit date: 11/27/2012  . Smokeless tobacco: Never Used  . Alcohol Use: No   OB History   Grav Para Term Preterm Abortions TAB SAB Ect Mult Living                 Review of Systems Ten point review of symptoms performed and is negative with the exception of symptoms noted above.     Allergies  Doxycycline  Home Medications   Prior to Admission medications   Medication Sig Start Date End Date Taking? Authorizing Provider  EPINEPHrine (EPIPEN IJ) Inject 1 each as directed once as needed (for allergic reaction).   Yes Historical Provider, MD  ibuprofen (ADVIL,MOTRIN) 200 MG tablet Take 600 mg by mouth every 6 (six) hours as needed for moderate  pain.   Yes Historical Provider, MD  naproxen sodium (ANAPROX) 220 MG tablet Take 440 mg by mouth daily as needed (pain).   Yes Historical Provider, MD  sertraline (ZOLOFT) 50 MG tablet Take 100 mg by mouth daily.    Yes Historical Provider, MD  gabapentin (NEURONTIN) 100 MG capsule Take 1 capsule (100 mg total) by mouth 3 (three) times daily. 05/20/13   Angelica Chessman, MD   BP 129/86  Pulse 89  Temp(Src) 97.9 F (36.6 C) (Oral)  Resp 20  Wt 187 lb 11.2 oz (85.14 kg)  SpO2 100% Physical Exam Gen: well developed and well nourished appearing Head: NCAT Eyes: PERL, EOMI Nose: no epistaixis or rhinorrhea Mouth/throat: mucosa is moist and pink Neck: supple, no stridor Lungs: CTA B, no wheezing, rhonchi or rales CV: RRR, no murmur, extremities appear well perfused.  Abd: soft, notender, nondistended Back: no midline ttp, ttp over the paraspinal musculature with palpable muscle tension/trigger point on the right at approx the L4/L5 level, pain excacerbated with palpation.  Skin: warm and dry Ext: normal to inspection, no dependent edema Neuro: CN ii-xii grossly intact, no focal deficits, good motor strength both LE with normal DTRs Psyche; normal affect,  calm and cooperative.  ED Course  Procedures (including critical care time) Labs Review  Results for orders placed during the hospital encounter of 06/01/13 (from the past 24 hour(s))  URINALYSIS, ROUTINE W REFLEX  MICROSCOPIC     Status: Abnormal   Collection Time    06/01/13  1:58 AM      Result Value Ref Range   Color, Urine YELLOW  YELLOW   APPearance CLOUDY (*) CLEAR   Specific Gravity, Urine 1.034 (*) 1.005 - 1.030   pH 5.5  5.0 - 8.0   Glucose, UA NEGATIVE  NEGATIVE mg/dL   Hgb urine dipstick NEGATIVE  NEGATIVE   Bilirubin Urine SMALL (*) NEGATIVE   Ketones, ur NEGATIVE  NEGATIVE mg/dL   Protein, ur NEGATIVE  NEGATIVE mg/dL   Urobilinogen, UA 1.0  0.0 - 1.0 mg/dL   Nitrite NEGATIVE  NEGATIVE   Leukocytes, UA NEGATIVE   NEGATIVE     MDM   DDX: UTI, muscle strain, radiculopathy  Patient with clinical diagnosis of right low back strain. We have ruled out UTI. Sx are not consistent with radiculopathy. No signs or sx of infectious process. Normal neuro exam. I have reviewed some stretching excersizes for the low back with the patient and suggested yoga therapy. We will prescribe Tramadol, NSAID and Flexeril. Patient will f/u with her PCP.    Elyn Peers, MD 06/01/13 [phone]

## 2013-06-01 NOTE — Discharge Instructions (Signed)
Back Exercises °Back exercises help treat and prevent back injuries. The goal of back exercises is to increase the strength of your abdominal and back muscles and the flexibility of your back. These exercises should be started when you no longer have back pain. Back exercises include: °· Pelvic Tilt. Lie on your back with your knees bent. Tilt your pelvis until the lower part of your back is against the floor. Hold this position 5 to 10 sec and repeat 5 to 10 times. °· Knee to Chest. Pull first 1 knee up against your chest and hold for 20 to 30 seconds, repeat this with the other knee, and then both knees. This may be done with the other leg straight or bent, whichever feels better. °· Sit-Ups or Curl-Ups. Bend your knees 90 degrees. Start with tilting your pelvis, and do a partial, slow sit-up, lifting your trunk only 30 to 45 degrees off the floor. Take at least 2 to 3 seconds for each sit-up. Do not do sit-ups with your knees out straight. If partial sit-ups are difficult, simply do the above but with only tightening your abdominal muscles and holding it as directed. °· Hip-Lift. Lie on your back with your knees flexed 90 degrees. Push down with your feet and shoulders as you raise your hips a couple inches off the floor; hold for 10 seconds, repeat 5 to 10 times. °· Back arches. Lie on your stomach, propping yourself up on bent elbows. Slowly press on your hands, causing an arch in your low back. Repeat 3 to 5 times. Any initial stiffness and discomfort should lessen with repetition over time. °· Shoulder-Lifts. Lie face down with arms beside your body. Keep hips and torso pressed to floor as you slowly lift your head and shoulders off the floor. °Do not overdo your exercises, especially in the beginning. Exercises may cause you some mild back discomfort which lasts for a few minutes; however, if the pain is more severe, or lasts for more than 15 minutes, do not continue exercises until you see your caregiver.  Improvement with exercise therapy for back problems is slow.  °See your caregivers for assistance with developing a proper back exercise program. °Document Released: 02/04/2004 Document Revised: 03/21/2011 Document Reviewed: 10/28/2010 °Exit[employer]® Patient Information ©2014 [employer]. ° °Back Pain, Adult °Low back pain is very common. About 1 in 5 people have back pain. The cause of low back pain is rarely dangerous. The pain often gets better over time. About half of people with a sudden onset of back pain feel better in just 2 weeks. About 8 in 10 people feel better by 6 weeks.  °CAUSES °Some common causes of back pain include: °· Strain of the muscles or ligaments supporting the spine. °· Wear and tear (degeneration) of the spinal discs. °· Arthritis. °· Direct injury to the back. °DIAGNOSIS °Most of the time, the direct cause of low back pain is not known. However, back pain can be treated effectively even when the exact cause of the pain is unknown. Answering your caregiver's questions about your overall health and symptoms is one of the most accurate ways to make sure the cause of your pain is not dangerous. If your caregiver needs more information, he or she may order lab work or imaging tests (X-rays or MRIs). However, even if imaging tests show changes in your back, this usually does not require surgery. °HOME CARE INSTRUCTIONS °For many people, back pain returns. Since low back pain is rarely dangerous, it is often a condition that people   can learn to manage on their own.  °· Remain active. It is stressful on the back to sit or stand in one place. Do not sit, drive, or stand in one place for more than 30 minutes at a time. Take short walks on level surfaces as soon as pain allows. Try to increase the length of time you walk each day. °· Do not stay in bed. Resting more than 1 or 2 days can delay your recovery. °· Do not avoid exercise or work. Your body is made to move. It is not dangerous to be active,  even though your back may hurt. Your back will likely heal faster if you return to being active before your pain is gone. °· Pay attention to your body when you  bend and lift. Many people have less discomfort when lifting if they bend their knees, keep the load close to their bodies, and avoid twisting. Often, the most comfortable positions are those that put less stress on your recovering back. °· Find a comfortable position to sleep. Use a firm mattress and lie on your side with your knees slightly bent. If you lie on your back, put a pillow under your knees. °· Only take over-the-counter or prescription medicines as directed by your caregiver. Over-the-counter medicines to reduce pain and inflammation are often the most helpful. Your caregiver may prescribe muscle relaxant drugs. These medicines help dull your pain so you can more quickly return to your normal activities and healthy exercise. °· Put ice on the injured area. °· Put ice in a plastic bag. °· Place a towel between your skin and the bag. °· Leave the ice on for 15-20 minutes, 03-04 times a day for the first 2 to 3 days. After that, ice and heat may be alternated to reduce pain and spasms. °· Ask your caregiver about trying back exercises and gentle massage. This may be of some benefit. °· Avoid feeling anxious or stressed. Stress increases muscle tension and can worsen back pain. It is important to recognize when you are anxious or stressed and learn ways to manage it. Exercise is a great option. °SEEK MEDICAL CARE IF: °· You have pain that is not relieved with rest or medicine. °· You have pain that does not improve in 1 week. °· You have new symptoms. °· You are generally not feeling well. °SEEK IMMEDIATE MEDICAL CARE IF:  °· You have pain that radiates from your back into your legs. °· You develop new bowel or bladder control problems. °· You have unusual weakness or numbness in your arms or legs. °· You develop nausea or vomiting. °· You develop  abdominal pain. °· You feel faint. °Document Released: 12/27/2004 Document Revised: 06/28/2011 Document Reviewed: 05/17/2010 °ExitCare® Patient Information ©2014 [employer]. ° °

## 2013-06-01 NOTE — ED Notes (Signed)
Ambulated to restroom for UA.  Standby assist.

## 2013-10-14 ENCOUNTER — Ambulatory Visit: Payer: Medicare Other | Admitting: Internal Medicine

## 2013-10-21 ENCOUNTER — Other Ambulatory Visit: Payer: Self-pay

## 2013-10-21 ENCOUNTER — Encounter: Payer: Self-pay | Admitting: Internal Medicine

## 2013-10-21 ENCOUNTER — Ambulatory Visit: Payer: Commercial Managed Care - HMO | Attending: Internal Medicine | Admitting: Internal Medicine

## 2013-10-21 VITALS — BP 117/81 | HR 79 | Temp 98.2°F | Resp 17 | Ht 64.0 in | Wt 194.0 lb

## 2013-10-21 DIAGNOSIS — R7309 Other abnormal glucose: Secondary | ICD-10-CM | POA: Diagnosis not present

## 2013-10-21 DIAGNOSIS — R202 Paresthesia of skin: Secondary | ICD-10-CM | POA: Diagnosis not present

## 2013-10-21 DIAGNOSIS — Z87891 Personal history of nicotine dependence: Secondary | ICD-10-CM | POA: Insufficient documentation

## 2013-10-21 DIAGNOSIS — M545 Low back pain: Secondary | ICD-10-CM | POA: Insufficient documentation

## 2013-10-21 DIAGNOSIS — K219 Gastro-esophageal reflux disease without esophagitis: Secondary | ICD-10-CM | POA: Insufficient documentation

## 2013-10-21 DIAGNOSIS — Z881 Allergy status to other antibiotic agents status: Secondary | ICD-10-CM | POA: Insufficient documentation

## 2013-10-21 DIAGNOSIS — Z1239 Encounter for other screening for malignant neoplasm of breast: Secondary | ICD-10-CM | POA: Insufficient documentation

## 2013-10-21 DIAGNOSIS — R200 Anesthesia of skin: Secondary | ICD-10-CM

## 2013-10-21 DIAGNOSIS — Z79899 Other long term (current) drug therapy: Secondary | ICD-10-CM | POA: Diagnosis not present

## 2013-10-21 DIAGNOSIS — R079 Chest pain, unspecified: Secondary | ICD-10-CM | POA: Diagnosis present

## 2013-10-21 DIAGNOSIS — M5442 Lumbago with sciatica, left side: Secondary | ICD-10-CM | POA: Diagnosis not present

## 2013-10-21 DIAGNOSIS — I10 Essential (primary) hypertension: Secondary | ICD-10-CM | POA: Insufficient documentation

## 2013-10-21 DIAGNOSIS — R7303 Prediabetes: Secondary | ICD-10-CM

## 2013-10-21 DIAGNOSIS — R0602 Shortness of breath: Secondary | ICD-10-CM | POA: Insufficient documentation

## 2013-10-21 DIAGNOSIS — F329 Major depressive disorder, single episode, unspecified: Secondary | ICD-10-CM | POA: Insufficient documentation

## 2013-10-21 DIAGNOSIS — Z1231 Encounter for screening mammogram for malignant neoplasm of breast: Secondary | ICD-10-CM | POA: Insufficient documentation

## 2013-10-21 DIAGNOSIS — Z22322 Carrier or suspected carrier of Methicillin resistant Staphylococcus aureus: Secondary | ICD-10-CM | POA: Diagnosis not present

## 2013-10-21 MED ORDER — TRAMADOL HCL 50 MG PO TABS: 50.0000 mg | ORAL_TABLET | Freq: Four times a day (QID) | ORAL | Status: DC | PRN

## 2013-10-21 MED ORDER — ESOMEPRAZOLE MAGNESIUM 40 MG PO CPDR: 40.0000 mg | DELAYED_RELEASE_CAPSULE | Freq: Every day | ORAL | Status: DC

## 2013-10-21 MED ORDER — CYCLOBENZAPRINE HCL 10 MG PO TABS: 10.0000 mg | ORAL_TABLET | Freq: Two times a day (BID) | ORAL | Status: DC | PRN

## 2013-10-21 MED ORDER — GABAPENTIN 100 MG PO CAPS: 100.0000 mg | ORAL_CAPSULE | Freq: Three times a day (TID) | ORAL | Status: DC

## 2013-10-21 NOTE — Patient Instructions (Signed)
Back Pain, Adult Low back pain is very common. About 1 in 5 people have back pain.The cause of low back pain is rarely dangerous. The pain often gets better over time.About half of people with a sudden onset of back pain feel better in just 2 weeks. About 8 in 10 people feel better by 6 weeks.  CAUSES Some common causes of back pain include:  Strain of the muscles or ligaments supporting the spine.  Wear and tear (degeneration) of the spinal discs.  Arthritis.  Direct injury to the back. DIAGNOSIS Most of the time, the direct cause of low back pain is not known.However, back pain can be treated effectively even when the exact cause of the pain is unknown.Answering your caregiver's questions about your overall health and symptoms is one of the most accurate ways to make sure the cause of your pain is not dangerous. If your caregiver needs more information, he or she may order lab work or imaging tests (X-rays or MRIs).However, even if imaging tests show changes in your back, this usually does not require surgery. HOME CARE INSTRUCTIONS For many people, back pain returns.Since low back pain is rarely dangerous, it is often a condition that people can learn to manageon their own.   Remain active. It is stressful on the back to sit or stand in one place. Do not sit, drive, or stand in one place for more than 30 minutes at a time. Take short walks on level surfaces as soon as pain allows.Try to increase the length of time you walk each day.  Do not stay in bed.Resting more than 1 or 2 days can delay your recovery.  Do not avoid exercise or work.Your body is made to move.It is not dangerous to be active, even though your back may hurt.Your back will likely heal faster if you return to being active before your pain is gone.  Pay attention to your body when you bend and lift. Many people have less discomfortwhen lifting if they bend their knees, keep the load close to their bodies,and  avoid twisting. Often, the most comfortable positions are those that put less stress on your recovering back.  Find a comfortable position to sleep. Use a firm mattress and lie on your side with your knees slightly bent. If you lie on your back, put a pillow under your knees.  Only take over-the-counter or prescription medicines as directed by your caregiver. Over-the-counter medicines to reduce pain and inflammation are often the most helpful.Your caregiver may prescribe muscle relaxant drugs.These medicines help dull your pain so you can more quickly return to your normal activities and healthy exercise.  Put ice on the injured area.  Put ice in a plastic bag.  Place a towel between your skin and the bag.  Leave the ice on for 15-20 minutes, 03-04 times a day for the first 2 to 3 days. After that, ice and heat may be alternated to reduce pain and spasms.  Ask your caregiver about trying back exercises and gentle massage. This may be of some benefit.  Avoid feeling anxious or stressed.Stress increases muscle tension and can worsen back pain.It is important to recognize when you are anxious or stressed and learn ways to manage it.Exercise is a great option. SEEK MEDICAL CARE IF:  You have pain that is not relieved with rest or medicine.  You have pain that does not improve in 1 week.  You have new symptoms.  You are generally not feeling well. SEEK   IMMEDIATE MEDICAL CARE IF:   You have pain that radiates from your back into your legs.  You develop new bowel or bladder control problems.  You have unusual weakness or numbness in your arms or legs.  You develop nausea or vomiting.  You develop abdominal pain.  You feel faint. Document Released: 12/27/2004 Document Revised: 06/28/2011 Document Reviewed: 04/30/2013 ExitCare Patient Information 2015 [employer]. This information is not intended to replace advice given to you by your health care provider. Make sure you  discuss any questions you have with your health care provider.  

## 2013-10-21 NOTE — Progress Notes (Signed)
Patient ID: Crystal Stafford, female   DOB: 1960/07/28, 53 y.o.   MRN: [id number]   Crystal Stafford, is a 53 y.o. female  [id number]  [id number]  DOB - 01/21/1960  Chief Complaint  Patient presents with  . Follow-up        Subjective:   Crystal Stafford is a 53 y.o. female here today with complaints of chest pain, SOB, and lower back pain.  The back pain started after Ms. Laurich reports sleeping on her stomach.  She was unable to get out of bed the following morning and felt "like my spine was twisted."  She denies injury, trauma, or heavy lifting.  The pain is worsened by standing and relieved by sitting.  She has tried OTC pain medications without relief.  Heat does improve the pain intermittently.    Ms. Hoselton reports GERD for which she is managed by GI.  She recently had an episode of vomiting after which she developed this left sided chest pain.  The pain is intermittent in nature and she describes it as a "hurting."  She denies arm pain, upper back pain, jaw pain, diaphoresis.  She does endorse SOB.  This was been going on for several weeks.  She complains of SOB with quick activity, such as house work.  She is able to walk [place]" without stopping because of difficulty breathing.  Ms. Royse states "I think it's because I need to lose some weight."  She also complains of constant fatigue.  She feels unrested upon awakening.  Per her significant other, she does snore.   She stopped smoking 1 year ago.  No reported alcohol consumption.  ALLERGIES: Allergies  Allergen Reactions  . Doxycycline Swelling and Rash    PAST MEDICAL HISTORY: Past Medical History  Diagnosis Date  . Depression   . MRSA (methicillin resistant Staphylococcus aureus)   . Hypertension   . GERD (gastroesophageal reflux disease)     MEDICATIONS AT HOME: Prior to Admission medications   Medication Sig Start Date End Date Taking? Authorizing Provider  EPINEPHrine (EPIPEN IJ) Inject 1 each as  directed once as needed (for allergic reaction).   Yes Historical Provider, MD  ibuprofen (ADVIL,MOTRIN) 200 MG tablet Take 600 mg by mouth every 6 (six) hours as needed for moderate pain.   Yes Historical Provider, MD  ibuprofen (ADVIL,MOTRIN) 600 MG tablet Take 1 tablet (600 mg total) by mouth every 6 (six) hours as needed. 06/01/13  Yes Elyn Peers, MD  sertraline (ZOLOFT) 50 MG tablet Take 100 mg by mouth daily.    Yes Historical Provider, MD  traMADol (ULTRAM) 50 MG tablet Take 1 tablet (50 mg total) by mouth every 6 (six) hours as needed. 06/01/13  Yes Elyn Peers, MD  cyclobenzaprine (FLEXERIL) 10 MG tablet Take 1 tablet (10 mg total) by mouth 2 (two) times daily as needed for muscle spasms. 06/01/13   Elyn Peers, MD  gabapentin (NEURONTIN) 100 MG capsule Take 1 capsule (100 mg total) by mouth 3 (three) times daily. 05/20/13   Tresa Garter, MD  naproxen sodium (ANAPROX) 220 MG tablet Take 440 mg by mouth daily as needed (pain).    Historical Provider, MD   Review of Systems  Constitutional: Positive for malaise/fatigue.  HENT: Negative.   Eyes: Positive for blurred vision.  Respiratory: Positive for shortness of breath. Negative for cough, hemoptysis, sputum production and wheezing.   Cardiovascular: Positive for chest pain. Negative for palpitations, orthopnea, claudication, leg swelling and PND.  Gastrointestinal: Positive  for heartburn and vomiting. Negative for nausea, abdominal pain, diarrhea, constipation, blood in stool and melena.  Genitourinary: Negative.   Musculoskeletal: Positive for back pain and myalgias. Negative for falls, joint pain and neck pain.  Neurological: Negative.      Objective:   Filed Vitals:   10/21/13 1101  BP: 117/81  Pulse: 79  Temp: 98.2 F (36.8 C)  TempSrc: Oral  Resp: 17  Height: 5\' 4"  (1.626 m)  Weight: 194 lb (87.998 kg)  SpO2: 99%    Exam General appearance : Awake, alert, not in any distress. Speech Clear. Not toxic  looking HEENT: Atraumatic and Normocephalic, pupils equally reactive to light and accomodation Neck: supple, no JVD. No cervical lymphadenopathy.  Chest:Good air entry bilaterally, no added sounds. Pain at 4th intercostal space with palpation.   CVS: S1 S2 regular, no murmurs.  Abdomen: Bowel sounds present, Non tender and not distended with no gaurding, rigidity or rebound. Extremities: B/L Lower Ext shows no edema, both legs are warm to touch. Negative straight leg raise Spinal: Mild tenderness to palpation in lower lumbar region. No deformities noted. Neurology: Awake alert, and oriented X 3, CN II-XII intact, Non focal Skin:No Rash Wounds:N/A  Data Review Lab Results  Component Value Date   HGBA1C 5.6 05/20/2013   HGBA1C 6.1* 02/08/2012     Assessment & Plan   1. Lower back pain  -Flexeril 10 mg BID as needed for back pain -Tramadol 50 mg BID as needed for back pain -Avoid OTC NSAIDS with GERD  2. GERD  -Nexium 20 mg daily  3. Preventative screening  -mammogram -opthalmology referral   The patient was given clear instructions to go to ER or return to medical center if symptoms don't improve, worsen or new problems develop. The patient verbalized understanding. The patient was told to call to get lab results if they haven't heard anything in the next week.   This note has been created with Surveyor, quantity. Any transcriptional errors are unintentional.   Cayman Kielbasa, FNP-student  Evaluation and management procedures were performed by the Advanced Practitioner under my supervision and collaboration. I have reviewed the Advanced Practitioner's note and chart, and I agree with the management and plan.   Angelica Chessman, MD, [place], [place], [place] and [place], [place]   10/21/2013, 11:44 AM

## 2013-10-21 NOTE — Progress Notes (Signed)
Pt is here c/o lower back pain and chest pain with SOB

## 2013-11-19 ENCOUNTER — Ambulatory Visit: Payer: Medicare Other | Admitting: Internal Medicine

## 2014-05-08 ENCOUNTER — Ambulatory Visit: Payer: Commercial Managed Care - HMO | Attending: Internal Medicine | Admitting: Internal Medicine

## 2014-05-08 ENCOUNTER — Encounter: Payer: Self-pay | Admitting: Internal Medicine

## 2014-05-08 VITALS — BP 132/87 | HR 80 | Temp 98.4°F | Wt 183.0 lb

## 2014-05-08 DIAGNOSIS — R7309 Other abnormal glucose: Secondary | ICD-10-CM | POA: Diagnosis not present

## 2014-05-08 DIAGNOSIS — R202 Paresthesia of skin: Secondary | ICD-10-CM

## 2014-05-08 DIAGNOSIS — R7303 Prediabetes: Secondary | ICD-10-CM

## 2014-05-08 DIAGNOSIS — K219 Gastro-esophageal reflux disease without esophagitis: Secondary | ICD-10-CM

## 2014-05-08 DIAGNOSIS — R200 Anesthesia of skin: Secondary | ICD-10-CM

## 2014-05-08 DIAGNOSIS — M5442 Lumbago with sciatica, left side: Secondary | ICD-10-CM

## 2014-05-08 LAB — POCT GLYCOSYLATED HEMOGLOBIN (HGB A1C): Hemoglobin A1C: 6.2

## 2014-05-08 LAB — GLUCOSE, POCT (MANUAL RESULT ENTRY): POC GLUCOSE: 96 mg/dL (ref 70–99)

## 2014-05-08 MED ORDER — SUCRALFATE 1 GM/10ML PO SUSP: 1.0000 g | Freq: Three times a day (TID) | ORAL | Status: DC

## 2014-05-08 MED ORDER — GABAPENTIN 100 MG PO CAPS: 100.0000 mg | ORAL_CAPSULE | Freq: Three times a day (TID) | ORAL | Status: DC

## 2014-05-08 MED ORDER — CYCLOBENZAPRINE HCL 10 MG PO TABS: 10.0000 mg | ORAL_TABLET | Freq: Two times a day (BID) | ORAL | Status: DC | PRN

## 2014-05-08 MED ORDER — TRAMADOL HCL 50 MG PO TABS: 50.0000 mg | ORAL_TABLET | Freq: Four times a day (QID) | ORAL | Status: DC | PRN

## 2014-05-08 MED ORDER — ESOMEPRAZOLE MAGNESIUM 40 MG PO CPDR: 40.0000 mg | DELAYED_RELEASE_CAPSULE | Freq: Every day | ORAL | Status: DC

## 2014-05-08 NOTE — Progress Notes (Signed)
Patient here to follow up on hypertension and diabetes.  She does not check her glucose.  She does not have a machine.  She says "if it has to do with needles I'm not going to check anyway."

## 2014-05-08 NOTE — Progress Notes (Addendum)
Subjective:     Patient ID: Clayborn Heron, female   DOB: 10/05/1960, 54 y.o.   MRN: [id number]  HPI  Ms. Ventresca is a 54 yo female with a history of prediabetes, GERD, peptic ulcer, and tobacco abuse here today for a follow up visit. Patient's has 2 main concerns today: 1) knot of pain in the epigastric region and 2) pain in her lower back that radiates to numbness, tingling sensation in left leg.   Patient describes the knot of pain as a sharp pain localized to epigastric region and does not radiate. Pain wakes her up at night around 3 am and increases in severity if she presses the area of pain. Laying flat and resting seems to provide moderate relief of pain, but she does not note any other relieving factors. Pain seems to be constantly there even at rest and does not seem to get better or worse after meals. She was prescribed Nexium for history of GERD and a peptic ulcer, but notes she has not been taking the medication. She recently had a colonoscopy that was normal and denies any blood in stool. Denies changes in appetite, nausea, vomiting, diarrhea, constipation. Denies chest pain, SOB, dyspnea. Denies fever, chills, fatigue, weight loss.   Patient also complains of 2-3 month history of pain in the middle of her lower back that seems to radiate down to her left leg. Reports occasional numbness and tingling in left leg. She's been walking with a limp because of the pain. Pain seemed to gradually worsen over the last few months but has been about the same level of severity over the last few weeks. Pain is worsened with movement such as walking and relieved with rest. Denies history of trauma or injury.   Patient notes that she has not been eating as healthy and drinks lots of soda and sweet things and does not exercise. She resumed smoking due to stress from recent death of a family member and currently smokes 1/2 pack cigarettes a day.    Active Ambulatory Problems    Diagnosis Date Noted  .  HYPERLIPIDEMIA 06/02/2009  . TOBACCO ABUSE 11/24/2006  . DEPRESSION 05/19/2009  . ASTHMA 11/10/2005  . SEBACEOUS CYST, SCALP 08/21/2009  . FATIGUE 05/19/2009  . HEADACHE 06/02/2009  . 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 PELVIC PAIN, RIGHT 11/24/2006  . INSOMNIA 02/04/2010  . Hypertension 04/06/2012  . Prediabetes 04/06/2012  . Peptic ulcer 04/06/2012  . Physical exam, routine 10/25/2012  . Allergy 10/25/2012  . Shoulder pain, right 02/18/2013  . Morbid obesity 02/18/2013  . Numbness and tingling 05/20/2013  . Essential hypertension 10/21/2013  . Midline low back pain with left-sided sciatica 10/21/2013  . Breast cancer screening 10/21/2013  . Gastroesophageal reflux disease without esophagitis 10/21/2013  . Encounter for screening mammogram for malignant neoplasm of breast 10/21/2013   Resolved Ambulatory Problems    Diagnosis Date Noted  . Scabies 05/17/2007  . HYPERKALEMIA 05/02/2008  . BACK PAIN, ACUTE 04/29/2008  . CHEST WALL PAIN, ANTERIOR 06/02/2009  . Carbuncle and furuncle of trunk 02/04/2010   Past Medical History  Diagnosis Date  . Depression   . MRSA (methicillin resistant Staphylococcus aureus)   . GERD (gastroesophageal reflux disease)    Outpatient Encounter Prescriptions as of 05/08/2014  Medication Sig  . busPIRone (BUSPAR) 10 MG tablet Take 10 mg by mouth 3 (three) times daily.  . cyclobenzaprine (FLEXERIL) 10 MG tablet Take 1 tablet (10 mg total) by mouth 2 (two) times  daily as needed for muscle spasms.  Marland Kitchen EPINEPHrine (EPIPEN IJ) Inject 1 each as directed once as needed (for allergic reaction).  Marland Kitchen esomeprazole (NEXIUM) 40 MG capsule Take 1 capsule (40 mg total) by mouth daily.  Marland Kitchen gabapentin (NEURONTIN) 100 MG capsule Take 1 capsule (100 mg total) by mouth 3 (three) times daily.  Marland Kitchen HYDROXYZINE PAMOATE PO Take 25 mg by mouth 2 (two) times daily.  . naproxen sodium (ANAPROX) 220 MG tablet Take 440 mg by mouth daily as needed (pain).  .  traMADol (ULTRAM) 50 MG tablet Take 1 tablet (50 mg total) by mouth every 6 (six) hours as needed.  . [DISCONTINUED] cyclobenzaprine (FLEXERIL) 10 MG tablet Take 1 tablet (10 mg total) by mouth 2 (two) times daily as needed for muscle spasms.  . [DISCONTINUED] esomeprazole (NEXIUM) 40 MG capsule Take 1 capsule (40 mg total) by mouth daily.  . [DISCONTINUED] gabapentin (NEURONTIN) 100 MG capsule Take 1 capsule (100 mg total) by mouth 3 (three) times daily.  . [DISCONTINUED] sertraline (ZOLOFT) 50 MG tablet Take 100 mg by mouth daily.   . [DISCONTINUED] traMADol (ULTRAM) 50 MG tablet Take 1 tablet (50 mg total) by mouth every 6 (six) hours as needed.  . sucralfate (CARAFATE) 1 GM/10ML suspension Take 10 mLs (1 g total) by mouth 4 (four) times daily -  with meals and at bedtime.    Review of Systems  Constitutional: Negative for activity change.  Respiratory: Negative for cough, chest tightness and shortness of breath.   Cardiovascular: Negative for chest pain and leg swelling.  Gastrointestinal: Positive for abdominal pain. Negative for nausea, vomiting, diarrhea, constipation, blood in stool and abdominal distention.  Genitourinary: Negative for dysuria and hematuria.  Musculoskeletal: Positive for back pain.    Objective: Filed Vitals:   05/08/14 0934  BP: 132/87  Pulse: 80  Temp: 98.4 F (36.9 C)      Physical Exam  Constitutional:  Well-appearing, obese pleasant AA female alert, oriented, and in no acute distress.  HENT:  Head: Normocephalic.  Nose: Nose normal.  Mouth/Throat: Oropharynx is clear and moist.  Eyes: Conjunctivae and EOM are normal. Pupils are equal, round, and reactive to light. No scleral icterus.  Neck: Normal range of motion. Neck supple. No thyromegaly present.  Cardiovascular: Normal rate, regular rhythm, normal heart sounds and intact distal pulses.  Exam reveals no gallop and no friction rub.   No murmur heard. Pulmonary/Chest: Effort normal and breath  sounds normal.  Abdominal: Soft. Bowel sounds are normal. She exhibits no distension.  Tenderness to palpation in epigastric region. No tenderness in lower abdomen. No hepatomegaly or splenomegaly.   Musculoskeletal: Normal range of motion. She exhibits no edema.  Lymphadenopathy:    She has no cervical adenopathy.  Neurological: She displays normal reflexes. She exhibits normal muscle tone. Coordination normal.       Assessment & Plan  Ms. Radovich is a 54 yo female with a history of prediabetes, GERD, peptic ulcer, and tobacco abuse here today for a follow up visit and 2 main concerns: 1) knot of pain in the epigastric region and 2) pain in her lower back that radiates to numbness, tingling sensation in left leg.  Prediabetes Patient's A1C today is 6.2 and increased from A1C of 5.6 at last visit on 05/20/13. Her CBG today is 96. Given patient is now in prediabetes range, counseled patient on importance of low sugar diet, daily exercise to prevent development of T2DM. Educated patient about diabetes and the future need  for starting diabetes medication if A1C is not well-controlled.    Aim for 2-3 Carb Choices per meal (30-45 grams) +/- 1 either way  Aim for 0-15 Carbs per snack if hungry  Include protein in moderation with your meals and snacks  Consider reading food labels for Total Carbohydrate and Fat Grams of foods  Consider checking BG at alternate times per day  Continue taking medication as directed Fruit Punch - find one with no sugar  Measure and decrease portions of carbohydrate foods  Make your plate and don't go back for seconds   Midline low back pain with left-sided sciatica, numbness & tingling Given pain starts middle of lower back and radiates unilaterally to left leg, pain had gradual onset with no history of recent injury, this is most consistent with left-sided sciatica. Will start patient on following meds for control of back pain and neuropathic pain:  - Tramadol  (Ultram) 50 MG tablet; Take 1 tablet (50 mg total) by mouth every 6 h as needed.  - Cyclobenzaprine (Flexeril) 10 MG tablet; Take 1 tablet (10 mg total) by mouth 2 times as needed for muscle spasms. - Gabapentin (neurontin) 100 MG capsule; Take 1 capsule (100 mg total) by mouth 3 times daily.  GERD Given patient's history of GERD and peptic ulcer, description of  abdominal pain as a "knot of pain" in epigastric region that has been persistent and causes her to wake up in middle of night, and lack of medication adherence to PPI therapy, it is possible that patient may have recurrence of peptic ulcer. Instructed patient to discontinue use of any NSAIDs and avoid alcohol, spicy foods, and work on smoking cessation. Will order H pylori breath test to determine if triple therapy with antibiotics is needed. Will empirically treat for PUD with sucralfate (Carafate) and esomeprazole (nexium) at following doses:  - Esomeprazole (NEXIUM) 40 MG capsule; Take 1 capsule (40 mg total) by mouth daily. - Sucralfate (CARAFATE) 1 GM/10ML suspension; Take 10 mLs (1 g total) by mouth 4 (four) times daily -  with meals and at bedtime   Evaluation and management procedures were performed by me with Medical Student in attendance, note written by medical student under my supervision and collaboration. I have reviewed the note and I agree with the management and plan.   Angelica Chessman, MD, [place], [place], [place] and [place], [place]   05/08/2014, 3:47 PM

## 2014-05-08 NOTE — Patient Instructions (Signed)
Gastroesophageal Reflux Disease, Adult Gastroesophageal reflux disease (GERD) happens when acid from your stomach flows up into the esophagus. When acid comes in contact with the esophagus, the acid causes soreness (inflammation) in the esophagus. Over time, GERD may create small holes (ulcers) in the lining of the esophagus. CAUSES   Increased body weight. This puts pressure on the stomach, making acid rise from the stomach into the esophagus.  Smoking. This increases acid production in the stomach.  Drinking alcohol. This causes decreased pressure in the lower esophageal sphincter (valve or ring of muscle between the esophagus and stomach), allowing acid from the stomach into the esophagus.  Late evening meals and a full stomach. This increases pressure and acid production in the stomach.  A malformed lower esophageal sphincter. Sometimes, no cause is found. SYMPTOMS   Burning pain in the lower part of the mid-chest behind the breastbone and in the mid-stomach area. This may occur twice a week or more often.  Trouble swallowing.  Sore throat.  Dry cough.  Asthma-like symptoms including chest tightness, shortness of breath, or wheezing. DIAGNOSIS  Your caregiver may be able to diagnose GERD based on your symptoms. In some cases, X-rays and other tests may be done to check for complications or to check the condition of your stomach and esophagus. TREATMENT  Your caregiver may recommend over-the-counter or prescription medicines to help decrease acid production. Ask your caregiver before starting or adding any new medicines.  HOME CARE INSTRUCTIONS   Change the factors that you can control. Ask your caregiver for guidance concerning weight loss, quitting smoking, and alcohol consumption.  Avoid foods and drinks that make your symptoms worse, such as:  Caffeine or alcoholic drinks.  Chocolate.  Peppermint or mint flavorings.  Garlic and onions.  Spicy foods.  Citrus fruits,  such as oranges, lemons, or limes.  Tomato-based foods such as sauce, chili, salsa, and pizza.  Fried and fatty foods.  Avoid lying down for the 3 hours prior to your bedtime or prior to taking a nap.  Eat small, frequent meals instead of large meals.  Wear loose-fitting clothing. Do not wear anything tight around your waist that causes pressure on your stomach.  Raise the head of your bed 6 to 8 inches with wood blocks to help you sleep. Extra pillows will not help.  Only take over-the-counter or prescription medicines for pain, discomfort, or fever as directed by your caregiver.  Do not take aspirin, ibuprofen, or other nonsteroidal anti-inflammatory drugs (NSAIDs). SEEK IMMEDIATE MEDICAL CARE IF:   You have pain in your arms, neck, jaw, teeth, or back.  Your pain increases or changes in intensity or duration.  You develop nausea, vomiting, or sweating (diaphoresis).  You develop shortness of breath, or you faint.  Your vomit is green, yellow, black, or looks like coffee grounds or blood.  Your stool is red, bloody, or black. These symptoms could be signs of other problems, such as heart disease, gastric bleeding, or esophageal bleeding. MAKE SURE YOU:   Understand these instructions.  Will watch your condition.  Will get help right away if you are not doing well or get worse. Document Released: 10/06/2004 Document Revised: 03/21/2011 Document Reviewed: 07/16/2010 [facility] Patient Information 2015 [place], [place]. This information is not intended to replace advice given to you by your health care provider. Make sure you discuss any questions you have with your health care provider. Peptic Ulcer A peptic ulcer is a sore in the lining of your esophagus (esophageal ulcer),  stomach (gastric ulcer), or in the first part of your small intestine (duodenal ulcer). The ulcer causes erosion into the deeper tissue. CAUSES  Normally, the lining of the stomach and the small intestine  protects itself from the acid that digests food. The protective lining can be damaged by:  An infection caused by a bacterium called Helicobacter pylori (H. pylori).  Regular use of nonsteroidal anti-inflammatory drugs (NSAIDs), such as ibuprofen or aspirin.  Smoking tobacco. Other risk factors include being older than 59, drinking alcohol excessively, and having a family history of ulcer disease.  SYMPTOMS   Burning pain or gnawing in the area between the chest and the belly button.  Heartburn.  Nausea and vomiting.  Bloating. The pain can be worse on an empty stomach and at night. If the ulcer results in bleeding, it can cause:  Black, tarry stools.  Vomiting of bright red blood.  Vomiting of coffee-ground-looking materials. DIAGNOSIS  A diagnosis is usually made based upon your history and an exam. Other tests and procedures may be performed to find the cause of the ulcer. Finding a cause will help determine the best treatment. Tests and procedures may include:  Blood tests, stool tests, or breath tests to check for the bacterium H. pylori.  An upper gastrointestinal (GI) series of the esophagus, stomach, and small intestine.  An endoscopy to examine the esophagus, stomach, and small intestine.  A biopsy. TREATMENT  Treatment may include:  Eliminating the cause of the ulcer, such as smoking, NSAIDs, or alcohol.  Medicines to reduce the amount of acid in your digestive tract.  Antibiotic medicines if the ulcer is caused by the H. pylori bacterium.  An upper endoscopy to treat a bleeding ulcer.  Surgery if the bleeding is severe or if the ulcer created a hole somewhere in the digestive system. HOME CARE INSTRUCTIONS   Avoid tobacco, alcohol, and caffeine. Smoking can increase the acid in the stomach, and continued smoking will impair the healing of ulcers.  Avoid foods and drinks that seem to cause discomfort or aggravate your ulcer.  Only take medicines as  directed by your caregiver. Do not substitute over-the-counter medicines for prescription medicines without talking to your caregiver.  Keep any follow-up appointments and tests as directed. SEEK MEDICAL CARE IF:   Your do not improve within 7 days of starting treatment.  You have ongoing indigestion or heartburn. SEEK IMMEDIATE MEDICAL CARE IF:   You have sudden, sharp, or persistent abdominal pain.  You have bloody or dark black, tarry stools.  You vomit blood or vomit that looks like coffee grounds.  You become light-headed, weak, or feel faint.  You become sweaty or clammy. MAKE SURE YOU:   Understand these instructions.  Will watch your condition.  Will get help right away if you are not doing well or get worse. Document Released: 12/25/1999 Document Revised: 05/13/2013 Document Reviewed: 07/27/2011 [facility] Patient Information 2015 [place], [place]. This information is not intended to replace advice given to you by your health care provider. Make sure you discuss any questions you have with your health care provider.

## 2014-05-09 LAB — H. PYLORI BREATH TEST: H. pylori Breath Test: DETECTED — AB

## 2014-05-21 ENCOUNTER — Other Ambulatory Visit: Payer: Self-pay | Admitting: Internal Medicine

## 2014-05-21 DIAGNOSIS — A048 Other specified bacterial intestinal infections: Secondary | ICD-10-CM

## 2014-05-21 MED ORDER — CLARITHROMYCIN 500 MG PO TABS: 500.0000 mg | ORAL_TABLET | Freq: Two times a day (BID) | ORAL | Status: DC

## 2014-05-21 MED ORDER — AMOXICILLIN 500 MG PO CAPS: 1000.0000 mg | ORAL_CAPSULE | Freq: Two times a day (BID) | ORAL | Status: DC

## 2014-09-04 ENCOUNTER — Encounter: Payer: Self-pay | Admitting: Internal Medicine

## 2014-09-04 ENCOUNTER — Ambulatory Visit: Payer: Commercial Managed Care - HMO | Attending: Internal Medicine | Admitting: Internal Medicine

## 2014-09-04 VITALS — BP 124/86 | HR 102 | Temp 98.8°F | Resp 16 | Ht 64.0 in | Wt 176.0 lb

## 2014-09-04 DIAGNOSIS — F17210 Nicotine dependence, cigarettes, uncomplicated: Secondary | ICD-10-CM | POA: Diagnosis not present

## 2014-09-04 DIAGNOSIS — M5442 Lumbago with sciatica, left side: Secondary | ICD-10-CM | POA: Insufficient documentation

## 2014-09-04 DIAGNOSIS — I10 Essential (primary) hypertension: Secondary | ICD-10-CM | POA: Diagnosis not present

## 2014-09-04 DIAGNOSIS — R1013 Epigastric pain: Secondary | ICD-10-CM | POA: Insufficient documentation

## 2014-09-04 DIAGNOSIS — B9681 Helicobacter pylori [H. pylori] as the cause of diseases classified elsewhere: Secondary | ICD-10-CM | POA: Insufficient documentation

## 2014-09-04 DIAGNOSIS — R7303 Prediabetes: Secondary | ICD-10-CM

## 2014-09-04 DIAGNOSIS — F329 Major depressive disorder, single episode, unspecified: Secondary | ICD-10-CM | POA: Insufficient documentation

## 2014-09-04 DIAGNOSIS — K219 Gastro-esophageal reflux disease without esophagitis: Secondary | ICD-10-CM

## 2014-09-04 DIAGNOSIS — R7309 Other abnormal glucose: Secondary | ICD-10-CM | POA: Diagnosis not present

## 2014-09-04 LAB — CBC WITH DIFFERENTIAL/PLATELET
BASOS ABS: 0 10*3/uL (ref 0.0–0.1)
Basophils Relative: 0 % (ref 0–1)
EOS ABS: 0.1 10*3/uL (ref 0.0–0.7)
Eosinophils Relative: 1 % (ref 0–5)
HEMATOCRIT: 39.2 % (ref 36.0–46.0)
Hemoglobin: 12.9 g/dL (ref 12.0–15.0)
LYMPHS PCT: 43 % (ref 12–46)
Lymphs Abs: 4.5 10*3/uL — ABNORMAL HIGH (ref 0.7–4.0)
MCH: 29.1 pg (ref 26.0–34.0)
MCHC: 32.9 g/dL (ref 30.0–36.0)
MCV: 88.5 fL (ref 78.0–100.0)
MPV: 11.3 fL (ref 8.6–12.4)
Monocytes Absolute: 0.8 10*3/uL (ref 0.1–1.0)
Monocytes Relative: 8 % (ref 3–12)
NEUTROS PCT: 48 % (ref 43–77)
Neutro Abs: 5 10*3/uL (ref 1.7–7.7)
Platelets: 317 10*3/uL (ref 150–400)
RBC: 4.43 MIL/uL (ref 3.87–5.11)
RDW: 16 % — ABNORMAL HIGH (ref 11.5–15.5)
WBC: 10.5 10*3/uL (ref 4.0–10.5)

## 2014-09-04 LAB — POCT GLYCOSYLATED HEMOGLOBIN (HGB A1C): Hemoglobin A1C: 6.1

## 2014-09-04 LAB — TSH: TSH: 1.192 u[IU]/mL (ref 0.350–4.500)

## 2014-09-04 MED ORDER — ACETAMINOPHEN-CODEINE #3 300-30 MG PO TABS: 1.0000 | ORAL_TABLET | ORAL | Status: DC | PRN

## 2014-09-04 NOTE — Progress Notes (Signed)
Patient ID: Crystal Stafford, female   DOB: 03-01-1960, 54 y.o.   MRN: [id number]   Dutchess Crosland, is a 54 y.o. female  WOE:[id number]  OIB:[id number]  DOB - November 17, 1960  Chief Complaint  Patient presents with  . Follow-up    h pylori        Subjective:   Crystal Stafford is a 54 y.o. female here today for a follow up visit. Patient has history of hypertension, GERD, depression, recently diagnosed with H. pylori and treated with triple antibiotics. Patient is here today for follow-up. She still has epigastric pain similar to prior treatment. She continues to smoke heavily, she drinks alcohol and has not complied to telemetry with dietary advice. He claims the pressure is under control, she has no other complaints today. Patient has No headache, No chest pain, No abdominal pain - No Nausea, No new weakness tingling or numbness, No Cough - SOB.  No problems updated.  ALLERGIES: Allergies  Allergen Reactions  . Doxycycline Swelling and Rash    PAST MEDICAL HISTORY: Past Medical History  Diagnosis Date  . Depression   . MRSA (methicillin resistant Staphylococcus aureus)   . Hypertension   . GERD (gastroesophageal reflux disease)     MEDICATIONS AT HOME: Prior to Admission medications   Medication Sig Start Date End Date Taking? Authorizing Provider  acetaminophen-codeine (TYLENOL #3) 300-30 MG per tablet Take 1 tablet by mouth every 4 (four) hours as needed. 09/04/14   Tresa Garter, MD  amoxicillin (AMOXIL) 500 MG capsule Take 2 capsules (1,000 mg total) by mouth 2 (two) times daily. 05/21/14   Tresa Garter, MD  busPIRone (BUSPAR) 10 MG tablet Take 10 mg by mouth 3 (three) times daily.    Historical Provider, MD  clarithromycin (BIAXIN) 500 MG tablet Take 1 tablet (500 mg total) by mouth 2 (two) times daily. 05/21/14   Tresa Garter, MD  cyclobenzaprine (FLEXERIL) 10 MG tablet Take 1 tablet (10 mg total) by mouth 2 (two) times daily as needed for muscle spasms. 05/08/14    Tresa Garter, MD  EPINEPHrine (EPIPEN IJ) Inject 1 each as directed once as needed (for allergic reaction).    Historical Provider, MD  esomeprazole (NEXIUM) 40 MG capsule Take 1 capsule (40 mg total) by mouth daily. 05/08/14   Tresa Garter, MD  gabapentin (NEURONTIN) 100 MG capsule Take 1 capsule (100 mg total) by mouth 3 (three) times daily. 05/08/14   Tresa Garter, MD  HYDROXYZINE PAMOATE PO Take 25 mg by mouth 2 (two) times daily.    Historical Provider, MD  sucralfate (CARAFATE) 1 GM/10ML suspension Take 10 mLs (1 g total) by mouth 4 (four) times daily -  with meals and at bedtime. 05/08/14   Tresa Garter, MD     Objective:   Filed Vitals:   09/04/14 1413  BP: 124/86  Pulse: 102  Temp: 98.8 F (37.1 C)  Resp: 16  Height: 5\' 4"  (1.626 m)  Weight: 176 lb (79.833 kg)  SpO2: 100%    Exam General appearance : Awake, alert, not in any distress. Speech Clear. Not toxic looking HEENT: Atraumatic and Normocephalic, pupils equally reactive to light and accomodation Neck: supple, no JVD. No cervical lymphadenopathy.  Chest:Good air entry bilaterally, no added sounds  CVS: S1 S2 regular, no murmurs.  Abdomen: Bowel sounds present, Non tender and not distended with no gaurding, rigidity or rebound. Extremities: B/L Lower Ext shows no edema, both legs are warm to touch Neurology:  Awake alert, and oriented X 3, CN II-XII intact, Non focal Skin:No Rash  Data Review Lab Results  Component Value Date   HGBA1C 6.20 05/08/2014   HGBA1C 5.6 05/20/2013   HGBA1C 6.1* 02/08/2012     Assessment & Plan   1. Midline low back pain with left-sided sciatica  - acetaminophen-codeine (TYLENOL #3) 300-30 MG per tablet; Take 1 tablet by mouth every 4 (four) hours as needed.  Dispense: 60 tablet; Refill: 0  2. Prediabetes  Aim for 30 minutes of exercise most days. Rethink what you drink. Water is great! Aim for 2-3 Carb Choices per meal (30-45 grams) +/- 1 either way   Aim for 0-15 Carbs per snack if hungry  Include protein in moderation with your meals and snacks  Consider reading food labels for Total Carbohydrate and Fat Grams of foods  Consider checking BG at alternate times per day  Continue taking medication as directed Be mindful about how much sugar you are adding to beverages and other foods. Fruit Punch - find one with no sugar  Measure and decrease portions of carbohydrate foods  Make your plate and don't go back for seconds   3. Gastroesophageal reflux disease without esophagitis  - Continue PPI - Quit smoking cigarette - Avoid spicy food and carbonated drinks   4. Essential hypertension We have discussed target BP range and blood pressure goal. I have advised patient to check BP regularly and to call us back or report to clinic if the numbers are consistently higher than 140/90. We discussed the importance of compliance with medical therapy and DASH diet recommended, consequences of uncontrolled hypertension discussed.  - continue current BP medications  Daily was counseled on the dangers of tobacco use, and was advised to quit. Reviewed strategies to maximize success, including removing cigarettes and smoking materials from environment, stress management and support of family/friends.   Patient have been counseled extensively about nutrition and exercise  Return in about 6 months (around 03/07/2015) for Follow up Pain and comorbidities, Annual Physical, Pap Smear.  The patient was given clear instructions to go to ER or return to medical center if symptoms don't improve, worsen or new problems develop. The patient verbalized understanding. The patient was told to call to get lab results if they haven't heard anything in the next week.   This note has been created with Surveyor, quantity. Any transcriptional errors are unintentional.    Angelica Chessman, MD, [place], [place], [place], [place] and [facility] [place], [place]   09/04/2014, 3:07 PM

## 2014-09-04 NOTE — Progress Notes (Signed)
Patient here for follow up Patient was recently diagnosed with H pylori Patient has since finished her ABT and still having Epigastric pain similar to when she was diagnosed last time

## 2014-09-08 ENCOUNTER — Telehealth: Payer: Self-pay | Admitting: *Deleted

## 2014-09-08 NOTE — Telephone Encounter (Signed)
Verified name and date and gave results of normal blood work

## 2014-09-08 NOTE — Telephone Encounter (Signed)
-----   Message from Tresa Garter, MD sent at 09/05/2014  6:20 PM EDT ----- Please inform patient that her laboratory test results are normal.

## 2014-10-09 ENCOUNTER — Emergency Department (HOSPITAL_COMMUNITY): Payer: Commercial Managed Care - HMO

## 2014-10-09 ENCOUNTER — Encounter (HOSPITAL_COMMUNITY): Payer: Self-pay | Admitting: Emergency Medicine

## 2014-10-09 ENCOUNTER — Inpatient Hospital Stay (HOSPITAL_COMMUNITY)
Admission: EM | Admit: 2014-10-09 | Discharge: 2014-10-13 | DRG: 872 | Disposition: A | Discharge status: Home Health | Payer: Commercial Managed Care - HMO | Attending: Internal Medicine | Admitting: Internal Medicine

## 2014-10-09 DIAGNOSIS — K219 Gastro-esophageal reflux disease without esophagitis: Secondary | ICD-10-CM | POA: Diagnosis present

## 2014-10-09 DIAGNOSIS — Z23 Encounter for immunization: Secondary | ICD-10-CM

## 2014-10-09 DIAGNOSIS — F419 Anxiety disorder, unspecified: Secondary | ICD-10-CM | POA: Diagnosis present

## 2014-10-09 DIAGNOSIS — I10 Essential (primary) hypertension: Secondary | ICD-10-CM | POA: Diagnosis present

## 2014-10-09 DIAGNOSIS — R7302 Impaired glucose tolerance (oral): Secondary | ICD-10-CM | POA: Diagnosis present

## 2014-10-09 DIAGNOSIS — F17200 Nicotine dependence, unspecified, uncomplicated: Secondary | ICD-10-CM | POA: Diagnosis not present

## 2014-10-09 DIAGNOSIS — E872 Acidosis: Secondary | ICD-10-CM | POA: Diagnosis present

## 2014-10-09 DIAGNOSIS — D72829 Elevated white blood cell count, unspecified: Secondary | ICD-10-CM | POA: Diagnosis not present

## 2014-10-09 DIAGNOSIS — R1032 Left lower quadrant pain: Secondary | ICD-10-CM | POA: Diagnosis not present

## 2014-10-09 DIAGNOSIS — F329 Major depressive disorder, single episode, unspecified: Secondary | ICD-10-CM | POA: Diagnosis present

## 2014-10-09 DIAGNOSIS — R0602 Shortness of breath: Secondary | ICD-10-CM | POA: Diagnosis not present

## 2014-10-09 DIAGNOSIS — R109 Unspecified abdominal pain: Secondary | ICD-10-CM | POA: Diagnosis present

## 2014-10-09 DIAGNOSIS — A419 Sepsis, unspecified organism: Secondary | ICD-10-CM

## 2014-10-09 DIAGNOSIS — F17210 Nicotine dependence, cigarettes, uncomplicated: Secondary | ICD-10-CM | POA: Diagnosis present

## 2014-10-09 DIAGNOSIS — J45909 Unspecified asthma, uncomplicated: Secondary | ICD-10-CM | POA: Diagnosis present

## 2014-10-09 DIAGNOSIS — R05 Cough: Secondary | ICD-10-CM | POA: Diagnosis not present

## 2014-10-09 DIAGNOSIS — Z803 Family history of malignant neoplasm of breast: Secondary | ICD-10-CM

## 2014-10-09 DIAGNOSIS — Z800 Family history of malignant neoplasm of digestive organs: Secondary | ICD-10-CM

## 2014-10-09 DIAGNOSIS — R6510 Systemic inflammatory response syndrome (SIRS) of non-infectious origin without acute organ dysfunction: Secondary | ICD-10-CM | POA: Diagnosis present

## 2014-10-09 DIAGNOSIS — M791 Myalgia, unspecified site: Secondary | ICD-10-CM

## 2014-10-09 LAB — HEPATIC FUNCTION PANEL
ALBUMIN: 3.6 g/dL (ref 3.5–5.0)
ALT: 12 U/L — AB (ref 14–54)
AST: 30 U/L (ref 15–41)
Alkaline Phosphatase: 91 U/L (ref 38–126)
BILIRUBIN TOTAL: 0.3 mg/dL (ref 0.3–1.2)
Total Protein: 6.9 g/dL (ref 6.5–8.1)

## 2014-10-09 LAB — URINALYSIS, ROUTINE W REFLEX MICROSCOPIC
Bilirubin Urine: NEGATIVE
Glucose, UA: NEGATIVE mg/dL
Hgb urine dipstick: NEGATIVE
KETONES UR: NEGATIVE mg/dL
NITRITE: NEGATIVE
PROTEIN: NEGATIVE mg/dL
Specific Gravity, Urine: 1.015 (ref 1.005–1.030)
Urobilinogen, UA: 1 mg/dL (ref 0.0–1.0)
pH: 6 (ref 5.0–8.0)

## 2014-10-09 LAB — BASIC METABOLIC PANEL
Anion gap: 10 (ref 5–15)
BUN: 9 mg/dL (ref 6–20)
CO2: 23 mmol/L (ref 22–32)
CREATININE: 1.26 mg/dL — AB (ref 0.44–1.00)
Calcium: 9.5 mg/dL (ref 8.9–10.3)
Chloride: 105 mmol/L (ref 101–111)
GFR calc non Af Amer: 48 mL/min — ABNORMAL LOW (ref >60)
GFR, EST AFRICAN AMERICAN: 56 mL/min — AB (ref >60)
Glucose, Bld: 141 mg/dL — ABNORMAL HIGH (ref 65–99)
Potassium: 4.2 mmol/L (ref 3.5–5.1)
Sodium: 138 mmol/L (ref 135–145)

## 2014-10-09 LAB — CBC
HCT: 40.1 % (ref 36.0–46.0)
Hemoglobin: 13.1 g/dL (ref 12.0–15.0)
MCH: 29.6 pg (ref 26.0–34.0)
MCHC: 32.7 g/dL (ref 30.0–36.0)
MCV: 90.5 fL (ref 78.0–100.0)
PLATELETS: 246 10*3/uL (ref 150–400)
RBC: 4.43 MIL/uL (ref 3.87–5.11)
RDW: 15.4 % (ref 11.5–15.5)
WBC: 20.2 10*3/uL — AB (ref 4.0–10.5)

## 2014-10-09 LAB — I-STAT TROPONIN, ED: Troponin i, poc: 0 ng/mL (ref 0.00–0.08)

## 2014-10-09 LAB — URINE MICROSCOPIC-ADD ON

## 2014-10-09 LAB — I-STAT CG4 LACTIC ACID, ED: LACTIC ACID, VENOUS: 2.36 mmol/L — AB (ref 0.5–2.0)

## 2014-10-09 LAB — RAPID STREP SCREEN (MED CTR MEBANE ONLY): Streptococcus, Group A Screen (Direct): NEGATIVE

## 2014-10-09 LAB — LIPASE, BLOOD: LIPASE: 22 U/L (ref 22–51)

## 2014-10-09 MED ORDER — KETOROLAC TROMETHAMINE 30 MG/ML IJ SOLN
30.0000 mg | Freq: Once | INTRAMUSCULAR | Status: AC
  Administered 2014-10-09: 30 mg via INTRAVENOUS
  Filled 2014-10-09: qty 1

## 2014-10-09 MED ORDER — IOHEXOL 300 MG/ML  SOLN
25.0000 mL | Freq: Once | INTRAMUSCULAR | Status: AC | PRN
  Administered 2014-10-09: 25 mL via ORAL

## 2014-10-09 MED ORDER — IOHEXOL 300 MG/ML  SOLN
100.0000 mL | Freq: Once | INTRAMUSCULAR | Status: AC | PRN
  Administered 2014-10-09: 100 mL via INTRAVENOUS

## 2014-10-09 MED ORDER — SODIUM CHLORIDE 0.9 % IV BOLUS (SEPSIS)
1000.0000 mL | Freq: Once | INTRAVENOUS | Status: AC
  Administered 2014-10-09: 1000 mL via INTRAVENOUS

## 2014-10-09 NOTE — ED Notes (Signed)
Pt sts generalized body aches and CP starting today; pt sts some cough today as well

## 2014-10-09 NOTE — ED Provider Notes (Signed)
CSN: [id number]     Arrival date & time 10/09/14  1818 History   First MD Initiated Contact with Patient 10/09/14 2008     Chief Complaint  Patient presents with  . Chest Pain  . Generalized Body Aches     (Consider location/radiation/quality/duration/timing/severity/associated sxs/prior Treatment) HPI Comments: Patient states several hours ago.  She had acute onset of generalized body aches and chest discomfort.  She coughed once.  She has no nausea, vomiting, diarrhea or abdominal pain.  Denies URI symptoms, fever or sore throat.  She has not taken anything for her discomfort.  Patient is a 54 y.o. female presenting with chest pain. The history is provided by the patient.  Chest Pain Pain location:  Unable to specify Pain quality: aching   Radiates to: Everywhere. Pain radiates to the back: yes   Pain severity:  Severe Onset quality:  Sudden Timing:  Constant Progression:  Unchanged Chronicity:  Recurrent Relieved by:  None tried Worsened by:  Nothing tried Ineffective treatments:  None tried Associated symptoms: abdominal pain, back pain and fatigue   Associated symptoms: no cough, no fever, no headache, no lower extremity edema, no nausea, no near-syncope, no numbness, no palpitations, no shortness of breath, no syncope, not vomiting and no weakness     Past Medical History  Diagnosis Date  . Depression   . MRSA (methicillin resistant Staphylococcus aureus)   . Hypertension   . GERD (gastroesophageal reflux disease)   . Asthma    Past Surgical History  Procedure Laterality Date  . Abdominal hysterectomy     Family History  Problem Relation Age of Onset  . Colon cancer Maternal Grandfather 92  . Breast cancer Maternal Aunt    Social History  Substance Use Topics  . Smoking status: Current Every Day Smoker -- 1.00 packs/day    Types: Cigarettes    Last Attempt to Quit: 11/27/2012  . Smokeless tobacco: Never Used  . Alcohol Use: No   OB History    No data  available     Review of Systems  Constitutional: Positive for fatigue. Negative for fever and chills.  HENT: Negative for congestion, rhinorrhea and sore throat.   Respiratory: Negative for cough, shortness of breath and wheezing.   Cardiovascular: Positive for chest pain. Negative for palpitations, leg swelling, syncope and near-syncope.  Gastrointestinal: Positive for abdominal pain. Negative for nausea, vomiting and abdominal distention.  Genitourinary: Negative for dysuria.  Musculoskeletal: Positive for back pain and arthralgias. Negative for neck stiffness.  Skin: Negative for rash.  Neurological: Negative for weakness, numbness and headaches.  All other systems reviewed and are negative.     Allergies  Doxycycline  Home Medications   Prior to Admission medications   Medication Sig Start Date End Date Taking? Authorizing Provider  busPIRone (BUSPAR) 10 MG tablet Take 10 mg by mouth daily.    Yes Historical Provider, MD  cyclobenzaprine (FLEXERIL) 10 MG tablet Take 1 tablet (10 mg total) by mouth 2 (two) times daily as needed for muscle spasms. 05/08/14  Yes Tresa Garter, MD  EPINEPHrine (EPIPEN IJ) Inject 1 each as directed once as needed (for allergic reaction).   Yes Historical Provider, MD  esomeprazole (NEXIUM) 40 MG capsule Take 1 capsule (40 mg total) by mouth daily. Patient taking differently: Take 40 mg by mouth daily as needed (acid reflux, heartburn).  05/08/14  Yes Tresa Garter, MD  gabapentin (NEURONTIN) 100 MG capsule Take 1 capsule (100 mg total) by mouth 3 (three)  times daily. Patient taking differently: Take 100 mg by mouth 3 (three) times daily as needed (nerve pain).  05/08/14  Yes Tresa Garter, MD  Specialty Vitamins Products (WEIGHT LOSS DAILY MULTI PO) Take 1 tablet by mouth 2 (two) times daily.   Yes Historical Provider, MD  acetaminophen-codeine (TYLENOL #3) 300-30 MG per tablet Take 1 tablet by mouth every 4 (four) hours as  needed. Patient not taking: Reported on 10/09/2014 09/04/14   Tresa Garter, MD  amoxicillin (AMOXIL) 500 MG capsule Take 2 capsules (1,000 mg total) by mouth 2 (two) times daily. Patient not taking: Reported on 10/09/2014 05/21/14   Tresa Garter, MD  clarithromycin (BIAXIN) 500 MG tablet Take 1 tablet (500 mg total) by mouth 2 (two) times daily. Patient not taking: Reported on 10/09/2014 05/21/14   Tresa Garter, MD  sucralfate (CARAFATE) 1 GM/10ML suspension Take 10 mLs (1 g total) by mouth 4 (four) times daily -  with meals and at bedtime. Patient not taking: Reported on 10/09/2014 05/08/14   Tresa Garter, MD   BP 101/59 mmHg  Pulse 103  Temp(Src) 98.1 F (36.7 C) (Oral)  Resp 18  SpO2 99% Physical Exam  Constitutional: She is oriented to person, place, and time. She appears well-developed and well-nourished.  HENT:  Head: Normocephalic.  Right Ear: External ear normal.  Left Ear: External ear normal.  Mouth/Throat: Oropharynx is clear and moist.  Eyes: Pupils are equal, round, and reactive to light.  Neck: Normal range of motion.  Cardiovascular: Regular rhythm.  Tachycardia present.   Pulmonary/Chest: Effort normal and breath sounds normal. No respiratory distress. She has no wheezes.  Abdominal: Soft. She exhibits no distension. There is no tenderness.  Musculoskeletal: Normal range of motion.  Lymphadenopathy:    She has no cervical adenopathy.  Neurological: She is alert and oriented to person, place, and time.  Skin: Skin is warm.  Nursing note and vitals reviewed.   ED Course  Procedures (including critical care time) Labs Review Labs Reviewed  BASIC METABOLIC PANEL - Abnormal; Notable for the following:    Glucose, Bld 141 (*)    Creatinine, Ser 1.26 (*)    GFR calc non Af Amer 48 (*)    GFR calc Af Amer 56 (*)    All other components within normal limits  CBC - Abnormal; Notable for the following:    WBC 20.2 (*)    All other components  within normal limits  URINALYSIS, ROUTINE W REFLEX MICROSCOPIC (NOT AT [facility]) - Abnormal; Notable for the following:    APPearance CLOUDY (*)    Leukocytes, UA SMALL (*)    All other components within normal limits  HEPATIC FUNCTION PANEL - Abnormal; Notable for the following:    ALT 12 (*)    Bilirubin, Direct <0.1 (*)    All other components within normal limits  URINE MICROSCOPIC-ADD ON - Abnormal; Notable for the following:    Squamous Epithelial / LPF FEW (*)    Bacteria, UA FEW (*)    Casts HYALINE CASTS (*)    All other components within normal limits  CBC WITH DIFFERENTIAL/PLATELET - Abnormal; Notable for the following:    WBC 24.9 (*)    Hemoglobin 11.9 (*)    Neutro Abs 21.4 (*)    Monocytes Absolute 1.1 (*)    All other components within normal limits  I-STAT CG4 LACTIC ACID, ED - Abnormal; Notable for the following:    Lactic Acid, Venous 2.36 (*)  All other components within normal limits  RAPID STREP SCREEN (NOT AT [facility])  CULTURE, GROUP A STREP  CULTURE, BLOOD (ROUTINE X 2)  CULTURE, BLOOD (ROUTINE X 2)  URINE CULTURE  LIPASE, BLOOD  HEPATITIS PANEL, ACUTE  CK  I-STAT TROPOININ, ED  I-STAT CG4 LACTIC ACID, ED  I-STAT TROPOININ, ED  I-STAT CG4 LACTIC ACID, ED    Imaging Review Dg Chest 2 View  10/09/2014   CLINICAL DATA:  Severe body aches and shortness of breath since yesterday.  EXAM: CHEST  2 VIEW  COMPARISON:  October 25, 2012  FINDINGS: The heart size and mediastinal contours are within normal limits. Both lungs are clear. There is scoliosis of spine.  IMPRESSION: No active cardiopulmonary disease.   Electronically Signed   By: Abelardo Diesel M.D.   On: 10/09/2014 19:14   Ct Abdomen Pelvis W Contrast  10/10/2014   CLINICAL DATA:  Left lower quadrant pain and tenderness. Generalized body aches and vomiting for 1 day.  EXAM: CT ABDOMEN AND PELVIS WITH CONTRAST  TECHNIQUE: Multidetector CT imaging of the abdomen and pelvis was performed using the standard  protocol following bolus administration of intravenous contrast.  CONTRAST:  138mL OMNIPAQUE IOHEXOL 300 MG/ML  SOLN  COMPARISON:  None.  FINDINGS: Lower chest:  The included lung bases are clear.  Liver: No focal lesion.  Normal in size.  Question mild steatosis.  Hepatobiliary: Gallbladder physiologically distended. No calcified gallstone. No biliary dilatation.  Pancreas: Normal.  No ductal dilatation or surrounding inflammation.  Spleen: Normal.  Adrenal glands: No nodule.  Kidneys: Symmetric renal enhancement and excretion. No hydronephrosis. No localizing renal abnormality.  Stomach/Bowel: Mild distal esophageal wall thickening. Stomach physiologically distended. There are no dilated or thickened small bowel loops. Small volume of stool throughout the colon without colonic wall thickening. The sigmoid colon is tortuous. No significant diverticular disease. The appendix is normal.  Vascular/Lymphatic: No retroperitoneal adenopathy. Abdominal aorta is normal in caliber. Mild atherosclerosis of the distal abdominal aorta and its branches.  Reproductive: The uterus is surgically absent. The ovaries are not discretely visualized.  Bladder: Decompressed and not well evaluated. Questionable perivesicular inflammatory change.  Other: No free air, free fluid, or intra-abdominal fluid collection.  Musculoskeletal: There are no acute or suspicious osseous abnormalities.  IMPRESSION: 1. Questionable perivesicular inflammatory change, may reflect urinary tract infection. Recommend correlation with urinalysis. 2. There is otherwise no acute abnormality.   Electronically Signed   By: Jeb Levering M.D.   On: 10/10/2014 00:07   I have personally reviewed and evaluated these images and lab results as part of my medical decision-making.   EKG Interpretation   Date/Time:  Thursday October 09 2014 18:23:11 EDT Ventricular Rate:  116 PR Interval:  172 QRS Duration: 86 QT Interval:  320 QTC Calculation: 444 R  Axis:   47 Text Interpretation:  Sinus tachycardia Otherwise normal ECG No  significant change since last tracing Confirmed by YAO  MD, DAVID ([id number])  on 10/09/2014 8:15:12 PM     patient was feeling marginally better after IV fluids, Toradol after this, she was reexamined and she had some left lower quadrant discomfort with deep palpation.  CT scan was obtained.  It is negative for any pathology.  White count continues to be elevated with positive neutrophils.  Patient will be admitted to follow and trend.  White count as there is no specific source of infection at this time. I did speak with the hospitalist.  He requests blood cultures, urine culture  added to the previous sample.  Repeat lactic acid and a CK and troponin  MDM   Final diagnoses:  Leukocytosis  Myalgia         Junius Creamer, NP 10/10/14 0103  Wandra Arthurs, MD 10/10/14 [phone]

## 2014-10-10 ENCOUNTER — Inpatient Hospital Stay (HOSPITAL_COMMUNITY): Payer: Commercial Managed Care - HMO

## 2014-10-10 ENCOUNTER — Encounter (HOSPITAL_COMMUNITY): Payer: Self-pay | Admitting: Internal Medicine

## 2014-10-10 DIAGNOSIS — R6510 Systemic inflammatory response syndrome (SIRS) of non-infectious origin without acute organ dysfunction: Secondary | ICD-10-CM | POA: Diagnosis present

## 2014-10-10 DIAGNOSIS — Z800 Family history of malignant neoplasm of digestive organs: Secondary | ICD-10-CM | POA: Diagnosis not present

## 2014-10-10 DIAGNOSIS — E872 Acidosis: Secondary | ICD-10-CM | POA: Diagnosis present

## 2014-10-10 DIAGNOSIS — D72829 Elevated white blood cell count, unspecified: Secondary | ICD-10-CM

## 2014-10-10 DIAGNOSIS — K219 Gastro-esophageal reflux disease without esophagitis: Secondary | ICD-10-CM | POA: Diagnosis present

## 2014-10-10 DIAGNOSIS — M791 Myalgia, unspecified site: Secondary | ICD-10-CM | POA: Insufficient documentation

## 2014-10-10 DIAGNOSIS — F419 Anxiety disorder, unspecified: Secondary | ICD-10-CM | POA: Diagnosis present

## 2014-10-10 DIAGNOSIS — I10 Essential (primary) hypertension: Secondary | ICD-10-CM | POA: Diagnosis present

## 2014-10-10 DIAGNOSIS — F17200 Nicotine dependence, unspecified, uncomplicated: Secondary | ICD-10-CM | POA: Diagnosis not present

## 2014-10-10 DIAGNOSIS — R7302 Impaired glucose tolerance (oral): Secondary | ICD-10-CM

## 2014-10-10 DIAGNOSIS — F329 Major depressive disorder, single episode, unspecified: Secondary | ICD-10-CM | POA: Diagnosis present

## 2014-10-10 DIAGNOSIS — J45909 Unspecified asthma, uncomplicated: Secondary | ICD-10-CM | POA: Diagnosis present

## 2014-10-10 DIAGNOSIS — Z23 Encounter for immunization: Secondary | ICD-10-CM | POA: Diagnosis not present

## 2014-10-10 DIAGNOSIS — R109 Unspecified abdominal pain: Secondary | ICD-10-CM | POA: Diagnosis present

## 2014-10-10 DIAGNOSIS — F17210 Nicotine dependence, cigarettes, uncomplicated: Secondary | ICD-10-CM | POA: Diagnosis present

## 2014-10-10 DIAGNOSIS — A419 Sepsis, unspecified organism: Secondary | ICD-10-CM | POA: Diagnosis present

## 2014-10-10 DIAGNOSIS — Z803 Family history of malignant neoplasm of breast: Secondary | ICD-10-CM | POA: Diagnosis not present

## 2014-10-10 LAB — CBC WITH DIFFERENTIAL/PLATELET
BASOS ABS: 0 10*3/uL (ref 0.0–0.1)
BASOS ABS: 0 10*3/uL (ref 0.0–0.1)
BASOS PCT: 0 %
BASOS PCT: 0 %
EOS PCT: 0 %
EOS PCT: 0 %
Eosinophils Absolute: 0 10*3/uL (ref 0.0–0.7)
Eosinophils Absolute: 0.1 10*3/uL (ref 0.0–0.7)
HCT: 35.4 % — ABNORMAL LOW (ref 36.0–46.0)
HEMATOCRIT: 36.6 % (ref 36.0–46.0)
Hemoglobin: 11.9 g/dL — ABNORMAL LOW (ref 12.0–15.0)
Hemoglobin: 11.7 g/dL — ABNORMAL LOW (ref 12.0–15.0)
LYMPHS PCT: 10 %
LYMPHS PCT: 20 %
Lymphs Abs: 2.5 10*3/uL (ref 0.7–4.0)
Lymphs Abs: 4.8 10*3/uL — ABNORMAL HIGH (ref 0.7–4.0)
MCH: 29 pg (ref 26.0–34.0)
MCH: 29.7 pg (ref 26.0–34.0)
MCHC: 32.5 g/dL (ref 30.0–36.0)
MCHC: 33.1 g/dL (ref 30.0–36.0)
MCV: 89.3 fL (ref 78.0–100.0)
MCV: 89.8 fL (ref 78.0–100.0)
MONO ABS: 1.1 10*3/uL — AB (ref 0.1–1.0)
Monocytes Absolute: 1.1 10*3/uL — ABNORMAL HIGH (ref 0.1–1.0)
Monocytes Relative: 4 %
Monocytes Relative: 4 %
NEUTROS ABS: 21.4 10*3/uL — AB (ref 1.7–7.7)
Neutro Abs: 18.7 10*3/uL — ABNORMAL HIGH (ref 1.7–7.7)
Neutrophils Relative %: 86 %
Neutrophils Relative %: 76 %
PLATELETS: 227 10*3/uL (ref 150–400)
PLATELETS: 220 10*3/uL (ref 150–400)
RBC: 4.1 MIL/uL (ref 3.87–5.11)
RBC: 3.94 MIL/uL (ref 3.87–5.11)
RDW: 15.1 % (ref 11.5–15.5)
RDW: 15.3 % (ref 11.5–15.5)
WBC: 24.9 10*3/uL — AB (ref 4.0–10.5)
WBC: 24.7 10*3/uL — ABNORMAL HIGH (ref 4.0–10.5)

## 2014-10-10 LAB — COMPREHENSIVE METABOLIC PANEL
ALBUMIN: 3.3 g/dL — AB (ref 3.5–5.0)
ALK PHOS: 86 U/L (ref 38–126)
ALT: 11 U/L — ABNORMAL LOW (ref 14–54)
AST: 24 U/L (ref 15–41)
Anion gap: 7 (ref 5–15)
BILIRUBIN TOTAL: 0.4 mg/dL (ref 0.3–1.2)
BUN: 10 mg/dL (ref 6–20)
CALCIUM: 8.6 mg/dL — AB (ref 8.9–10.3)
CO2: 24 mmol/L (ref 22–32)
Chloride: 110 mmol/L (ref 101–111)
Creatinine, Ser: 1.16 mg/dL — ABNORMAL HIGH (ref 0.44–1.00)
GFR calc Af Amer: >60 mL/min (ref >60)
GFR calc non Af Amer: 53 mL/min — ABNORMAL LOW (ref >60)
GLUCOSE: 151 mg/dL — AB (ref 65–99)
Potassium: 3.7 mmol/L (ref 3.5–5.1)
Sodium: 141 mmol/L (ref 135–145)
TOTAL PROTEIN: 6.1 g/dL — AB (ref 6.5–8.1)

## 2014-10-10 LAB — MRSA PCR SCREENING: MRSA BY PCR: POSITIVE — AB

## 2014-10-10 LAB — LACTIC ACID, PLASMA
Lactic Acid, Venous: 1.4 mmol/L (ref 0.5–2.0)
Lactic Acid, Venous: 1.6 mmol/L (ref 0.5–2.0)

## 2014-10-10 LAB — RAPID URINE DRUG SCREEN, HOSP PERFORMED
Amphetamines: NOT DETECTED
BENZODIAZEPINES: NOT DETECTED
Barbiturates: NOT DETECTED
Cocaine: NOT DETECTED
Opiates: POSITIVE — AB
Tetrahydrocannabinol: NOT DETECTED

## 2014-10-10 LAB — CG4 I-STAT (LACTIC ACID)
Lactic Acid, Venous: 2.4 mmol/L (ref 0.5–2.0)
Lactic Acid, Venous: 0.88 mmol/L (ref 0.5–2.0)

## 2014-10-10 LAB — APTT: APTT: 30 s (ref 24–37)

## 2014-10-10 LAB — PROTIME-INR
INR: 1.12 (ref 0.00–1.49)
PROTHROMBIN TIME: 14.6 s (ref 11.6–15.2)

## 2014-10-10 LAB — HEPATITIS PANEL, ACUTE
HCV Ab: <0.1 s/co ratio (ref 0.0–0.9)
HEP B S AG: NEGATIVE
Hep A IgM: NEGATIVE
Hep B C IgM: NEGATIVE

## 2014-10-10 LAB — PROCALCITONIN: PROCALCITONIN: 3.3 ng/mL

## 2014-10-10 LAB — GLUCOSE, CAPILLARY
GLUCOSE-CAPILLARY: 108 mg/dL — AB (ref 65–99)
Glucose-Capillary: 105 mg/dL — ABNORMAL HIGH (ref 65–99)

## 2014-10-10 LAB — POCT I-STAT TROPONIN I: TROPONIN I, POC: 0 ng/mL (ref 0.00–0.08)

## 2014-10-10 LAB — CK: Total CK: 98 U/L (ref 38–234)

## 2014-10-10 MED ORDER — ENOXAPARIN SODIUM 40 MG/0.4ML ~~LOC~~ SOLN
40.0000 mg | Freq: Every day | SUBCUTANEOUS | Status: DC
  Administered 2014-10-10 – 2014-10-12 (×3): 40 mg via SUBCUTANEOUS
  Filled 2014-10-10 (×4): qty 0.4

## 2014-10-10 MED ORDER — ONDANSETRON HCL 4 MG/2ML IJ SOLN: 4.0000 mg | Freq: Four times a day (QID) | INTRAMUSCULAR | Status: DC | PRN

## 2014-10-10 MED ORDER — ONDANSETRON HCL 4 MG PO TABS: 4.0000 mg | ORAL_TABLET | Freq: Four times a day (QID) | ORAL | Status: DC | PRN

## 2014-10-10 MED ORDER — PIPERACILLIN-TAZOBACTAM 3.375 G IVPB 30 MIN: 3.3750 g | Freq: Three times a day (TID) | INTRAVENOUS | Status: DC

## 2014-10-10 MED ORDER — BUSPIRONE HCL 10 MG PO TABS
10.0000 mg | ORAL_TABLET | Freq: Every day | ORAL | Status: DC
  Administered 2014-10-10 – 2014-10-13 (×4): 10 mg via ORAL
  Filled 2014-10-10 (×4): qty 1

## 2014-10-10 MED ORDER — DEXTROSE 5 % IV SOLN
1.0000 g | Freq: Every day | INTRAVENOUS | Status: DC
  Administered 2014-10-10: 1 g via INTRAVENOUS
  Filled 2014-10-10 (×2): qty 10

## 2014-10-10 MED ORDER — MORPHINE SULFATE (PF) 4 MG/ML IV SOLN
4.0000 mg | Freq: Once | INTRAVENOUS | Status: AC
  Administered 2014-10-10: 4 mg via INTRAVENOUS
  Filled 2014-10-10: qty 1

## 2014-10-10 MED ORDER — ONDANSETRON HCL 4 MG/2ML IJ SOLN
4.0000 mg | Freq: Once | INTRAMUSCULAR | Status: AC
  Administered 2014-10-10: 4 mg via INTRAVENOUS
  Filled 2014-10-10: qty 2

## 2014-10-10 MED ORDER — VANCOMYCIN HCL 10 G IV SOLR
1500.0000 mg | Freq: Once | INTRAVENOUS | Status: AC
  Administered 2014-10-10: 1500 mg via INTRAVENOUS
  Filled 2014-10-10: qty 1500

## 2014-10-10 MED ORDER — MUPIROCIN 2 % EX OINT
1.0000 "application " | TOPICAL_OINTMENT | Freq: Two times a day (BID) | CUTANEOUS | Status: DC
  Administered 2014-10-10 – 2014-10-13 (×7): 1 via NASAL
  Filled 2014-10-10 (×3): qty 22

## 2014-10-10 MED ORDER — ACETAMINOPHEN 650 MG RE SUPP: 650.0000 mg | Freq: Four times a day (QID) | RECTAL | Status: DC | PRN

## 2014-10-10 MED ORDER — PIPERACILLIN-TAZOBACTAM 3.375 G IVPB
3.3750 g | Freq: Three times a day (TID) | INTRAVENOUS | Status: DC
  Administered 2014-10-10 – 2014-10-12 (×6): 3.375 g via INTRAVENOUS
  Filled 2014-10-10 (×8): qty 50

## 2014-10-10 MED ORDER — SODIUM CHLORIDE 0.9 % IV SOLN
INTRAVENOUS | Status: AC
  Administered 2014-10-10 (×2): via INTRAVENOUS

## 2014-10-10 MED ORDER — GABAPENTIN 100 MG PO CAPS: 100.0000 mg | ORAL_CAPSULE | Freq: Three times a day (TID) | ORAL | Status: DC | PRN

## 2014-10-10 MED ORDER — VANCOMYCIN HCL IN DEXTROSE 750-5 MG/150ML-% IV SOLN
750.0000 mg | Freq: Two times a day (BID) | INTRAVENOUS | Status: DC
  Administered 2014-10-11 – 2014-10-13 (×6): 750 mg via INTRAVENOUS
  Filled 2014-10-10 (×8): qty 150

## 2014-10-10 MED ORDER — CHLORHEXIDINE GLUCONATE CLOTH 2 % EX PADS
6.0000 | MEDICATED_PAD | Freq: Every day | CUTANEOUS | Status: DC
  Administered 2014-10-11 – 2014-10-13 (×3): 6 via TOPICAL

## 2014-10-10 MED ORDER — INFLUENZA VAC SPLIT QUAD 0.5 ML IM SUSY
0.5000 mL | PREFILLED_SYRINGE | INTRAMUSCULAR | Status: DC
  Filled 2014-10-10: qty 0.5

## 2014-10-10 MED ORDER — PANTOPRAZOLE SODIUM 40 MG PO TBEC
40.0000 mg | DELAYED_RELEASE_TABLET | Freq: Every day | ORAL | Status: DC
  Administered 2014-10-10 – 2014-10-13 (×4): 40 mg via ORAL
  Filled 2014-10-10 (×4): qty 1

## 2014-10-10 MED ORDER — CYCLOBENZAPRINE HCL 10 MG PO TABS: 10.0000 mg | ORAL_TABLET | Freq: Two times a day (BID) | ORAL | Status: DC | PRN

## 2014-10-10 MED ORDER — SODIUM CHLORIDE 0.9 % IV SOLN
Freq: Once | INTRAVENOUS | Status: AC
  Administered 2014-10-10: 01:00:00 via INTRAVENOUS

## 2014-10-10 MED ORDER — ACETAMINOPHEN 325 MG PO TABS
650.0000 mg | ORAL_TABLET | Freq: Four times a day (QID) | ORAL | Status: DC | PRN
  Administered 2014-10-11: 650 mg via ORAL
  Filled 2014-10-10: qty 2

## 2014-10-10 MED ORDER — INSULIN ASPART 100 UNIT/ML ~~LOC~~ SOLN: 0.0000 [IU] | Freq: Three times a day (TID) | SUBCUTANEOUS | Status: DC

## 2014-10-10 NOTE — Progress Notes (Signed)
ANTIBIOTIC CONSULT NOTE - INITIAL  Pharmacy Consult for vancomycin Indication: sepsis  Allergies  Allergen Reactions  . Doxycycline Swelling and Rash    Face swelling    Patient Measurements: Height: 5\' 4"  (162.6 cm) Weight: 172 lb 8 oz (78.245 kg) IBW/kg (Calculated) : 54.7   Vital Signs: Temp: 98.3 F (36.8 C) (09/30 0500) Temp Source: Oral (09/30 0500) BP: 102/59 mmHg (09/30 0500) Pulse Rate: 89 (09/30 0500) Intake/Output from previous day: 09/29 0701 - 09/30 0700 In: 919.2 [P.O.:240; I.V.:629.2; IV Piggyback:50] Out: 250 [Urine:250] Intake/Output from this shift: Total I/O In: 240 [P.O.:240] Out: 300 [Urine:300]  Labs:  Recent Labs  10/09/14 1830 10/09/14 2302 10/10/14 0351  WBC 20.2* 24.9* 24.7*  HGB 13.1 11.9* 11.7*  PLT 246 227 220  CREATININE 1.26*  --  1.16*   Estimated Creatinine Clearance: 57.4 mL/min (by C-G formula based on Cr of 1.16). No results for input(s): VANCOTROUGH, VANCOPEAK, VANCORANDOM, GENTTROUGH, GENTPEAK, GENTRANDOM, TOBRATROUGH, TOBRAPEAK, TOBRARND, AMIKACINPEAK, AMIKACINTROU, AMIKACIN in the last 72 hours.   Microbiology: Recent Results (from the past 720 hour(s))  Rapid strep screen (not at [facility])     Status: None   Collection Time: 10/09/14 10:05 PM  Result Value Ref Range Status   Streptococcus, Group A Screen (Direct) NEGATIVE NEGATIVE Final    Comment: (NOTE) A Rapid Antigen test may result negative if the antigen level in the sample is below the detection level of this test. The FDA has not cleared this test as a stand-alone test therefore the rapid antigen negative result has reflexed to a Group A Strep culture.   MRSA PCR Screening     Status: Abnormal   Collection Time: 10/10/14  2:27 AM  Result Value Ref Range Status   MRSA by PCR POSITIVE (A) NEGATIVE Final    Comment:        The GeneXpert MRSA Assay (FDA approved for NASAL specimens only), is one component of a comprehensive MRSA colonization surveillance  program. It is not intended to diagnose MRSA infection nor to guide or monitor treatment for MRSA infections. RESULT CALLED TO, READ BACK BY AND VERIFIED WITH: MIKE FUTRELL @0515  10/10/14 MKELLY     Medical History: Past Medical History  Diagnosis Date  . Depression   . MRSA (methicillin resistant Staphylococcus aureus)   . Hypertension   . GERD (gastroesophageal reflux disease)   . Asthma     Medications:  Prescriptions prior to admission  Medication Sig Dispense Refill Last Dose  . busPIRone (BUSPAR) 10 MG tablet Take 10 mg by mouth daily.    1-2 weeks ago  . cyclobenzaprine (FLEXERIL) 10 MG tablet Take 1 tablet (10 mg total) by mouth 2 (two) times daily as needed for muscle spasms. 60 tablet 3 Past Month at Unknown time  . EPINEPHrine (EPIPEN IJ) Inject 1 each as directed once as needed (for allergic reaction).   unknown  . esomeprazole (NEXIUM) 40 MG capsule Take 1 capsule (40 mg total) by mouth daily. (Patient taking differently: Take 40 mg by mouth daily as needed (acid reflux, heartburn). ) 30 capsule 3 over 30 days  . gabapentin (NEURONTIN) 100 MG capsule Take 1 capsule (100 mg total) by mouth 3 (three) times daily. (Patient taking differently: Take 100 mg by mouth 3 (three) times daily as needed (nerve pain). ) 90 capsule 3 over 30 days  . Specialty Vitamins Products (WEIGHT LOSS DAILY MULTI PO) Take 1 tablet by mouth 2 (two) times daily.   10/09/2014 at Unknown time  .  acetaminophen-codeine (TYLENOL #3) 300-30 MG per tablet Take 1 tablet by mouth every 4 (four) hours as needed. (Patient not taking: Reported on 10/09/2014) 60 tablet 0 Not Taking at Unknown time  . amoxicillin (AMOXIL) 500 MG capsule Take 2 capsules (1,000 mg total) by mouth 2 (two) times daily. (Patient not taking: Reported on 10/09/2014) 56 capsule 0 Completed Course at Unknown time  . clarithromycin (BIAXIN) 500 MG tablet Take 1 tablet (500 mg total) by mouth 2 (two) times daily. (Patient not taking: Reported  on 10/09/2014) 28 tablet 0 Completed Course at Unknown time  . sucralfate (CARAFATE) 1 GM/10ML suspension Take 10 mLs (1 g total) by mouth 4 (four) times daily -  with meals and at bedtime. (Patient not taking: Reported on 10/09/2014) 420 mL 0 Not Taking at Unknown time   Scheduled:  . busPIRone  10 mg Oral Daily  . Chlorhexidine Gluconate Cloth  6 each Topical Q0600  . enoxaparin (LOVENOX) injection  40 mg Subcutaneous Daily  . Influenza vac split quadrivalent PF  0.5 mL Intramuscular Tomorrow-1000  . insulin aspart  0-9 Units Subcutaneous TID WC  . mupirocin ointment  1 application Nasal BID  . pantoprazole  40 mg Oral Daily  . piperacillin-tazobactam (ZOSYN)  IV  3.375 g Intravenous 3 times per day    Assessment: 54 yo female on with possible sepsis on rocephin to change to vancomycin and zosyn.  WBC= 24.7, PCT= 3.3, LA 2.4>> 1.6, SCr= 1.16 and CrCl ~ 60.     9/30 zosyn>> 9/30 vanc>> 9/30 rocephin>> 9/30  9/30 urine 9/30 blood x2 9/28 strep screen neg  Goal of Therapy:  Vancomycin trough level 15-20 mcg/ml  Plan:  -Continue zosyn 3.375gm IV q8h -Vancomycin 1500mg  IV x1 followed by 750mg  IV q12h -Will follow renal function, cultures and clinical progress  Hildred Laser, Pharm D 10/10/2014 2:14 PM

## 2014-10-10 NOTE — Plan of Care (Signed)
Problem: Phase I Progression Outcomes Goal: OOB as tolerated unless otherwise ordered Outcome: Completed/Met Date Met:  10/10/14 Up and about in room and tolerating well

## 2014-10-10 NOTE — Progress Notes (Signed)
Utilization Review Completed.Donne Anon T9/30/2016

## 2014-10-10 NOTE — Progress Notes (Signed)
PROGRESS NOTE  Crystal Stafford YKZ:[id number] DOB: [birth year] DOA: 10/09/2014 PCP: Angelica Chessman, MD  Brief history 54 year old female with a history of depression/anxiety, hypertension, GERD, tobacco use, impaired glucose tolerance presented with one-day history of generalized weakness, myalgias, and suprapubic abdominal pain. The patient denied any nausea, vomiting, diarrhea, dysuria, hematuria, hematochezia, melena. She denied any synovitis or unusual rashes. She has subjective fevers and chills, but when she took her temperature, it was 98.38F. The patient denies any headaches or visual disturbance. Evaluation revealed lactic acid 2.40 with WBC 24.9. As a result, the patient was worked up for possible infectious source. Blood cultures and urine cultures were obtained, the patient was started on intravenous antibiotics. Assessment/Plan: Leukocytosis/lactic acidosis/SIRS -Broad-spectrum antibiotics until clarification regarding infectious source -d/c ceftriaxone -start vanco and zosyn -Follow-up blood cultures -Chest x-ray negative for any infiltrates or edema -Urinalysis without any significant pyuria -PCT 3.30 -Lactic acid 2.40--> 0.88 -Check HIV -Continue IV fluids  Tobacco abuse -Tobacco cessation discussed Impaired glucose tolerance -09/04/2014 hemoglobin A1c 6.1 Atypical chest pain -No symptoms of angina -EKG negative for concerning ischemic changes -complete reproducible on exam  Family Communication:   Daughter updated at beside Disposition Plan:   Home when medically stable      Procedures/Studies: Dg Chest 2 View  10/09/2014   CLINICAL DATA:  Severe body aches and shortness of breath since yesterday.  EXAM: CHEST  2 VIEW  COMPARISON:  October 25, 2012  FINDINGS: The heart size and mediastinal contours are within normal limits. Both lungs are clear. There is scoliosis of spine.  IMPRESSION: No active cardiopulmonary disease.   Electronically Signed   By:  Abelardo Diesel M.D.   On: 10/09/2014 19:14   Ct Abdomen Pelvis W Contrast  10/10/2014   CLINICAL DATA:  Left lower quadrant pain and tenderness. Generalized body aches and vomiting for 1 day.  EXAM: CT ABDOMEN AND PELVIS WITH CONTRAST  TECHNIQUE: Multidetector CT imaging of the abdomen and pelvis was performed using the standard protocol following bolus administration of intravenous contrast.  CONTRAST:  135mL OMNIPAQUE IOHEXOL 300 MG/ML  SOLN  COMPARISON:  None.  FINDINGS: Lower chest:  The included lung bases are clear.  Liver: No focal lesion.  Normal in size.  Question mild steatosis.  Hepatobiliary: Gallbladder physiologically distended. No calcified gallstone. No biliary dilatation.  Pancreas: Normal.  No ductal dilatation or surrounding inflammation.  Spleen: Normal.  Adrenal glands: No nodule.  Kidneys: Symmetric renal enhancement and excretion. No hydronephrosis. No localizing renal abnormality.  Stomach/Bowel: Mild distal esophageal wall thickening. Stomach physiologically distended. There are no dilated or thickened small bowel loops. Small volume of stool throughout the colon without colonic wall thickening. The sigmoid colon is tortuous. No significant diverticular disease. The appendix is normal.  Vascular/Lymphatic: No retroperitoneal adenopathy. Abdominal aorta is normal in caliber. Mild atherosclerosis of the distal abdominal aorta and its branches.  Reproductive: The uterus is surgically absent. The ovaries are not discretely visualized.  Bladder: Decompressed and not well evaluated. Questionable perivesicular inflammatory change.  Other: No free air, free fluid, or intra-abdominal fluid collection.  Musculoskeletal: There are no acute or suspicious osseous abnormalities.  IMPRESSION: 1. Questionable perivesicular inflammatory change, may reflect urinary tract infection. Recommend correlation with urinalysis. 2. There is otherwise no acute abnormality.   Electronically Signed   By: Jeb Levering M.D.   On: 10/10/2014 00:07   Dg Chest Port 1 View  10/10/2014   CLINICAL DATA:  Cough. Shortness of breath. Sepsis. Systemic inflammatory response syndrome.  EXAM: PORTABLE CHEST 1 VIEW  COMPARISON:  10/09/2014  FINDINGS: The heart size and mediastinal contours are within normal limits. Both lungs are clear. The visualized skeletal structures are unremarkable.  IMPRESSION: No active disease.   Electronically Signed   By: Earle Gell M.D.   On: 10/10/2014 07:15         Subjective:  vision is feeling better today. She denies any fevers, chills, chest pain, shortness breath, nausea, vomiting, diarrhea, abdominal pain. No headaches or visual disturbance. Denies any rashes or synovitis.  Objective: Filed Vitals:   10/10/14 0115 10/10/14 0154 10/10/14 0156 10/10/14 0500  BP: 103/59 116/69  102/59  Pulse: 95 91 85 89  Temp:  98.3 F (36.8 C)  98.3 F (36.8 C)  TempSrc:  Oral  Oral  Resp: 17 16  18   Height:  5\' 4"  (1.626 m)    Weight:  77.565 kg (171 lb)  78.245 kg (172 lb 8 oz)  SpO2: 97% 98%  99%    Intake/Output Summary (Last 24 hours) at 10/10/14 1340 Last data filed at 10/10/14 1114  Gross per 24 hour  Intake 1159.17 ml  Output    550 ml  Net 609.17 ml   Weight change:  Exam:   General:  Pt is alert, follows commands appropriately, not in acute distress  HEENT: No icterus, No thrush, No neck mass, [place]/AT  Cardiovascular: RRR, S1/S2, no rubs, no gallops  Respiratory: CTA bilaterally, no wheezing, no crackles, no rhonchi  Abdomen: Soft/+BS, non tender, non distended, no guarding; no hepatosplenomegaly  Extremities: No edema, No lymphangitis, No petechiae, No rashes, no synovitis; no cyanosis or clubbing.   Data Reviewed: Basic Metabolic Panel:  Recent Labs Lab 10/09/14 1830 10/10/14 0351  NA 138 141  K 4.2 3.7  CL 105 110  CO2 23 24  GLUCOSE 141* 151*  BUN 9 10  CREATININE 1.26* 1.16*  CALCIUM 9.5 8.6*   Liver Function Tests:  Recent Labs Lab  10/09/14 2302 10/10/14 0351  AST 30 24  ALT 12* 11*  ALKPHOS 91 86  BILITOT 0.3 0.4  PROT 6.9 6.1*  ALBUMIN 3.6 3.3*    Recent Labs Lab 10/09/14 2023  LIPASE 22   No results for input(s): AMMONIA in the last 168 hours. CBC:  Recent Labs Lab 10/09/14 1830 10/09/14 2302 10/10/14 0351  WBC 20.2* 24.9* 24.7*  NEUTROABS  --  21.4* 18.7*  HGB 13.1 11.9* 11.7*  HCT 40.1 36.6 35.4*  MCV 90.5 89.3 89.8  PLT 246 227 220   Cardiac Enzymes:  Recent Labs Lab 10/10/14 0133  CKTOTAL 98   BNP: Invalid input(s): POCBNP CBG:  Recent Labs Lab 10/10/14 1128  GLUCAP 108*    Recent Results (from the past 240 hour(s))  Rapid strep screen (not at [facility])     Status: None   Collection Time: 10/09/14 10:05 PM  Result Value Ref Range Status   Streptococcus, Group A Screen (Direct) NEGATIVE NEGATIVE Final    Comment: (NOTE) A Rapid Antigen test may result negative if the antigen level in the sample is below the detection level of this test. The FDA has not cleared this test as a stand-alone test therefore the rapid antigen negative result has reflexed to a Group A Strep culture.   MRSA PCR Screening     Status: Abnormal   Collection Time: 10/10/14  2:27 AM  Result Value Ref Range Status   MRSA by PCR  POSITIVE (A) NEGATIVE Final    Comment:        The GeneXpert MRSA Assay (FDA approved for NASAL specimens only), is one component of a comprehensive MRSA colonization surveillance program. It is not intended to diagnose MRSA infection nor to guide or monitor treatment for MRSA infections. RESULT CALLED TO, READ BACK BY AND VERIFIED WITH: MIKE FUTRELL @0515  10/10/14 MKELLY      Scheduled Meds: . busPIRone  10 mg Oral Daily  . cefTRIAXone (ROCEPHIN)  IV  1 g Intravenous QHS  . Chlorhexidine Gluconate Cloth  6 each Topical Q0600  . enoxaparin (LOVENOX) injection  40 mg Subcutaneous Daily  . Influenza vac split quadrivalent PF  0.5 mL Intramuscular Tomorrow-1000  .  insulin aspart  0-9 Units Subcutaneous TID WC  . mupirocin ointment  1 application Nasal BID  . pantoprazole  40 mg Oral Daily   Continuous Infusions: . sodium chloride 125 mL/hr at 10/10/14 1114     TAT, DAVID, DO  [facility] Pager [phone]  If 7PM-7AM, please contact night-coverage [email or web address] Password [email or web address] 10/10/2014, 1:40 PM   LOS: 0 days

## 2014-10-10 NOTE — H&P (Signed)
[facility] History and Physical  Crystal Stafford WCB:[id number] DOB: August 15, 1960 DOA: 10/09/2014  Referring physician: Ms. Baker Janus. PCP: Angelica Chessman, MD  Specialists: None.  Chief Complaint: Generalized weakness and body aches.  HPI: Crystal Stafford is a 54 y.o. female with history of depression and prediabetes presents to the ER because of generalized weakness body aches and abdominal pain since yesterday morning. Patient's abdominal pain is mostly suprapubic. Denies any nausea vomiting or diarrhea. Denies any chest pain or shortness of breath. Patient has been feeling weak with generalized fatigue and myalgia and dizziness. In the ER EKG shows sinus tachycardia with lab work showing elevated lactic acid and leukocytosis. UA shows mild leukocyte esterase with CT abdomen and pelvis showing possible perivascular inflammation. Patient has been admitted for further management.   Review of Systems: As presented in the history of presenting illness, rest negative.  Past Medical History  Diagnosis Date  . Depression   . MRSA (methicillin resistant Staphylococcus aureus)   . Hypertension   . GERD (gastroesophageal reflux disease)   . Asthma    Past Surgical History  Procedure Laterality Date  . Abdominal hysterectomy     Social History:  reports that she has been smoking Cigarettes.  She has been smoking about 1.00 pack per day. She has never used smokeless tobacco. She reports that she does not drink alcohol or use illicit drugs. Where does patient live home. Can patient participate in ADLs? Yes.  Allergies  Allergen Reactions  . Doxycycline Swelling and Rash    Face swelling    Family History:  Family History  Problem Relation Age of Onset  . Colon cancer Maternal Grandfather 72  . Breast cancer Maternal Aunt       Prior to Admission medications   Medication Sig Start Date End Date Taking? Authorizing Provider  busPIRone (BUSPAR) 10 MG tablet Take 10 mg by mouth  daily.    Yes Historical Provider, MD  cyclobenzaprine (FLEXERIL) 10 MG tablet Take 1 tablet (10 mg total) by mouth 2 (two) times daily as needed for muscle spasms. 05/08/14  Yes Tresa Garter, MD  EPINEPHrine (EPIPEN IJ) Inject 1 each as directed once as needed (for allergic reaction).   Yes Historical Provider, MD  esomeprazole (NEXIUM) 40 MG capsule Take 1 capsule (40 mg total) by mouth daily. Patient taking differently: Take 40 mg by mouth daily as needed (acid reflux, heartburn).  05/08/14  Yes Tresa Garter, MD  gabapentin (NEURONTIN) 100 MG capsule Take 1 capsule (100 mg total) by mouth 3 (three) times daily. Patient taking differently: Take 100 mg by mouth 3 (three) times daily as needed (nerve pain).  05/08/14  Yes Tresa Garter, MD  Specialty Vitamins Products (WEIGHT LOSS DAILY MULTI PO) Take 1 tablet by mouth 2 (two) times daily.   Yes Historical Provider, MD  acetaminophen-codeine (TYLENOL #3) 300-30 MG per tablet Take 1 tablet by mouth every 4 (four) hours as needed. Patient not taking: Reported on 10/09/2014 09/04/14   Tresa Garter, MD  amoxicillin (AMOXIL) 500 MG capsule Take 2 capsules (1,000 mg total) by mouth 2 (two) times daily. Patient not taking: Reported on 10/09/2014 05/21/14   Tresa Garter, MD  clarithromycin (BIAXIN) 500 MG tablet Take 1 tablet (500 mg total) by mouth 2 (two) times daily. Patient not taking: Reported on 10/09/2014 05/21/14   Tresa Garter, MD  sucralfate (CARAFATE) 1 GM/10ML suspension Take 10 mLs (1 g total) by mouth 4 (four) times daily -  with meals and at bedtime. Patient not taking: Reported on 10/09/2014 05/08/14   Tresa Garter, MD    Physical Exam: Filed Vitals:   10/10/14 0100 10/10/14 0115 10/10/14 0154 10/10/14 0156  BP: 110/56 103/59 116/69   Pulse: 91 95 91 85  Temp:   98.3 F (36.8 C)   TempSrc:   Oral   Resp: 15 17 16    Height:   5\' 4"  (1.626 m)   Weight:   77.565 kg (171 lb)   SpO2: 98% 97% 98%       General:  Moderately built and nourished.  Eyes: Anicteric no pallor.  ENT: No discharge from the ears eyes nose and mouth.  Neck: No mass felt.  Cardiovascular: S1 and S2 heard.  Respiratory: No rhonchi or crepitations.  Abdomen: Mild tenderness suprapubically. No guarding or rigidity.  Skin: No rash.  Musculoskeletal: No edema.  Psychiatric: Appears normal.  Neurologic: Alert awake oriented to time place and person. Moves all extremities.  Labs on Admission:  Basic Metabolic Panel:  Recent Labs Lab 10/09/14 1830  NA 138  K 4.2  CL 105  CO2 23  GLUCOSE 141*  BUN 9  CREATININE 1.26*  CALCIUM 9.5   Liver Function Tests:  Recent Labs Lab 10/09/14 2302  AST 30  ALT 12*  ALKPHOS 91  BILITOT 0.3  PROT 6.9  ALBUMIN 3.6    Recent Labs Lab 10/09/14 2023  LIPASE 22   No results for input(s): AMMONIA in the last 168 hours. CBC:  Recent Labs Lab 10/09/14 1830 10/09/14 2302  WBC 20.2* 24.9*  NEUTROABS  --  21.4*  HGB 13.1 11.9*  HCT 40.1 36.6  MCV 90.5 89.3  PLT 246 227   Cardiac Enzymes: No results for input(s): CKTOTAL, CKMB, CKMBINDEX, TROPONINI in the last 168 hours.  BNP (last 3 results) No results for input(s): BNP in the last 8760 hours.  ProBNP (last 3 results) No results for input(s): PROBNP in the last 8760 hours.  CBG: No results for input(s): GLUCAP in the last 168 hours.  Radiological Exams on Admission: Dg Chest 2 View  10/09/2014   CLINICAL DATA:  Severe body aches and shortness of breath since yesterday.  EXAM: CHEST  2 VIEW  COMPARISON:  October 25, 2012  FINDINGS: The heart size and mediastinal contours are within normal limits. Both lungs are clear. There is scoliosis of spine.  IMPRESSION: No active cardiopulmonary disease.   Electronically Signed   By: Abelardo Diesel M.D.   On: 10/09/2014 19:14   Ct Abdomen Pelvis W Contrast  10/10/2014   CLINICAL DATA:  Left lower quadrant pain and tenderness. Generalized body  aches and vomiting for 1 day.  EXAM: CT ABDOMEN AND PELVIS WITH CONTRAST  TECHNIQUE: Multidetector CT imaging of the abdomen and pelvis was performed using the standard protocol following bolus administration of intravenous contrast.  CONTRAST:  14mL OMNIPAQUE IOHEXOL 300 MG/ML  SOLN  COMPARISON:  None.  FINDINGS: Lower chest:  The included lung bases are clear.  Liver: No focal lesion.  Normal in size.  Question mild steatosis.  Hepatobiliary: Gallbladder physiologically distended. No calcified gallstone. No biliary dilatation.  Pancreas: Normal.  No ductal dilatation or surrounding inflammation.  Spleen: Normal.  Adrenal glands: No nodule.  Kidneys: Symmetric renal enhancement and excretion. No hydronephrosis. No localizing renal abnormality.  Stomach/Bowel: Mild distal esophageal wall thickening. Stomach physiologically distended. There are no dilated or thickened small bowel loops. Small volume of stool throughout the colon without  colonic wall thickening. The sigmoid colon is tortuous. No significant diverticular disease. The appendix is normal.  Vascular/Lymphatic: No retroperitoneal adenopathy. Abdominal aorta is normal in caliber. Mild atherosclerosis of the distal abdominal aorta and its branches.  Reproductive: The uterus is surgically absent. The ovaries are not discretely visualized.  Bladder: Decompressed and not well evaluated. Questionable perivesicular inflammatory change.  Other: No free air, free fluid, or intra-abdominal fluid collection.  Musculoskeletal: There are no acute or suspicious osseous abnormalities.  IMPRESSION: 1. Questionable perivesicular inflammatory change, may reflect urinary tract infection. Recommend correlation with urinalysis. 2. There is otherwise no acute abnormality.   Electronically Signed   By: Jeb Levering M.D.   On: 10/10/2014 00:07    EKG: Independently reviewed. Sinus tachycardia.  Assessment/Plan Principal Problem:   SIRS (systemic inflammatory response  syndrome) Active Problems:   Leukocytosis   1. SIRS possible source could be urinary tract - at this time patient has been placed on aggressive IV fluid hydration and sepsis protocol. Possible source could be urinary tract given the perivesicular inflammation the CAT scan and also UA showing some leukocyte esterase. Patient also has some suprapubic pain. For now I have placed patient on ceftriaxone. Follow blood cultures procalcitonin lactic acid levels. 2. Prediabetes - closely follow CBGs with sliding scale coverage. 3. History of depression and BuSpar. 4. Generalized myalgia and weakness check TSH and CK levels. Could be viral in origin. 5. Mild renal failure which could be chronic - creatinine appears to be at baseline. Close follow metabolic panel.  I have reviewed patient's old charts were labs and personally reviewed patient's EKG and chest x-ray.   DVT Prophylaxis Lovenox.  Code Status: Full code.  Family Communication: Discussed with patient.  Disposition Plan: Admit to inpatient.    KAKRAKANDY,ARSHAD N. [facility] Pager [phone].  If 7PM-7AM, please contact night-coverage [email or web address] Password [email or web address] 10/10/2014, 2:27 AM

## 2014-10-11 DIAGNOSIS — A419 Sepsis, unspecified organism: Secondary | ICD-10-CM | POA: Insufficient documentation

## 2014-10-11 DIAGNOSIS — F17200 Nicotine dependence, unspecified, uncomplicated: Secondary | ICD-10-CM

## 2014-10-11 LAB — BASIC METABOLIC PANEL
Anion gap: 5 (ref 5–15)
BUN: 12 mg/dL (ref 6–20)
CO2: 23 mmol/L (ref 22–32)
CREATININE: 1.22 mg/dL — AB (ref 0.44–1.00)
Calcium: 8.6 mg/dL — ABNORMAL LOW (ref 8.9–10.3)
Chloride: 112 mmol/L — ABNORMAL HIGH (ref 101–111)
GFR, EST AFRICAN AMERICAN: 58 mL/min — AB (ref >60)
GFR, EST NON AFRICAN AMERICAN: 50 mL/min — AB (ref >60)
Glucose, Bld: 114 mg/dL — ABNORMAL HIGH (ref 65–99)
Potassium: 4.2 mmol/L (ref 3.5–5.1)
SODIUM: 140 mmol/L (ref 135–145)

## 2014-10-11 LAB — CBC WITH DIFFERENTIAL/PLATELET
BASOS PCT: 0 %
Basophils Absolute: 0 10*3/uL (ref 0.0–0.1)
EOS ABS: 0.1 10*3/uL (ref 0.0–0.7)
EOS PCT: 1 %
HCT: 32.5 % — ABNORMAL LOW (ref 36.0–46.0)
Hemoglobin: 10.6 g/dL — ABNORMAL LOW (ref 12.0–15.0)
LYMPHS ABS: 4 10*3/uL (ref 0.7–4.0)
Lymphocytes Relative: 51 %
MCH: 29.4 pg (ref 26.0–34.0)
MCHC: 32.6 g/dL (ref 30.0–36.0)
MCV: 90.3 fL (ref 78.0–100.0)
MONOS PCT: 7 %
Monocytes Absolute: 0.5 10*3/uL (ref 0.1–1.0)
NEUTROS PCT: 41 %
Neutro Abs: 3.2 10*3/uL (ref 1.7–7.7)
PLATELETS: 198 10*3/uL (ref 150–400)
RBC: 3.6 MIL/uL — AB (ref 3.87–5.11)
RDW: 15.6 % — ABNORMAL HIGH (ref 11.5–15.5)
WBC: 7.8 10*3/uL (ref 4.0–10.5)

## 2014-10-11 LAB — MAGNESIUM: MAGNESIUM: 2 mg/dL (ref 1.7–2.4)

## 2014-10-11 LAB — URINE CULTURE: SPECIAL REQUESTS: NORMAL

## 2014-10-11 MED ORDER — TRAZODONE HCL 50 MG PO TABS
50.0000 mg | ORAL_TABLET | Freq: Every day | ORAL | Status: DC
  Administered 2014-10-11 – 2014-10-12 (×2): 50 mg via ORAL
  Filled 2014-10-11 (×2): qty 1

## 2014-10-11 NOTE — Progress Notes (Signed)
PROGRESS NOTE  Crystal Stafford AST:[id number] DOB: Apr 13, 1960 DOA: 10/09/2014 PCP: Angelica Chessman, MD   Brief history 54 year old female with a history of depression/anxiety, hypertension, GERD, tobacco use, impaired glucose tolerance presented with one-day history of generalized weakness, myalgias, and suprapubic abdominal pain. The patient denied any nausea, vomiting, diarrhea, dysuria, hematuria, hematochezia, melena. She denied any synovitis or unusual rashes. She has subjective fevers and chills, but when she took her temperature, it was 98.23F. The patient denies any headaches or visual disturbance. Evaluation revealed lactic acid 2.40 with WBC 24.9. As a result, the patient was worked up for possible infectious source. Blood cultures and urine cultures were obtained, the patient was started on intravenous antibiotics. Assessment/Plan: Sepsis -secondary to bacteremia -10/10/2014 blood cultures--gram variable rod -Broad-spectrum antibiotics pending final susceptibilites -d/c ceftriaxone -continue vanco and zosyn -Follow-up blood cultures -Chest x-ray negative for any infiltrates or edema -Urinalysis without any significant pyuria -PCT 3.30 -Lactic acid 2.40--> 0.88 -Check HIV -Continue IV fluids  Tobacco abuse -Tobacco cessation discussed Impaired glucose tolerance -09/04/2014 hemoglobin A1c 6.1 Atypical chest pain -No symptoms of angina -EKG negative for concerning ischemic changes -complete reproducible on exam  Family Communication: Daughter updated at beside Disposition Plan: Home when medically stable    Procedures/Studies: Dg Chest 2 View  10/09/2014   CLINICAL DATA:  Severe body aches and shortness of breath since yesterday.  EXAM: CHEST  2 VIEW  COMPARISON:  October 25, 2012  FINDINGS: The heart size and mediastinal contours are within normal limits. Both lungs are clear. There is scoliosis of spine.  IMPRESSION: No active cardiopulmonary disease.    Electronically Signed   By: Abelardo Diesel M.D.   On: 10/09/2014 19:14   Ct Abdomen Pelvis W Contrast  10/10/2014   CLINICAL DATA:  Left lower quadrant pain and tenderness. Generalized body aches and vomiting for 1 day.  EXAM: CT ABDOMEN AND PELVIS WITH CONTRAST  TECHNIQUE: Multidetector CT imaging of the abdomen and pelvis was performed using the standard protocol following bolus administration of intravenous contrast.  CONTRAST:  144mL OMNIPAQUE IOHEXOL 300 MG/ML  SOLN  COMPARISON:  None.  FINDINGS: Lower chest:  The included lung bases are clear.  Liver: No focal lesion.  Normal in size.  Question mild steatosis.  Hepatobiliary: Gallbladder physiologically distended. No calcified gallstone. No biliary dilatation.  Pancreas: Normal.  No ductal dilatation or surrounding inflammation.  Spleen: Normal.  Adrenal glands: No nodule.  Kidneys: Symmetric renal enhancement and excretion. No hydronephrosis. No localizing renal abnormality.  Stomach/Bowel: Mild distal esophageal wall thickening. Stomach physiologically distended. There are no dilated or thickened small bowel loops. Small volume of stool throughout the colon without colonic wall thickening. The sigmoid colon is tortuous. No significant diverticular disease. The appendix is normal.  Vascular/Lymphatic: No retroperitoneal adenopathy. Abdominal aorta is normal in caliber. Mild atherosclerosis of the distal abdominal aorta and its branches.  Reproductive: The uterus is surgically absent. The ovaries are not discretely visualized.  Bladder: Decompressed and not well evaluated. Questionable perivesicular inflammatory change.  Other: No free air, free fluid, or intra-abdominal fluid collection.  Musculoskeletal: There are no acute or suspicious osseous abnormalities.  IMPRESSION: 1. Questionable perivesicular inflammatory change, may reflect urinary tract infection. Recommend correlation with urinalysis. 2. There is otherwise no acute abnormality.   Electronically  Signed   By: Jeb Levering M.D.   On: 10/10/2014 00:07   Dg Chest Port 1 View  10/10/2014   CLINICAL DATA:  Cough. Shortness of breath. Sepsis. Systemic inflammatory response syndrome.  EXAM: PORTABLE CHEST 1 VIEW  COMPARISON:  10/09/2014  FINDINGS: The heart size and mediastinal contours are within normal limits. Both lungs are clear. The visualized skeletal structures are unremarkable.  IMPRESSION: No active disease.   Electronically Signed   By: Earle Gell M.D.   On: 10/10/2014 07:15         Subjective: Patient denies fevers, chills, headache, chest pain, dyspnea, nausea, vomiting, diarrhea, abdominal pain, dysuria, hematuria   Objective: Filed Vitals:   10/10/14 2047 10/11/14 0522 10/11/14 0906 10/11/14 1300  BP: 122/64 102/60 121/73 120/67  Pulse: 80 71 75 72  Temp: 98.4 F (36.9 C) 98.2 F (36.8 C) 98.2 F (36.8 C) 98 F (36.7 C)  TempSrc: Oral Oral Oral Oral  Resp: 18 18 18 28   Height:      Weight:  78.9 kg (173 lb 15.1 oz)    SpO2: 98% 98% 100% 100%    Intake/Output Summary (Last 24 hours) at 10/11/14 1438 Last data filed at 10/11/14 0600  Gross per 24 hour  Intake   3730 ml  Output    200 ml  Net   3530 ml   Weight change: 1.335 kg (2 lb 15.1 oz) Exam:   General:  Pt is alert, follows commands appropriately, not in acute distress  HEENT: No icterus, No thrush, No neck mass, [place]/AT  Cardiovascular: RRR, S1/S2, no rubs, no gallops  Respiratory: CTA bilaterally, no wheezing, no crackles, no rhonchi  Abdomen: Soft/+BS, non tender, non distended, no guarding  Extremities: No edema, No lymphangitis, No petechiae, No rashes, no synovitis  Data Reviewed: Basic Metabolic Panel:  Recent Labs Lab 10/09/14 1830 10/10/14 0351 10/11/14 0432  NA 138 141 140  K 4.2 3.7 4.2  CL 105 110 112*  CO2 23 24 23   GLUCOSE 141* 151* 114*  BUN 9 10 12   CREATININE 1.26* 1.16* 1.22*  CALCIUM 9.5 8.6* 8.6*  MG  --   --  2.0   Liver Function Tests:  Recent  Labs Lab 10/09/14 2302 10/10/14 0351  AST 30 24  ALT 12* 11*  ALKPHOS 91 86  BILITOT 0.3 0.4  PROT 6.9 6.1*  ALBUMIN 3.6 3.3*    Recent Labs Lab 10/09/14 2023  LIPASE 22   No results for input(s): AMMONIA in the last 168 hours. CBC:  Recent Labs Lab 10/09/14 1830 10/09/14 2302 10/10/14 0351 10/11/14 0432  WBC 20.2* 24.9* 24.7* 7.8  NEUTROABS  --  21.4* 18.7* 3.2  HGB 13.1 11.9* 11.7* 10.6*  HCT 40.1 36.6 35.4* 32.5*  MCV 90.5 89.3 89.8 90.3  PLT 246 227 220 198   Cardiac Enzymes:  Recent Labs Lab 10/10/14 0133  CKTOTAL 98   BNP: Invalid input(s): POCBNP CBG:  Recent Labs Lab 10/10/14 1128 10/10/14 1642  GLUCAP 108* 105*    Recent Results (from the past 240 hour(s))  Rapid strep screen (not at [facility])     Status: None   Collection Time: 10/09/14 10:05 PM  Result Value Ref Range Status   Streptococcus, Group A Screen (Direct) NEGATIVE NEGATIVE Final    Comment: (NOTE) A Rapid Antigen test may result negative if the antigen level in the sample is below the detection level of this test. The FDA has not cleared this test as a stand-alone test therefore the rapid antigen negative result has reflexed to a Group A Strep culture.   Blood culture (routine x 2)     Status:  None (Preliminary result)   Collection Time: 10/10/14  1:34 AM  Result Value Ref Range Status   Specimen Description BLOOD LEFT HAND  Final   Special Requests BOTTLES DRAWN AEROBIC AND ANAEROBIC 5CC  Final   Culture  Setup Time   Final    GRAM VARIABLE ROD ANAEROBIC BOTTLE ONLY CRITICAL RESULT CALLED TO, READ BACK BY AND VERIFIED WITH: D THOMPSON RN [id number] 10/10/14 A BROWNING    Culture CULTURE REINCUBATED FOR BETTER GROWTH  Final   Report Status PENDING  Incomplete  MRSA PCR Screening     Status: Abnormal   Collection Time: 10/10/14  2:27 AM  Result Value Ref Range Status   MRSA by PCR POSITIVE (A) NEGATIVE Final    Comment:        The GeneXpert MRSA Assay (FDA approved for NASAL  specimens only), is one component of a comprehensive MRSA colonization surveillance program. It is not intended to diagnose MRSA infection nor to guide or monitor treatment for MRSA infections. RESULT CALLED TO, READ BACK BY AND VERIFIED WITH: MIKE FUTRELL @0515  10/10/14 MKELLY   Urine culture     Status: None   Collection Time: 10/10/14  2:35 AM  Result Value Ref Range Status   Specimen Description URINE, RANDOM  Final   Special Requests Normal  Final   Culture MULTIPLE SPECIES PRESENT, SUGGEST RECOLLECTION  Final   Report Status 10/11/2014 FINAL  Final     Scheduled Meds: . busPIRone  10 mg Oral Daily  . Chlorhexidine Gluconate Cloth  6 each Topical Q0600  . enoxaparin (LOVENOX) injection  40 mg Subcutaneous Daily  . Influenza vac split quadrivalent PF  0.5 mL Intramuscular Tomorrow-1000  . insulin aspart  0-9 Units Subcutaneous TID WC  . mupirocin ointment  1 application Nasal BID  . pantoprazole  40 mg Oral Daily  . piperacillin-tazobactam (ZOSYN)  IV  3.375 g Intravenous 3 times per day  . vancomycin  750 mg Intravenous Q12H   Continuous Infusions:    Crystal Shetterly, DO  [facility] Pager [phone]  If 7PM-7AM, please contact night-coverage [email or web address] Password [email or web address] 10/11/2014, 2:38 PM   LOS: 1 day

## 2014-10-12 LAB — BASIC METABOLIC PANEL
ANION GAP: 7 (ref 5–15)
BUN: 11 mg/dL (ref 6–20)
CO2: 22 mmol/L (ref 22–32)
Calcium: 8.9 mg/dL (ref 8.9–10.3)
Chloride: 113 mmol/L — ABNORMAL HIGH (ref 101–111)
Creatinine, Ser: 1.19 mg/dL — ABNORMAL HIGH (ref 0.44–1.00)
GFR, EST AFRICAN AMERICAN: 60 mL/min — AB (ref >60)
GFR, EST NON AFRICAN AMERICAN: 52 mL/min — AB (ref >60)
Glucose, Bld: 111 mg/dL — ABNORMAL HIGH (ref 65–99)
Potassium: 4.2 mmol/L (ref 3.5–5.1)
SODIUM: 142 mmol/L (ref 135–145)

## 2014-10-12 LAB — CULTURE, BLOOD (ROUTINE X 2)

## 2014-10-12 LAB — CULTURE, GROUP A STREP: Strep A Culture: NEGATIVE

## 2014-10-12 LAB — HIV ANTIBODY (ROUTINE TESTING W REFLEX): HIV SCREEN 4TH GENERATION: NONREACTIVE

## 2014-10-12 MED ORDER — SODIUM CHLORIDE 0.9 % IJ SOLN
10.0000 mL | INTRAMUSCULAR | Status: DC | PRN
  Administered 2014-10-13: 10 mL
  Filled 2014-10-12: qty 40

## 2014-10-12 NOTE — Progress Notes (Signed)
Peripherally Inserted Central Catheter/Midline Placement  The IV Nurse has discussed with the patient and/or persons authorized to consent for the patient, the purpose of this procedure and the potential benefits and risks involved with this procedure.  The benefits include less needle sticks, lab draws from the catheter and patient may be discharged home with the catheter.  Risks include, but not limited to, infection, bleeding, blood clot (thrombus formation), and puncture of an artery; nerve damage and irregular heat beat.  Alternatives to this procedure were also discussed.  PICC/Midline Placement Documentation  PICC / Midline Single Lumen 93/23/55 PICC Right Basilic 40 cm 0 cm (Active)  Indication for Insertion or Continuance of Line Home intravenous therapies (PICC only) 10/12/2014  4:05 PM  Exposed Catheter (cm) 0 cm 10/12/2014  4:05 PM  Site Assessment Clean;Dry;Intact 10/12/2014  4:05 PM  Line Status Flushed;Saline locked;Blood return noted 10/12/2014  4:05 PM  Dressing Type Transparent 10/12/2014  4:05 PM  Dressing Status Clean;Dry;Intact;Antimicrobial disc in place 10/12/2014  4:05 PM  Line Care Connections checked and tightened 10/12/2014  4:05 PM  Line Adjustment (NICU/IV Team Only) No 10/12/2014  4:05 PM  Dressing Intervention New dressing 10/12/2014  4:05 PM  Dressing Change Due 10/19/14 10/12/2014  4:05 PM       Rolena Infante 10/12/2014, 4:07 PM

## 2014-10-12 NOTE — Progress Notes (Signed)
PROGRESS NOTE  Crystal Stafford PTW:[id number] DOB: 1960/11/04 DOA: 10/09/2014 PCP: Angelica Chessman, MD    Brief history 54 year old female with a history of depression/anxiety, hypertension, GERD, tobacco use, impaired glucose tolerance presented with one-day history of generalized weakness, myalgias, and suprapubic abdominal pain. The patient denied any nausea, vomiting, diarrhea, dysuria, hematuria, hematochezia, melena. She denied any synovitis or unusual rashes. She has subjective fevers and chills, but when she took her temperature, it was 98.57F. The patient denies any headaches or visual disturbance. Evaluation revealed lactic acid 2.40 with WBC 24.9. As a result, the patient was worked up for possible infectious source. Blood cultures and urine cultures were obtained, the patient was started on intravenous antibiotics. Assessment/Plan: Sepsis -secondary to bacteremia -10/10/2014 blood cultures--Bacillus sp. -d/c ceftriaxone and zosyn -continue vanco -although Bacillus can be part of skin flora, the patient presented with septic picture and workup has not revealed another clear source of infection -Pt also improved significantly with IV abx; as a result, will tx Bacillus as true bacteremia -place PICC line and plan 14 days of IV vancomycin -Chest x-ray negative for any infiltrates or edema -Urinalysis without any significant pyuria -PCT 3.30 -Lactic acid 2.40--> 0.88 -Check HIV--pending -Continue IV fluids  Tobacco abuse -Tobacco cessation discussed Impaired glucose tolerance -09/04/2014 hemoglobin A1c 6.1 Atypical chest pain -No symptoms of angina -EKG negative for concerning ischemic changes -complete reproducible on exam  Family Communication: spouse updated at beside Disposition Plan: Home 10/13/14 if stable   Procedures/Studies: Dg Chest 2 View  10/09/2014   CLINICAL DATA:  Severe body aches and shortness of breath since yesterday.  EXAM: CHEST  2  VIEW  COMPARISON:  October 25, 2012  FINDINGS: The heart size and mediastinal contours are within normal limits. Both lungs are clear. There is scoliosis of spine.  IMPRESSION: No active cardiopulmonary disease.   Electronically Signed   By: Abelardo Diesel M.D.   On: 10/09/2014 19:14   Ct Abdomen Pelvis W Contrast  10/10/2014   CLINICAL DATA:  Left lower quadrant pain and tenderness. Generalized body aches and vomiting for 1 day.  EXAM: CT ABDOMEN AND PELVIS WITH CONTRAST  TECHNIQUE: Multidetector CT imaging of the abdomen and pelvis was performed using the standard protocol following bolus administration of intravenous contrast.  CONTRAST:  159mL OMNIPAQUE IOHEXOL 300 MG/ML  SOLN  COMPARISON:  None.  FINDINGS: Lower chest:  The included lung bases are clear.  Liver: No focal lesion.  Normal in size.  Question mild steatosis.  Hepatobiliary: Gallbladder physiologically distended. No calcified gallstone. No biliary dilatation.  Pancreas: Normal.  No ductal dilatation or surrounding inflammation.  Spleen: Normal.  Adrenal glands: No nodule.  Kidneys: Symmetric renal enhancement and excretion. No hydronephrosis. No localizing renal abnormality.  Stomach/Bowel: Mild distal esophageal wall thickening. Stomach physiologically distended. There are no dilated or thickened small bowel loops. Small volume of stool throughout the colon without colonic wall thickening. The sigmoid colon is tortuous. No significant diverticular disease. The appendix is normal.  Vascular/Lymphatic: No retroperitoneal adenopathy. Abdominal aorta is normal in caliber. Mild atherosclerosis of the distal abdominal aorta and its branches.  Reproductive: The uterus is surgically absent. The ovaries are not discretely visualized.  Bladder: Decompressed and not well evaluated. Questionable perivesicular inflammatory change.  Other: No free air, free fluid, or intra-abdominal fluid collection.  Musculoskeletal: There are no acute or suspicious osseous  abnormalities.  IMPRESSION: 1. Questionable perivesicular inflammatory change, may reflect urinary tract  infection. Recommend correlation with urinalysis. 2. There is otherwise no acute abnormality.   Electronically Signed   By: Jeb Levering M.D.   On: 10/10/2014 00:07   Dg Chest Port 1 View  10/10/2014   CLINICAL DATA:  Cough. Shortness of breath. Sepsis. Systemic inflammatory response syndrome.  EXAM: PORTABLE CHEST 1 VIEW  COMPARISON:  10/09/2014  FINDINGS: The heart size and mediastinal contours are within normal limits. Both lungs are clear. The visualized skeletal structures are unremarkable.  IMPRESSION: No active disease.   Electronically Signed   By: Earle Gell M.D.   On: 10/10/2014 07:15         Subjective: Patient denies fevers, chills, headache, chest pain, dyspnea, nausea, vomiting, diarrhea, abdominal pain, dysuria, hematuria   Objective: Filed Vitals:   10/11/14 1300 10/11/14 2154 10/12/14 0532 10/12/14 1418  BP: 120/67 133/79 122/63 119/65  Pulse: 72 87 58 73  Temp: 98 F (36.7 C) 98.4 F (36.9 C) 98.4 F (36.9 C) 97.7 F (36.5 C)  TempSrc: Oral Oral Oral Oral  Resp: 28 20 20 20   Height:      Weight:   78.79 kg (173 lb 11.2 oz)   SpO2: 100% 99% 96% 100%    Intake/Output Summary (Last 24 hours) at 10/12/14 1641 Last data filed at 10/12/14 0600  Gross per 24 hour  Intake    980 ml  Output    300 ml  Net    680 ml   Weight change: -0.11 kg (-3.9 oz) Exam:   General:  Pt is alert, follows commands appropriately, not in acute distress  HEENT: No icterus, No thrush, No neck mass, [place]/AT  Cardiovascular: RRR, S1/S2, no rubs, no gallops  Respiratory: CTA bilaterally, no wheezing, no crackles, no rhonchi  Abdomen: Soft/+BS, non tender, non distended, no guarding  Extremities: No edema, No lymphangitis, No petechiae, No rashes, no synovitis  Data Reviewed: Basic Metabolic Panel:  Recent Labs Lab 10/09/14 1830 10/10/14 0351 10/11/14 0432  10/12/14 0320  NA 138 141 140 142  K 4.2 3.7 4.2 4.2  CL 105 110 112* 113*  CO2 23 24 23 22   GLUCOSE 141* 151* 114* 111*  BUN 9 10 12 11   CREATININE 1.26* 1.16* 1.22* 1.19*  CALCIUM 9.5 8.6* 8.6* 8.9  MG  --   --  2.0  --    Liver Function Tests:  Recent Labs Lab 10/09/14 2302 10/10/14 0351  AST 30 24  ALT 12* 11*  ALKPHOS 91 86  BILITOT 0.3 0.4  PROT 6.9 6.1*  ALBUMIN 3.6 3.3*    Recent Labs Lab 10/09/14 2023  LIPASE 22   No results for input(s): AMMONIA in the last 168 hours. CBC:  Recent Labs Lab 10/09/14 1830 10/09/14 2302 10/10/14 0351 10/11/14 0432  WBC 20.2* 24.9* 24.7* 7.8  NEUTROABS  --  21.4* 18.7* 3.2  HGB 13.1 11.9* 11.7* 10.6*  HCT 40.1 36.6 35.4* 32.5*  MCV 90.5 89.3 89.8 90.3  PLT 246 227 220 198   Cardiac Enzymes:  Recent Labs Lab 10/10/14 0133  CKTOTAL 98   BNP: Invalid input(s): POCBNP CBG:  Recent Labs Lab 10/10/14 1128 10/10/14 1642  GLUCAP 108* 105*    Recent Results (from the past 240 hour(s))  Rapid strep screen (not at [facility])     Status: None   Collection Time: 10/09/14 10:05 PM  Result Value Ref Range Status   Streptococcus, Group A Screen (Direct) NEGATIVE NEGATIVE Final    Comment: (NOTE) A Rapid Antigen test  may result negative if the antigen level in the sample is below the detection level of this test. The FDA has not cleared this test as a stand-alone test therefore the rapid antigen negative result has reflexed to a Group A Strep culture.   Blood culture (routine x 2)     Status: None (Preliminary result)   Collection Time: 10/10/14  1:20 AM  Result Value Ref Range Status   Specimen Description BLOOD LEFT ARM  Final   Special Requests BOTTLES DRAWN AEROBIC AND ANAEROBIC 5CC  Final   Culture NO GROWTH 1 DAY  Final   Report Status PENDING  Incomplete  Blood culture (routine x 2)     Status: None   Collection Time: 10/10/14  1:34 AM  Result Value Ref Range Status   Specimen Description BLOOD LEFT HAND   Final   Special Requests BOTTLES DRAWN AEROBIC AND ANAEROBIC 5CC  Final   Culture  Setup Time   Final    GRAM VARIABLE ROD ANAEROBIC BOTTLE ONLY CRITICAL RESULT CALLED TO, READ BACK BY AND VERIFIED WITH: D THOMPSON RN [id number] 10/10/14 A BROWNING    Culture   Final    BACILLUS SPECIES Standardized susceptibility testing for this organism is not available.    Report Status 10/12/2014 FINAL  Final  MRSA PCR Screening     Status: Abnormal   Collection Time: 10/10/14  2:27 AM  Result Value Ref Range Status   MRSA by PCR POSITIVE (A) NEGATIVE Final    Comment:        The GeneXpert MRSA Assay (FDA approved for NASAL specimens only), is one component of a comprehensive MRSA colonization surveillance program. It is not intended to diagnose MRSA infection nor to guide or monitor treatment for MRSA infections. RESULT CALLED TO, READ BACK BY AND VERIFIED WITH: MIKE FUTRELL @0515  10/10/14 MKELLY   Urine culture     Status: None   Collection Time: 10/10/14  2:35 AM  Result Value Ref Range Status   Specimen Description URINE, RANDOM  Final   Special Requests Normal  Final   Culture MULTIPLE SPECIES PRESENT, SUGGEST RECOLLECTION  Final   Report Status 10/11/2014 FINAL  Final     Scheduled Meds: . busPIRone  10 mg Oral Daily  . Chlorhexidine Gluconate Cloth  6 each Topical Q0600  . enoxaparin (LOVENOX) injection  40 mg Subcutaneous Daily  . Influenza vac split quadrivalent PF  0.5 mL Intramuscular Tomorrow-1000  . insulin aspart  0-9 Units Subcutaneous TID WC  . mupirocin ointment  1 application Nasal BID  . pantoprazole  40 mg Oral Daily  . traZODone  50 mg Oral QHS  . vancomycin  750 mg Intravenous Q12H   Continuous Infusions:    Jyoti Harju, DO  [facility] Pager [phone]  If 7PM-7AM, please contact night-coverage [email or web address] Password [email or web address] 10/12/2014, 4:41 PM   LOS: 2 days

## 2014-10-13 LAB — CBC
HEMATOCRIT: 34.4 % — AB (ref 36.0–46.0)
HEMOGLOBIN: 11.1 g/dL — AB (ref 12.0–15.0)
MCH: 28.7 pg (ref 26.0–34.0)
MCHC: 32.3 g/dL (ref 30.0–36.0)
MCV: 88.9 fL (ref 78.0–100.0)
Platelets: 236 10*3/uL (ref 150–400)
RBC: 3.87 MIL/uL (ref 3.87–5.11)
RDW: 15.5 % (ref 11.5–15.5)
WBC: 9.5 10*3/uL (ref 4.0–10.5)

## 2014-10-13 LAB — HEMOGLOBIN A1C
Hgb A1c MFr Bld: 6.2 % — ABNORMAL HIGH (ref 4.8–5.6)
MEAN PLASMA GLUCOSE: 131 mg/dL

## 2014-10-13 MED ORDER — VANCOMYCIN HCL IN DEXTROSE 750-5 MG/150ML-% IV SOLN: 750.0000 mg | Freq: Two times a day (BID) | INTRAVENOUS | Status: DC

## 2014-10-13 MED ORDER — HEPARIN SOD (PORK) LOCK FLUSH 100 UNIT/ML IV SOLN
250.0000 [IU] | Freq: Every day | INTRAVENOUS | Status: DC
  Filled 2014-10-13: qty 3

## 2014-10-13 MED ORDER — HEPARIN SOD (PORK) LOCK FLUSH 100 UNIT/ML IV SOLN
250.0000 [IU] | INTRAVENOUS | Status: AC | PRN
  Administered 2014-10-13: 250 [IU]

## 2014-10-13 MED ORDER — HEPARIN SOD (PORK) LOCK FLUSH 100 UNIT/ML IV SOLN
250.0000 [IU] | INTRAVENOUS | Status: DC | PRN
  Administered 2014-10-13: 250 [IU]
  Filled 2014-10-13: qty 3

## 2014-10-13 NOTE — Discharge Summary (Signed)
Physician Discharge Summary  Crystal Stafford:[id number] DOB: 15-Apr-1960 DOA: 10/09/2014  PCP: Angelica Chessman, MD  Admit date: 10/09/2014 Discharge date: 10/13/2014  Recommendations for Outpatient Follow-up:  1. Pt will need to follow up with PCP in 2 weeks post discharge 2. Please obtain BMP and CBC 10/20/2014 3. Please check vancomycin trough before a.m. dose 10/14/2014 4. Continue vancomycin through 10/23/2014 5. Discontinue PICC line after last dose of antibiotics 10/23/2014   Discharge Diagnoses:  Sepsis -secondary to bacteremia -10/10/2014 blood cultures--Bacillus sp. -d/c ceftriaxone and zosyn -continue vanco -although Bacillus can be part of skin flora, the patient presented with septic picture and workup has not revealed another clear source of infection -Pt also improved significantly with IV abx; as a result, will tx Bacillus as true bacteremia -patient presented with a septic-like picture with improvement on intravenous antibiotics. In addition, the patient gives a clinical history that she has been working with soil and potting plants in the days prior to admission -place PICC line and plan 14 days of IV vancomycin--plan 11 more days after discharge through 10/23/2014 -Chest x-ray negative for any infiltrates or edema -Urinalysis without any significant pyuria -PCT 3.30 -Lactic acid 2.40--> 0.88 -Check HIV--neg -Continued IV fluids  Tobacco abuse -Tobacco cessation discussed Impaired glucose tolerance -09/04/2014 hemoglobin A1c 6.1 -repeat HbA1c--pending at time of d/c Atypical chest pain -No symptoms of angina -EKG negative for concerning ischemic changes -complete reproducible on exam  Discharge Condition: stable  Disposition:  Follow-up Information    Follow up with [facility].   Why:  They will assist you with your home IV antibiotics   Contact information:   [place] [address] [phone]      home  Diet:heart healthy Wt Readings from Last 3 Encounters:  10/13/14 78.608 kg (173 lb 4.8 oz)  09/04/14 79.833 kg (176 lb)  05/08/14 83.008 kg (183 lb)    History of present illness:  54 year old female with a history of depression/anxiety, hypertension, GERD, tobacco use, impaired glucose tolerance presented with one-day history of generalized weakness, myalgias, and suprapubic abdominal pain. The patient denied any nausea, vomiting, diarrhea, dysuria, hematuria, hematochezia, melena. She denied any synovitis or unusual rashes. She has subjective fevers and chills, but when she took her temperature, it was 98.37F. The patient denies any headaches or visual disturbance. Evaluation revealed lactic acid 2.40 with WBC 24.9. As a result, the patient was worked up for possible infectious source. Blood cultures and urine cultures were obtained, the patient was started on intravenous antibiotics.  Blood cultures grew Bacillus. Although this is an organism that lives in soil and in the skin and may represent a contaminant, the patient presented with a septic-like picture with improvement on intravenous antibiotics. In addition, the patient gives a clinical history that she has been working with soil and potting plants in the days prior to admission. As a result, the patient will continue on intravenous vancomycin through 10/23/2014    Discharge Exam: Filed Vitals:   10/13/14 0623  BP: 118/64  Pulse: 83  Temp: 98.4 F (36.9 C)  Resp: 20   Filed Vitals:   10/12/14 0532 10/12/14 1418 10/12/14 2151 10/13/14 0623  BP: 122/63 119/65 113/79 118/64  Pulse: 58 73 73 83  Temp: 98.4 F (36.9 C) 97.7 F (36.5 C) 98.1 F (36.7 C) 98.4 F (36.9 C)  TempSrc: Oral Oral Oral Oral  Resp: 20 20 22 20   Height:      Weight: 78.79 kg (173  lb 11.2 oz)   78.608 kg (173 lb 4.8 oz)  SpO2: 96% 100% 100% 99%   General: A&O x 3, NAD, pleasant, cooperative Cardiovascular: RRR, no rub, no gallop, no  S3 Respiratory: CTAB, no wheeze, no rhonchi Abdomen:soft, nontender, nondistended, positive bowel sounds Extremities: No edema, No lymphangitis, no petechiae  Discharge Instructions      Discharge Instructions    Diet - low sodium heart healthy    Complete by:  As directed      Increase activity slowly    Complete by:  As directed             Medication List    STOP taking these medications        acetaminophen-codeine 300-30 MG tablet  Commonly known as:  TYLENOL #3     amoxicillin 500 MG capsule  Commonly known as:  AMOXIL     clarithromycin 500 MG tablet  Commonly known as:  BIAXIN     sucralfate 1 GM/10ML suspension  Commonly known as:  CARAFATE     WEIGHT LOSS DAILY MULTI PO      TAKE these medications        busPIRone 10 MG tablet  Commonly known as:  BUSPAR  Take 10 mg by mouth daily.     cyclobenzaprine 10 MG tablet  Commonly known as:  FLEXERIL  Take 1 tablet (10 mg total) by mouth 2 (two) times daily as needed for muscle spasms.     EPIPEN IJ  Inject 1 each as directed once as needed (for allergic reaction).     esomeprazole 40 MG capsule  Commonly known as:  NEXIUM  Take 1 capsule (40 mg total) by mouth daily.     gabapentin 100 MG capsule  Commonly known as:  NEURONTIN  Take 1 capsule (100 mg total) by mouth 3 (three) times daily.     Vancomycin 750 MG/150ML Soln  Commonly known as:  VANCOCIN  Inject 150 mLs (750 mg total) into the vein every 12 (twelve) hours. Through 10/23/14.         The results of significant diagnostics from this hospitalization (including imaging, microbiology, ancillary and laboratory) are listed below for reference.    Significant Diagnostic Studies: Dg Chest 2 View  10/09/2014   CLINICAL DATA:  Severe body aches and shortness of breath since yesterday.  EXAM: CHEST  2 VIEW  COMPARISON:  October 25, 2012  FINDINGS: The heart size and mediastinal contours are within normal limits. Both lungs are clear. There is  scoliosis of spine.  IMPRESSION: No active cardiopulmonary disease.   Electronically Signed   By: Abelardo Diesel M.D.   On: 10/09/2014 19:14   Ct Abdomen Pelvis W Contrast  10/10/2014   CLINICAL DATA:  Left lower quadrant pain and tenderness. Generalized body aches and vomiting for 1 day.  EXAM: CT ABDOMEN AND PELVIS WITH CONTRAST  TECHNIQUE: Multidetector CT imaging of the abdomen and pelvis was performed using the standard protocol following bolus administration of intravenous contrast.  CONTRAST:  1103mL OMNIPAQUE IOHEXOL 300 MG/ML  SOLN  COMPARISON:  None.  FINDINGS: Lower chest:  The included lung bases are clear.  Liver: No focal lesion.  Normal in size.  Question mild steatosis.  Hepatobiliary: Gallbladder physiologically distended. No calcified gallstone. No biliary dilatation.  Pancreas: Normal.  No ductal dilatation or surrounding inflammation.  Spleen: Normal.  Adrenal glands: No nodule.  Kidneys: Symmetric renal enhancement and excretion. No hydronephrosis. No localizing renal abnormality.  Stomach/Bowel: Mild  distal esophageal wall thickening. Stomach physiologically distended. There are no dilated or thickened small bowel loops. Small volume of stool throughout the colon without colonic wall thickening. The sigmoid colon is tortuous. No significant diverticular disease. The appendix is normal.  Vascular/Lymphatic: No retroperitoneal adenopathy. Abdominal aorta is normal in caliber. Mild atherosclerosis of the distal abdominal aorta and its branches.  Reproductive: The uterus is surgically absent. The ovaries are not discretely visualized.  Bladder: Decompressed and not well evaluated. Questionable perivesicular inflammatory change.  Other: No free air, free fluid, or intra-abdominal fluid collection.  Musculoskeletal: There are no acute or suspicious osseous abnormalities.  IMPRESSION: 1. Questionable perivesicular inflammatory change, may reflect urinary tract infection. Recommend correlation with  urinalysis. 2. There is otherwise no acute abnormality.   Electronically Signed   By: Jeb Levering M.D.   On: 10/10/2014 00:07   Dg Chest Port 1 View  10/10/2014   CLINICAL DATA:  Cough. Shortness of breath. Sepsis. Systemic inflammatory response syndrome.  EXAM: PORTABLE CHEST 1 VIEW  COMPARISON:  10/09/2014  FINDINGS: The heart size and mediastinal contours are within normal limits. Both lungs are clear. The visualized skeletal structures are unremarkable.  IMPRESSION: No active disease.   Electronically Signed   By: Earle Gell M.D.   On: 10/10/2014 07:15     Microbiology: Recent Results (from the past 240 hour(s))  Rapid strep screen (not at [facility])     Status: None   Collection Time: 10/09/14 10:05 PM  Result Value Ref Range Status   Streptococcus, Group A Screen (Direct) NEGATIVE NEGATIVE Final    Comment: (NOTE) A Rapid Antigen test may result negative if the antigen level in the sample is below the detection level of this test. The FDA has not cleared this test as a stand-alone test therefore the rapid antigen negative result has reflexed to a Group A Strep culture.   Culture, Group A Strep     Status: None   Collection Time: 10/09/14 10:05 PM  Result Value Ref Range Status   Strep A Culture Negative  Final    Comment: (NOTE) Performed At: [facility] [place], [place] [id number] Lindon Romp MD UV:[id number]   Blood culture (routine x 2)     Status: None (Preliminary result)   Collection Time: 10/10/14  1:20 AM  Result Value Ref Range Status   Specimen Description BLOOD LEFT ARM  Final   Special Requests BOTTLES DRAWN AEROBIC AND ANAEROBIC 5CC  Final   Culture NO GROWTH 2 DAYS  Final   Report Status PENDING  Incomplete  Blood culture (routine x 2)     Status: None   Collection Time: 10/10/14  1:34 AM  Result Value Ref Range Status   Specimen Description BLOOD LEFT HAND  Final   Special Requests BOTTLES DRAWN AEROBIC AND ANAEROBIC 5CC  Final    Culture  Setup Time   Final    GRAM VARIABLE ROD ANAEROBIC BOTTLE ONLY CRITICAL RESULT CALLED TO, READ BACK BY AND VERIFIED WITH: D THOMPSON RN [id number] 10/10/14 A BROWNING    Culture   Final    BACILLUS SPECIES Standardized susceptibility testing for this organism is not available.    Report Status 10/12/2014 FINAL  Final  MRSA PCR Screening     Status: Abnormal   Collection Time: 10/10/14  2:27 AM  Result Value Ref Range Status   MRSA by PCR POSITIVE (A) NEGATIVE Final    Comment:        The  GeneXpert MRSA Assay (FDA approved for NASAL specimens only), is one component of a comprehensive MRSA colonization surveillance program. It is not intended to diagnose MRSA infection nor to guide or monitor treatment for MRSA infections. RESULT CALLED TO, READ BACK BY AND VERIFIED WITH: MIKE FUTRELL @0515  10/10/14 MKELLY   Urine culture     Status: None   Collection Time: 10/10/14  2:35 AM  Result Value Ref Range Status   Specimen Description URINE, RANDOM  Final   Special Requests Normal  Final   Culture MULTIPLE SPECIES PRESENT, SUGGEST RECOLLECTION  Final   Report Status 10/11/2014 FINAL  Final     Labs: Basic Metabolic Panel:  Recent Labs Lab 10/09/14 1830 10/10/14 0351 10/11/14 0432 10/12/14 0320  NA 138 141 140 142  K 4.2 3.7 4.2 4.2  CL 105 110 112* 113*  CO2 23 24 23 22   GLUCOSE 141* 151* 114* 111*  BUN 9 10 12 11   CREATININE 1.26* 1.16* 1.22* 1.19*  CALCIUM 9.5 8.6* 8.6* 8.9  MG  --   --  2.0  --    Liver Function Tests:  Recent Labs Lab 10/09/14 2302 10/10/14 0351  AST 30 24  ALT 12* 11*  ALKPHOS 91 86  BILITOT 0.3 0.4  PROT 6.9 6.1*  ALBUMIN 3.6 3.3*    Recent Labs Lab 10/09/14 2023  LIPASE 22   No results for input(s): AMMONIA in the last 168 hours. CBC:  Recent Labs Lab 10/09/14 1830 10/09/14 2302 10/10/14 0351 10/11/14 0432 10/13/14 0325  WBC 20.2* 24.9* 24.7* 7.8 9.5  NEUTROABS  --  21.4* 18.7* 3.2  --   HGB 13.1 11.9* 11.7*  10.6* 11.1*  HCT 40.1 36.6 35.4* 32.5* 34.4*  MCV 90.5 89.3 89.8 90.3 88.9  PLT 246 227 220 198 236   Cardiac Enzymes:  Recent Labs Lab 10/10/14 0133  CKTOTAL 98   BNP: Invalid input(s): POCBNP CBG:  Recent Labs Lab 10/10/14 1128 10/10/14 1642  GLUCAP 108* 105*    Time coordinating discharge:  Greater than 30 minutes  Signed:  Brylei Pedley, DO [facility] Pager: [phone] 10/13/2014, 9:43 AM

## 2014-10-13 NOTE — Care Management Note (Signed)
Case Management Note  Patient Details  Name: ADAH STONEBERG MRN: [id number] Date of Birth: 08/07/1960  Subjective/Objective:       Admitted with Sepsis, bacteremia             Action/Plan: Patient is independent of ADL's, will go home on IV antibiotics. HHC choice offered, patient chose [facility]. Carolynn Sayers RN with [facility] called for arrangements and will see patient today and complete arrangements for home IV antibiotics.  Expected Discharge Date:      10/13/2014            Expected Discharge Plan:  [place]  Discharge planning Services  CM Consult  Choice offered to:  Patient  HH Arranged:  RN [facility] Agency:  [place]  Status of Service:  In process, will continue to follow  Sherrilyn Rist [phone] 10/13/2014, 9:38 AM

## 2014-10-13 NOTE — Progress Notes (Signed)
PICC maintenance performed via IV team;  IV fluids discontinued; already non-telemetry pt.  Explained discharge instructions to pt and she had no further questions at this time.  Home Health will provide IV maintenance and fluid instructions.  Pt is in no acute distress and is ready for discharge to home.

## 2014-10-14 DIAGNOSIS — D72829 Elevated white blood cell count, unspecified: Secondary | ICD-10-CM | POA: Diagnosis not present

## 2014-10-14 DIAGNOSIS — F17210 Nicotine dependence, cigarettes, uncomplicated: Secondary | ICD-10-CM | POA: Diagnosis not present

## 2014-10-14 DIAGNOSIS — F329 Major depressive disorder, single episode, unspecified: Secondary | ICD-10-CM | POA: Diagnosis not present

## 2014-10-14 DIAGNOSIS — A419 Sepsis, unspecified organism: Secondary | ICD-10-CM | POA: Diagnosis not present

## 2014-10-14 DIAGNOSIS — F419 Anxiety disorder, unspecified: Secondary | ICD-10-CM | POA: Diagnosis not present

## 2014-10-14 DIAGNOSIS — Z452 Encounter for adjustment and management of vascular access device: Secondary | ICD-10-CM | POA: Diagnosis not present

## 2014-10-14 DIAGNOSIS — Z8614 Personal history of Methicillin resistant Staphylococcus aureus infection: Secondary | ICD-10-CM | POA: Diagnosis not present

## 2014-10-14 DIAGNOSIS — J45909 Unspecified asthma, uncomplicated: Secondary | ICD-10-CM | POA: Diagnosis not present

## 2014-10-14 DIAGNOSIS — Z5181 Encounter for therapeutic drug level monitoring: Secondary | ICD-10-CM | POA: Diagnosis not present

## 2014-10-14 DIAGNOSIS — I10 Essential (primary) hypertension: Secondary | ICD-10-CM | POA: Diagnosis not present

## 2014-10-15 LAB — CULTURE, BLOOD (ROUTINE X 2): CULTURE: NO GROWTH

## 2014-10-16 DIAGNOSIS — I10 Essential (primary) hypertension: Secondary | ICD-10-CM | POA: Diagnosis not present

## 2014-10-16 DIAGNOSIS — A419 Sepsis, unspecified organism: Secondary | ICD-10-CM | POA: Diagnosis not present

## 2014-10-16 DIAGNOSIS — F17210 Nicotine dependence, cigarettes, uncomplicated: Secondary | ICD-10-CM | POA: Diagnosis not present

## 2014-10-16 DIAGNOSIS — Z8614 Personal history of Methicillin resistant Staphylococcus aureus infection: Secondary | ICD-10-CM | POA: Diagnosis not present

## 2014-10-16 DIAGNOSIS — Z5181 Encounter for therapeutic drug level monitoring: Secondary | ICD-10-CM | POA: Diagnosis not present

## 2014-10-16 DIAGNOSIS — J45909 Unspecified asthma, uncomplicated: Secondary | ICD-10-CM | POA: Diagnosis not present

## 2014-10-16 DIAGNOSIS — F329 Major depressive disorder, single episode, unspecified: Secondary | ICD-10-CM | POA: Diagnosis not present

## 2014-10-16 DIAGNOSIS — F419 Anxiety disorder, unspecified: Secondary | ICD-10-CM | POA: Diagnosis not present

## 2014-10-16 DIAGNOSIS — Z452 Encounter for adjustment and management of vascular access device: Secondary | ICD-10-CM | POA: Diagnosis not present

## 2014-10-18 DIAGNOSIS — A419 Sepsis, unspecified organism: Secondary | ICD-10-CM | POA: Diagnosis not present

## 2014-10-18 DIAGNOSIS — D72829 Elevated white blood cell count, unspecified: Secondary | ICD-10-CM | POA: Diagnosis not present

## 2014-10-21 DIAGNOSIS — F419 Anxiety disorder, unspecified: Secondary | ICD-10-CM | POA: Diagnosis not present

## 2014-10-21 DIAGNOSIS — F17210 Nicotine dependence, cigarettes, uncomplicated: Secondary | ICD-10-CM | POA: Diagnosis not present

## 2014-10-21 DIAGNOSIS — I5043 Acute on chronic combined systolic (congestive) and diastolic (congestive) heart failure: Secondary | ICD-10-CM | POA: Diagnosis not present

## 2014-10-21 DIAGNOSIS — Z5181 Encounter for therapeutic drug level monitoring: Secondary | ICD-10-CM | POA: Diagnosis not present

## 2014-10-21 DIAGNOSIS — F329 Major depressive disorder, single episode, unspecified: Secondary | ICD-10-CM | POA: Diagnosis not present

## 2014-10-21 DIAGNOSIS — I6789 Other cerebrovascular disease: Secondary | ICD-10-CM | POA: Diagnosis not present

## 2014-10-21 DIAGNOSIS — A419 Sepsis, unspecified organism: Secondary | ICD-10-CM | POA: Diagnosis not present

## 2014-10-21 DIAGNOSIS — J45909 Unspecified asthma, uncomplicated: Secondary | ICD-10-CM | POA: Diagnosis not present

## 2014-10-21 DIAGNOSIS — Z8614 Personal history of Methicillin resistant Staphylococcus aureus infection: Secondary | ICD-10-CM | POA: Diagnosis not present

## 2014-10-21 DIAGNOSIS — Z452 Encounter for adjustment and management of vascular access device: Secondary | ICD-10-CM | POA: Diagnosis not present

## 2014-10-21 DIAGNOSIS — R202 Paresthesia of skin: Secondary | ICD-10-CM | POA: Diagnosis not present

## 2014-10-21 DIAGNOSIS — I10 Essential (primary) hypertension: Secondary | ICD-10-CM | POA: Diagnosis not present

## 2014-10-22 ENCOUNTER — Emergency Department (HOSPITAL_COMMUNITY)
Admission: EM | Admit: 2014-10-22 | Discharge: 2014-10-22 | Disposition: A | Discharge status: Home / Self Care | Payer: Commercial Managed Care - HMO | Attending: Emergency Medicine | Admitting: Emergency Medicine

## 2014-10-22 ENCOUNTER — Telehealth: Payer: Self-pay

## 2014-10-22 ENCOUNTER — Encounter (HOSPITAL_COMMUNITY): Payer: Self-pay

## 2014-10-22 ENCOUNTER — Emergency Department (HOSPITAL_COMMUNITY): Payer: Commercial Managed Care - HMO

## 2014-10-22 DIAGNOSIS — I10 Essential (primary) hypertension: Secondary | ICD-10-CM | POA: Diagnosis not present

## 2014-10-22 DIAGNOSIS — K219 Gastro-esophageal reflux disease without esophagitis: Secondary | ICD-10-CM | POA: Insufficient documentation

## 2014-10-22 DIAGNOSIS — R200 Anesthesia of skin: Secondary | ICD-10-CM | POA: Diagnosis not present

## 2014-10-22 DIAGNOSIS — J45909 Unspecified asthma, uncomplicated: Secondary | ICD-10-CM | POA: Diagnosis not present

## 2014-10-22 DIAGNOSIS — F329 Major depressive disorder, single episode, unspecified: Secondary | ICD-10-CM | POA: Diagnosis not present

## 2014-10-22 DIAGNOSIS — Z79899 Other long term (current) drug therapy: Secondary | ICD-10-CM | POA: Insufficient documentation

## 2014-10-22 DIAGNOSIS — X58XXXA Exposure to other specified factors, initial encounter: Secondary | ICD-10-CM | POA: Diagnosis not present

## 2014-10-22 DIAGNOSIS — Z720 Tobacco use: Secondary | ICD-10-CM | POA: Insufficient documentation

## 2014-10-22 DIAGNOSIS — R202 Paresthesia of skin: Secondary | ICD-10-CM | POA: Diagnosis not present

## 2014-10-22 DIAGNOSIS — Y9389 Activity, other specified: Secondary | ICD-10-CM | POA: Insufficient documentation

## 2014-10-22 DIAGNOSIS — Y9289 Other specified places as the place of occurrence of the external cause: Secondary | ICD-10-CM | POA: Diagnosis not present

## 2014-10-22 DIAGNOSIS — Z8614 Personal history of Methicillin resistant Staphylococcus aureus infection: Secondary | ICD-10-CM | POA: Diagnosis not present

## 2014-10-22 DIAGNOSIS — M364 Arthropathy in hypersensitivity reactions classified elsewhere: Secondary | ICD-10-CM | POA: Diagnosis present

## 2014-10-22 DIAGNOSIS — S301XXA Contusion of abdominal wall, initial encounter: Secondary | ICD-10-CM | POA: Insufficient documentation

## 2014-10-22 DIAGNOSIS — Z452 Encounter for adjustment and management of vascular access device: Secondary | ICD-10-CM | POA: Diagnosis not present

## 2014-10-22 DIAGNOSIS — Y998 Other external cause status: Secondary | ICD-10-CM | POA: Insufficient documentation

## 2014-10-22 DIAGNOSIS — Z95828 Presence of other vascular implants and grafts: Secondary | ICD-10-CM

## 2014-10-22 LAB — CBC WITH DIFFERENTIAL/PLATELET
BASOS ABS: 0 10*3/uL (ref 0.0–0.1)
Basophils Relative: 0 %
EOS ABS: 0.2 10*3/uL (ref 0.0–0.7)
EOS PCT: 2 %
HCT: 39.5 % (ref 36.0–46.0)
Hemoglobin: 12.9 g/dL (ref 12.0–15.0)
Lymphocytes Relative: 22 %
Lymphs Abs: 2.3 10*3/uL (ref 0.7–4.0)
MCH: 29.1 pg (ref 26.0–34.0)
MCHC: 32.7 g/dL (ref 30.0–36.0)
MCV: 89.2 fL (ref 78.0–100.0)
Monocytes Absolute: 0.8 10*3/uL (ref 0.1–1.0)
Monocytes Relative: 8 %
Neutro Abs: 7.2 10*3/uL (ref 1.7–7.7)
Neutrophils Relative %: 68 %
PLATELETS: 284 10*3/uL (ref 150–400)
RBC: 4.43 MIL/uL (ref 3.87–5.11)
RDW: 15.2 % (ref 11.5–15.5)
WBC: 10.5 10*3/uL (ref 4.0–10.5)

## 2014-10-22 LAB — BASIC METABOLIC PANEL
Anion gap: 13 (ref 5–15)
BUN: 10 mg/dL (ref 6–20)
CO2: 21 mmol/L — ABNORMAL LOW (ref 22–32)
CREATININE: 1.05 mg/dL — AB (ref 0.44–1.00)
Calcium: 9.4 mg/dL (ref 8.9–10.3)
Chloride: 106 mmol/L (ref 101–111)
GFR calc Af Amer: >60 mL/min (ref >60)
GFR, EST NON AFRICAN AMERICAN: 60 mL/min — AB (ref >60)
Glucose, Bld: 108 mg/dL — ABNORMAL HIGH (ref 65–99)
POTASSIUM: 3.9 mmol/L (ref 3.5–5.1)
SODIUM: 140 mmol/L (ref 135–145)

## 2014-10-22 MED ORDER — HEPARIN SOD (PORK) LOCK FLUSH 100 UNIT/ML IV SOLN
500.0000 [IU] | Freq: Once | INTRAVENOUS | Status: AC
  Administered 2014-10-22: 500 [IU]
  Filled 2014-10-22: qty 5

## 2014-10-22 MED ORDER — SODIUM CHLORIDE 0.9 % IJ SOLN: 10.0000 mL | INTRAMUSCULAR | Status: DC | PRN

## 2014-10-22 MED ORDER — DIPHENHYDRAMINE HCL 25 MG PO CAPS: 25.0000 mg | ORAL_CAPSULE | Freq: Four times a day (QID) | ORAL | Status: DC | PRN

## 2014-10-22 MED ORDER — DIPHENHYDRAMINE HCL 50 MG/ML IJ SOLN
25.0000 mg | Freq: Once | INTRAMUSCULAR | Status: AC
  Administered 2014-10-22: 25 mg via INTRAVENOUS

## 2014-10-22 MED ORDER — DIPHENHYDRAMINE HCL 50 MG/ML IJ SOLN
INTRAMUSCULAR | Status: AC
  Filled 2014-10-22: qty 1

## 2014-10-22 MED ORDER — SODIUM CHLORIDE 0.9 % IJ SOLN
10.0000 mL | Freq: Two times a day (BID) | INTRAMUSCULAR | Status: DC
  Administered 2014-10-22: 10 mL via INTRAVENOUS

## 2014-10-22 NOTE — Discharge Instructions (Signed)
We saw you in the ER for All the results in the ER are normal, labs and imaging. We are not sure what is causing your symptoms. We are glad to see you gettjng better during the ER observation time. The workup in the ER is not complete, and is limited to screening for life threatening and emergent conditions only, so please see a primary care doctor for further evaluation.   Paresthesia Paresthesia is an abnormal burning or prickling sensation. This sensation is generally felt in the hands, arms, legs, or feet. However, it may occur in any part of the body. Usually, it is not painful. The feeling may be described as:  Tingling or numbness.  Pins and needles.  Skin crawling.  Buzzing.  Limbs falling asleep.  Itching. Most people experience temporary (transient) paresthesia at some time in their lives. Paresthesia may occur when you breathe too quickly (hyperventilation). It can also occur without any apparent cause. Commonly, paresthesia occurs when pressure is placed on a nerve. The sensation quickly goes away after the pressure is removed. For some people, however, paresthesia is a long-lasting (chronic) condition that is caused by an underlying disorder. If you continue to have paresthesia, you may need further medical evaluation. HOME CARE INSTRUCTIONS Watch your condition for any changes. Taking the following actions may help to lessen any discomfort that you are feeling:  Avoid drinking alcohol.  Try acupuncture or massage to help relieve your symptoms.  Keep all follow-up visits as directed by your health care provider. This is important. SEEK MEDICAL CARE IF:  You continue to have episodes of paresthesia.  Your burning or prickling feeling gets worse when you walk.  You have pain, cramps, or dizziness.  You develop a rash. SEEK IMMEDIATE MEDICAL CARE IF:  You feel weak.  You have trouble walking or moving.  You have problems with speech, understanding, or  vision.  You feel confused.  You cannot control your bladder or bowel movements.  You have numbness after an injury.  You faint.   This information is not intended to replace advice given to you by your health care provider. Make sure you discuss any questions you have with your health care provider.   Document Released: 12/17/2001 Document Revised: 05/13/2014 Document Reviewed: 12/23/2013 Elsevier Interactive Patient Education [employer].

## 2014-10-22 NOTE — ED Notes (Signed)
Discharge instructions and prescription reviewed - voiced understanding.  

## 2014-10-22 NOTE — ED Notes (Signed)
Ambulated patient in the hallway.. Patient was doing fine.Marland Kitchen

## 2014-10-22 NOTE — Telephone Encounter (Signed)
This Case Manager received phone call from Carolynn Sayers, Home Infusion Coordinator with [facility]. Per Pam, patient on home IV vancomycin. Patient having side effects (numbness and an "electricity feeling" in her body) with vancomycin. Patient went to ED on 10/22/14 for evaluation. [facility] has placed vancomycin on hold. Pam indicates antibiotic scheduled to be completed on 10/23/14. She indicated order needed from Dr. Doreene Burke to remove PICC if he is agreeable. Patient has appointment scheduled with Dr. Doreene Burke on 10/23/14 at 1700.  Message routed to Dr. Doreene Burke.

## 2014-10-22 NOTE — Telephone Encounter (Signed)
Addendum-Spoke with Dr. Doreene Burke about patient. He indicated IV vancomycin could be stopped, and he would remove patient's PICC at her appointment on 10/23/14. He indicated he would place patient on oral antibiotics at her appointment on 10/23/14. Dr. Doreene Burke spoke with patient and her appointment was rescheduled for 10/23/14 at 0930. Call placed to [facility], Home Infusion Coordinator with [place], to provide update; unable to reach. Voicemail left requesting return call.

## 2014-10-22 NOTE — ED Notes (Signed)
Per EMS, pt from home. Pt has PICC line on right side. Pt does vancomycin treatments at home. Pt was recently released for infection/sepsis. Pt states that after her treatment this evening she didn't flush with heparin, pt began to feel tingling in lower extremities with fatigue and hypersensitivity. Pt has had this reaction once before after treatment. Pt is uncertain if she is administering medication correctly. Pt has never had a reaction to this medication before. Pt passed stroke screen with ems. Pt had intermittent difficulty getting her words out. Grips equal on both sides and no facial droop. Pt appears to be somewhat lethargic at times. EMS VS....BP 129/92, Pulse 92, 96% on RA, RR 16, CBG 107.

## 2014-10-22 NOTE — ED Provider Notes (Signed)
CSN: [id number]     Arrival date & time 10/22/14  0008 History   By signing my name below, I, Forrestine Him, attest that this documentation has been prepared under the direction and in the presence of Varney Biles, MD. Electronically Signed: Forrestine Him, ED Scribe. 10/22/2014. 3:02 AM.   Chief Complaint  Patient presents with  . Hypersensitivity Reaction   The history is provided by the patient. No language interpreter was used.    HPI Comments: PAULETT KAUFHOLD brought in by EMS is a 54 y.o. female with a PMHx of HTN who presents to the Emergency Department here after a "hypersensitive reaction" this evening. Pt is complaining of constant, ongoing weakness onset 11:15 PM this evening. Pt states "it felt like electricity was going through my whole body". States she could not move her legs and called her neighbor to call 911 after she could not get off of the ground. Symptoms came on this evening after flushing her PICC line with a syringe of fluid following her Vacomycin dosage. She admits to a previous episode several weeks ago after flushing following a Heparin dosage. However, she has not had similar symptoms after several of the other episodes of vanc, or saline or heparin.  Ongoing L sided back pain also reported. Denies any history of MS, no midline spine tenderness. Ms. Boisselle is scheduled to follow with he doctor on 10/13 to discuss possibly stopping antibiotic. Pt with known allergy to Doxycycline.  Past Medical History  Diagnosis Date  . Depression   . MRSA (methicillin resistant Staphylococcus aureus)   . Hypertension   . GERD (gastroesophageal reflux disease)   . Asthma    Past Surgical History  Procedure Laterality Date  . Abdominal hysterectomy     Family History  Problem Relation Age of Onset  . Colon cancer Maternal Grandfather 32  . Breast cancer Maternal Aunt    Social History  Substance Use Topics  . Smoking status: Current Every Day Smoker -- 1.00 packs/day   Types: Cigarettes    Last Attempt to Quit: 11/27/2012  . Smokeless tobacco: Never Used  . Alcohol Use: No   OB History    No data available     Review of Systems  Constitutional: Negative for fever and chills.  Respiratory: Negative for cough and shortness of breath.   Cardiovascular: Negative for chest pain.  Gastrointestinal: Negative for nausea, vomiting, abdominal pain and diarrhea.  Musculoskeletal: Negative for back pain.  Skin: Negative for rash.  Neurological: Positive for weakness. Negative for headaches.  Psychiatric/Behavioral: Negative for confusion.  All other systems reviewed and are negative.     Allergies  Doxycycline  Home Medications   Prior to Admission medications   Medication Sig Start Date End Date Taking? Authorizing Provider  busPIRone (BUSPAR) 10 MG tablet Take 10 mg by mouth daily.    Yes Historical Provider, MD  cyclobenzaprine (FLEXERIL) 10 MG tablet Take 1 tablet (10 mg total) by mouth 2 (two) times daily as needed for muscle spasms. 05/08/14  Yes Tresa Garter, MD  EPINEPHrine (EPIPEN IJ) Inject 1 each as directed once as needed (for allergic reaction).   Yes Historical Provider, MD  esomeprazole (NEXIUM) 40 MG capsule Take 1 capsule (40 mg total) by mouth daily. Patient taking differently: Take 40 mg by mouth daily as needed (acid reflux, heartburn).  05/08/14  Yes Tresa Garter, MD  gabapentin (NEURONTIN) 100 MG capsule Take 1 capsule (100 mg total) by mouth 3 (three) times daily.  Patient taking differently: Take 100 mg by mouth 3 (three) times daily as needed (nerve pain).  05/08/14  Yes Tresa Garter, MD  Vancomycin (VANCOCIN) 750 MG/150ML SOLN Inject 150 mLs (750 mg total) into the vein every 12 (twelve) hours. Through 10/23/14. 10/13/14  Yes Orson Eva, MD  diphenhydrAMINE (BENADRYL) 25 mg capsule Take 1 capsule (25 mg total) by mouth every 6 (six) hours as needed for itching. 10/22/14   Varney Biles, MD   Triage Vitals: BP  127/85 mmHg  Pulse 92  Temp(Src) 97.4 F (36.3 C)  Resp 18  SpO2 97%   Physical Exam  Constitutional: She is oriented to person, place, and time. She appears well-developed and well-nourished. No distress.  HENT:  Head: Normocephalic and atraumatic.  Eyes: EOM are normal.  Neck: Normal range of motion.  Cardiovascular: Normal rate, regular rhythm and normal heart sounds.   Pulmonary/Chest: Effort normal and breath sounds normal. No stridor.  Lungs clear to auscultation  Abdominal: Soft. She exhibits no distension. There is no tenderness.  Genitourinary:  L sided flank tenderness with no ecchymosis   Musculoskeletal: Normal range of motion.  PICC site has mild crusting but no erythema, urticarial type lesions No midline T spine, L spine, or C spine tenderness  Neurological: She is alert and oriented to person, place, and time.  4/5 strength to upper and lower extremities with grossly normal and equal sensory exam  Skin: Skin is warm and dry.  Psychiatric: She has a normal mood and affect. Judgment normal.  Nursing note and vitals reviewed.   ED Course  Procedures (including critical care time)  DIAGNOSTIC STUDIES: Oxygen Saturation is 100% on RA, Normal by my interpretation.    COORDINATION OF CARE: 2:59 AM- Will give fluids, Benadryl and fluids. Will order BMP and CBC. Discussed treatment plan with pt at bedside and pt agreed to plan.     Labs Review Labs Reviewed  BASIC METABOLIC PANEL - Abnormal; Notable for the following:    CO2 21 (*)    Glucose, Bld 108 (*)    Creatinine, Ser 1.05 (*)    GFR calc non Af Amer 60 (*)    All other components within normal limits  CBC WITH DIFFERENTIAL/PLATELET    Imaging Review Dg Chest 1 View  10/22/2014  CLINICAL DATA:  PICC line placement. EXAM: CHEST 1 VIEW COMPARISON:  10/10/2014 FINDINGS: Interval placement of right PICC catheter with tip over the low SVC region. No pneumothorax. Normal heart size and pulmonary vascularity.  No focal airspace disease or consolidation in the lungs. No blunting of costophrenic angles. Mediastinal contours appear intact. IMPRESSION: Right PICC line tip over the low SVC region. No pneumothorax. No evidence of active pulmonary disease. Electronically Signed   By: Lucienne Capers M.D.   On: 10/22/2014 02:25   I have personally reviewed and evaluated these images and lab results as part of my medical decision-making.   EKG Interpretation None      MDM   Final diagnoses:  Numbness and tingling of leg    I personally performed the services described in this documentation, which was scribed in my presence. The recorded information has been reviewed and is accurate.   Pt with lower extremity weakness and numbness. On our exam - strength is 4+/5 bilaterally and gross sensory exam is normal. Lungs are normal, no rash. I am not convinced if this is hypersensitivity - given multiple rounds of meds with no similar symptoms. Pt observed in the ER for  extended period of time and she got back to baseline normal. She has ambulated. Still a mystery what happened, but i feel comfortable sending her home.   Varney Biles, MD 10/22/14 [phone]

## 2014-10-23 ENCOUNTER — Ambulatory Visit: Payer: Commercial Managed Care - HMO | Attending: Internal Medicine | Admitting: Internal Medicine

## 2014-10-23 ENCOUNTER — Encounter: Payer: Self-pay | Admitting: Internal Medicine

## 2014-10-23 VITALS — BP 130/88 | HR 96 | Temp 98.3°F | Resp 18 | Ht 64.0 in | Wt 170.2 lb

## 2014-10-23 DIAGNOSIS — Z452 Encounter for adjustment and management of vascular access device: Secondary | ICD-10-CM | POA: Insufficient documentation

## 2014-10-23 DIAGNOSIS — R7303 Prediabetes: Secondary | ICD-10-CM | POA: Diagnosis not present

## 2014-10-23 DIAGNOSIS — Z79899 Other long term (current) drug therapy: Secondary | ICD-10-CM | POA: Insufficient documentation

## 2014-10-23 DIAGNOSIS — Z09 Encounter for follow-up examination after completed treatment for conditions other than malignant neoplasm: Secondary | ICD-10-CM | POA: Diagnosis not present

## 2014-10-23 DIAGNOSIS — A499 Bacterial infection, unspecified: Secondary | ICD-10-CM | POA: Insufficient documentation

## 2014-10-23 DIAGNOSIS — Z0000 Encounter for general adult medical examination without abnormal findings: Secondary | ICD-10-CM | POA: Diagnosis not present

## 2014-10-23 DIAGNOSIS — I10 Essential (primary) hypertension: Secondary | ICD-10-CM | POA: Diagnosis not present

## 2014-10-23 DIAGNOSIS — J45909 Unspecified asthma, uncomplicated: Secondary | ICD-10-CM | POA: Diagnosis not present

## 2014-10-23 DIAGNOSIS — K219 Gastro-esophageal reflux disease without esophagitis: Secondary | ICD-10-CM | POA: Insufficient documentation

## 2014-10-23 DIAGNOSIS — Z5189 Encounter for other specified aftercare: Secondary | ICD-10-CM | POA: Diagnosis present

## 2014-10-23 MED ORDER — CLINDAMYCIN HCL 300 MG PO CAPS: 300.0000 mg | ORAL_CAPSULE | Freq: Three times a day (TID) | ORAL | Status: DC

## 2014-10-23 NOTE — Patient Instructions (Signed)
DASH Eating Plan °DASH stands for "Dietary Approaches to Stop Hypertension." The DASH eating plan is a healthy eating plan that has been shown to reduce high blood pressure (hypertension). Additional health benefits may include reducing the risk of type 2 diabetes mellitus, heart disease, and stroke. The DASH eating plan may also help with weight loss. °WHAT DO I NEED TO KNOW ABOUT THE DASH EATING PLAN? °For the DASH eating plan, you will follow these general guidelines: °· Choose foods with a percent daily value for sodium of less than 5% (as listed on the food label). °· Use salt-free seasonings or herbs instead of table salt or sea salt. °· Check with your health care provider or pharmacist before using salt substitutes. °· Eat lower-sodium products, often labeled as "lower sodium" or "no salt added." °· Eat fresh foods. °· Eat more vegetables, fruits, and low-fat dairy products. °· Choose whole grains. Look for the word "whole" as the first word in the ingredient list. °· Choose fish and skinless chicken or turkey more often than red meat. Limit fish, poultry, and meat to 6 oz (170 g) each day. °· Limit sweets, desserts, sugars, and sugary drinks. °· Choose heart-healthy fats. °· Limit cheese to 1 oz (28 g) per day. °· Eat more home-cooked food and less restaurant, buffet, and fast food. °· Limit fried foods. °· Cook foods using methods other than frying. °· Limit canned vegetables. If you do use them, rinse them well to decrease the sodium. °· When eating at a restaurant, ask that your food be prepared with less salt, or no salt if possible. °WHAT FOODS CAN I EAT? °Seek help from a dietitian for individual calorie needs. °Grains °Whole grain or whole wheat bread. Brown rice. Whole grain or whole wheat pasta. Quinoa, bulgur, and whole grain cereals. Low-sodium cereals. Corn or whole wheat flour tortillas. Whole grain cornbread. Whole grain crackers. Low-sodium crackers. °Vegetables °Fresh or frozen vegetables  (raw, steamed, roasted, or grilled). Low-sodium or reduced-sodium tomato and vegetable juices. Low-sodium or reduced-sodium tomato sauce and paste. Low-sodium or reduced-sodium canned vegetables.  °Fruits °All fresh, canned (in natural juice), or frozen fruits. °Meat and Other Protein Products °Ground beef (85% or leaner), grass-fed beef, or beef trimmed of fat. Skinless chicken or turkey. Ground chicken or turkey. Pork trimmed of fat. All fish and seafood. Eggs. Dried beans, peas, or lentils. Unsalted nuts and seeds. Unsalted canned beans. °Dairy °Low-fat dairy products, such as skim or 1% milk, 2% or reduced-fat cheeses, low-fat ricotta or cottage cheese, or plain low-fat yogurt. Low-sodium or reduced-sodium cheeses. °Fats and Oils °Tub margarines without trans fats. Light or reduced-fat mayonnaise and salad dressings (reduced sodium). Avocado. Safflower, olive, or canola oils. Natural peanut or almond butter. °Other °Unsalted popcorn and pretzels. °The items listed above may not be a complete list of recommended foods or beverages. Contact your dietitian for more options. °WHAT FOODS ARE NOT RECOMMENDED? °Grains °White bread. White pasta. White rice. Refined cornbread. Bagels and croissants. Crackers that contain trans fat. °Vegetables °Creamed or fried vegetables. Vegetables in a cheese sauce. Regular canned vegetables. Regular canned tomato sauce and paste. Regular tomato and vegetable juices. °Fruits °Dried fruits. Canned fruit in light or heavy syrup. Fruit juice. °Meat and Other Protein Products °Fatty cuts of meat. Ribs, chicken wings, bacon, sausage, bologna, salami, chitterlings, fatback, hot dogs, bratwurst, and packaged luncheon meats. Salted nuts and seeds. Canned beans with salt. °Dairy °Whole or 2% milk, cream, half-and-half, and cream cheese. Whole-fat or sweetened yogurt. Full-fat   cheeses or blue cheese. Nondairy creamers and whipped toppings. Processed cheese, cheese spreads, or cheese  curds. °Condiments °Onion and garlic salt, seasoned salt, table salt, and sea salt. Canned and packaged gravies. Worcestershire sauce. Tartar sauce. Barbecue sauce. Teriyaki sauce. Soy sauce, including reduced sodium. Steak sauce. Fish sauce. Oyster sauce. Cocktail sauce. Horseradish. Ketchup and mustard. Meat flavorings and tenderizers. Bouillon cubes. Hot sauce. Tabasco sauce. Marinades. Taco seasonings. Relishes. °Fats and Oils °Butter, stick margarine, lard, shortening, ghee, and bacon fat. Coconut, palm kernel, or palm oils. Regular salad dressings. °Other °Pickles and olives. Salted popcorn and pretzels. °The items listed above may not be a complete list of foods and beverages to avoid. Contact your dietitian for more information. °WHERE CAN I FIND MORE INFORMATION? °National [employer], [employer], [employer]: www.nhlbi.nih.gov/health/health-topics/topics/dash/ °  °This information is not intended to replace advice given to you by your health care provider. Make sure you discuss any questions you have with your health care provider. °  °Document Released: 12/16/2010 Document Revised: 01/17/2014 Document Reviewed: 10/31/2012 °Elsevier Interactive Patient Education ©2016 [employer] Inc[employer] ° °Hypertension °Hypertension, commonly called high blood pressure, is when the force of blood pumping through your arteries is too strong. Your arteries are the blood vessels that carry blood from your heart throughout your body. A blood pressure reading consists of a higher number over a lower number, such as 110/72. The higher number (systolic) is the pressure inside your arteries when your heart pumps. The lower number (diastolic) is the pressure inside your arteries when your heart relaxes. Ideally you want your blood pressure below 120/80. °Hypertension forces your heart to work harder to pump blood. Your arteries may become narrow or stiff. Having untreated or uncontrolled hypertension can cause heart attack, stroke, kidney  disease, and other problems. °RISK FACTORS °Some risk factors for high blood pressure are controllable. Others are not.  °Risk factors you cannot control include:  °· Race. You may be at higher risk if you are African American. °· Age. Risk increases with age. °· Gender. Men are at higher risk than women before age 45 years. After age 65, women are at higher risk than men. °Risk factors you can control include: °· Not getting enough exercise or physical activity. °· Being overweight. °· Getting too much fat, sugar, calories, or salt in your diet. °· Drinking too much alcohol. °SIGNS AND SYMPTOMS °Hypertension does not usually cause signs or symptoms. Extremely high blood pressure (hypertensive crisis) may cause headache, anxiety, shortness of breath, and nosebleed. °DIAGNOSIS °To check if you have hypertension, your health care provider will measure your blood pressure while you are seated, with your arm held at the level of your heart. It should be measured at least twice using the same arm. Certain conditions can cause a difference in blood pressure between your right and left arms. A blood pressure reading that is higher than normal on one occasion does not mean that you need treatment. If it is not clear whether you have high blood pressure, you may be asked to return on a different day to have your blood pressure checked again. Or, you may be asked to monitor your blood pressure at home for 1 or more weeks. °TREATMENT °Treating high blood pressure includes making lifestyle changes and possibly taking medicine. Living a healthy lifestyle can help lower high blood pressure. You may need to change some of your habits. °Lifestyle changes may include: °· Following the DASH diet. This diet is high in fruits, vegetables, and whole   grains. It is low in salt, red meat, and added sugars. °· Keep your sodium intake below 2,300 mg per day. °· Getting at least 30-45 minutes of aerobic exercise at least 4 times per  week. °· Losing weight if necessary. °· Not smoking. °· Limiting alcoholic beverages. °· Learning ways to reduce stress. °Your health care provider may prescribe medicine if lifestyle changes are not enough to get your blood pressure under control, and if one of the following is true: °· You are 18-59 years of age and your systolic blood pressure is above 140. °· You are 60 years of age or older, and your systolic blood pressure is above 150. °· Your diastolic blood pressure is above 90. °· You have diabetes, and your systolic blood pressure is over 140 or your diastolic blood pressure is over 90. °· You have kidney disease and your blood pressure is above 140/90. °· You have heart disease and your blood pressure is above 140/90. °Your personal target blood pressure may vary depending on your medical conditions, your age, and other factors. °HOME CARE INSTRUCTIONS °· Have your blood pressure rechecked as directed by your health care provider.   °· Take medicines only as directed by your health care provider. Follow the directions carefully. Blood pressure medicines must be taken as prescribed. The medicine does not work as well when you skip doses. Skipping doses also puts you at risk for problems. °· Do not smoke.   °· Monitor your blood pressure at home as directed by your health care provider.  °SEEK MEDICAL CARE IF:  °· You think you are having a reaction to medicines taken. °· You have recurrent headaches or feel dizzy. °· You have swelling in your ankles. °· You have trouble with your vision. °SEEK IMMEDIATE MEDICAL CARE IF: °· You develop a severe headache or confusion. °· You have unusual weakness, numbness, or feel faint. °· You have severe chest or abdominal pain. °· You vomit repeatedly. °· You have trouble breathing. °MAKE SURE YOU:  °· Understand these instructions. °· Will watch your condition. °· Will get help right away if you are not doing well or get worse. °  °This information is not intended to  replace advice given to you by your health care provider. Make sure you discuss any questions you have with your health care provider. °  °Document Released: 12/27/2004 Document Revised: 05/13/2014 Document Reviewed: 10/19/2012 °Elsevier Interactive Patient Education ©2016 [employer] ° °

## 2014-10-23 NOTE — Progress Notes (Signed)
Patient ID: HOLY BATTENFIELD, female   DOB: 03-Jun-1960, 54 y.o.   MRN: [id number]   Crystal Stafford, is a 54 y.o. female  [id number]  [id number]  DOB - 02/27/1960  Chief Complaint  Patient presents with  . Hospitalization Follow-up        Subjective:   Crystal Stafford is a 54 y.o. female here today for a follow up visit.  Patient has history of hypertension , prediabetes, major depression , GERD and asthma. She was recently seen and admitted to the  Hospital for SIRS and leukocytosis , blood culture grew by [place],  Patient was discharged on  IV vancomycin, PICC line in situ.  Patient did not complete 14 days as prescribed because of ?? Side effects, but she is feeling much better, no symptoms of ongoing infection.  Nausea, vomiting has stopped.She is here today for removal of PICC line. She has no new complaints.  Blood pressure is at goal. Her latest hemoglobin A1c was 6.2%. Patient has No headache, No chest pain, No abdominal pain - No Nausea, No new weakness tingling or numbness, No Cough - SOB.  Problem  Infection Due to [facility] Discharge Follow-Up  Picc (Peripherally Inserted Central Catheter) Removal    ALLERGIES: Allergies  Allergen Reactions  . Doxycycline Swelling and Rash    Face swelling    PAST MEDICAL HISTORY: Past Medical History  Diagnosis Date  . Depression   . MRSA (methicillin resistant Staphylococcus aureus)   . Hypertension   . GERD (gastroesophageal reflux disease)   . Asthma     MEDICATIONS AT HOME: Prior to Admission medications   Medication Sig Start Date End Date Taking? Authorizing Provider  busPIRone (BUSPAR) 10 MG tablet Take 10 mg by mouth daily.    Yes Historical Provider, MD  cyclobenzaprine (FLEXERIL) 10 MG tablet Take 1 tablet (10 mg total) by mouth 2 (two) times daily as needed for muscle spasms. 05/08/14  Yes Tresa Garter, MD  EPINEPHrine (EPIPEN IJ) Inject 1 each as directed once as needed (for  allergic reaction).   Yes Historical Provider, MD  esomeprazole (NEXIUM) 40 MG capsule Take 1 capsule (40 mg total) by mouth daily. Patient taking differently: Take 40 mg by mouth daily as needed (acid reflux, heartburn).  05/08/14  Yes Tresa Garter, MD  clindamycin (CLEOCIN) 300 MG capsule Take 1 capsule (300 mg total) by mouth 3 (three) times daily. 10/23/14   Tresa Garter, MD  diphenhydrAMINE (BENADRYL) 25 mg capsule Take 1 capsule (25 mg total) by mouth every 6 (six) hours as needed for itching. Patient not taking: Reported on 10/23/2014 10/22/14   Varney Biles, MD  gabapentin (NEURONTIN) 100 MG capsule Take 1 capsule (100 mg total) by mouth 3 (three) times daily. Patient not taking: Reported on 10/23/2014 05/08/14   Tresa Garter, MD  Vancomycin (VANCOCIN) 750 MG/150ML SOLN Inject 150 mLs (750 mg total) into the vein every 12 (twelve) hours. Through 10/23/14. Patient not taking: Reported on 10/23/2014 10/13/14   Orson Eva, MD     Objective:   Filed Vitals:   10/23/14 0946  BP: 130/88  Pulse: 96  Temp: 98.3 F (36.8 C)  TempSrc: Oral  Resp: 18  Height: 5\' 4"  (1.626 m)  Weight: 170 lb 3.2 oz (77.202 kg)  SpO2: 100%    Exam General appearance : Awake, alert, not in any distress. Speech Clear. Not toxic looking HEENT: Atraumatic and Normocephalic, pupils equally reactive to light and accomodation Neck:  supple, no JVD. No cervical lymphadenopathy.  Chest:Good air entry bilaterally, no added sounds  CVS: S1 S2 regular, no murmurs.  Abdomen: Bowel sounds present, Non tender and not distended with no gaurding, rigidity or rebound. Extremities: Right PICC line in situ.  B/L Lower Ext shows no edema, both legs are warm to touch Neurology: Awake alert, and oriented X 3, CN II-XII intact, Non focal  Data Review Lab Results  Component Value Date   HGBA1C 6.2* 10/12/2014   HGBA1C 6.10 09/04/2014   HGBA1C 6.20 05/08/2014     Assessment & Plan   1. Essential  hypertension  We have discussed target BP range and blood pressure goal. I have advised patient to check BP regularly and to call us back or report to clinic if the numbers are consistently higher than 140/90. We discussed the importance of compliance with medical therapy and DASH diet recommended, consequences of uncontrolled hypertension discussed.   - continue current BP medications  2. Infection due to Bacillus species  S/P IV Vancomycin for 10 days, did not complete 14 days as prescribed because of ?? Side effects   Complete 5 days of PO antibiotics - clindamycin (CLEOCIN) 300 MG capsule; Take 1 capsule (300 mg total) by mouth 3 (three) times daily.  Dispense: 15 capsule; Refill: 0  3. Hospital discharge follow-up  Follow up in 2 weeks for repeat CBC and BMP  4. PICC (peripherally inserted central catheter) removal  PICC line removed uneventfully.  Patient tolerated the procedure very well  Patient have been counseled extensively about nutrition and exercise  Return in about 2 weeks (around 11/06/2014) for For Blood Test: CBCD and BMP.  The patient was given clear instructions to go to ER or return to medical center if symptoms don't improve, worsen or new problems develop. The patient verbalized understanding. The patient was told to call to get lab results if they haven't heard anything in the next week.   This note has been created with Surveyor, quantity. Any transcriptional errors are unintentional.    Angelica Chessman, MD, [place], Karilyn Cota, [place] and [place], [place]   10/23/2014, 10:54 AM

## 2014-10-23 NOTE — Progress Notes (Signed)
Patient here for HFU, patient requesting pickline be removed.  Patient denies pain at this time. Patient complains of SOB with exertion. Patient complains of feeling fatigue

## 2014-10-24 DIAGNOSIS — F329 Major depressive disorder, single episode, unspecified: Secondary | ICD-10-CM | POA: Diagnosis not present

## 2014-10-24 DIAGNOSIS — F17210 Nicotine dependence, cigarettes, uncomplicated: Secondary | ICD-10-CM | POA: Diagnosis not present

## 2014-10-24 DIAGNOSIS — I10 Essential (primary) hypertension: Secondary | ICD-10-CM | POA: Diagnosis not present

## 2014-10-24 DIAGNOSIS — Z5181 Encounter for therapeutic drug level monitoring: Secondary | ICD-10-CM | POA: Diagnosis not present

## 2014-10-24 DIAGNOSIS — Z8614 Personal history of Methicillin resistant Staphylococcus aureus infection: Secondary | ICD-10-CM | POA: Diagnosis not present

## 2014-10-24 DIAGNOSIS — A419 Sepsis, unspecified organism: Secondary | ICD-10-CM | POA: Diagnosis not present

## 2014-10-24 DIAGNOSIS — F419 Anxiety disorder, unspecified: Secondary | ICD-10-CM | POA: Diagnosis not present

## 2014-10-24 DIAGNOSIS — J45909 Unspecified asthma, uncomplicated: Secondary | ICD-10-CM | POA: Diagnosis not present

## 2014-10-24 DIAGNOSIS — Z452 Encounter for adjustment and management of vascular access device: Secondary | ICD-10-CM | POA: Diagnosis not present

## 2014-10-27 DIAGNOSIS — J45909 Unspecified asthma, uncomplicated: Secondary | ICD-10-CM | POA: Diagnosis not present

## 2014-10-27 DIAGNOSIS — F329 Major depressive disorder, single episode, unspecified: Secondary | ICD-10-CM | POA: Diagnosis not present

## 2014-10-27 DIAGNOSIS — Z8614 Personal history of Methicillin resistant Staphylococcus aureus infection: Secondary | ICD-10-CM | POA: Diagnosis not present

## 2014-10-27 DIAGNOSIS — F419 Anxiety disorder, unspecified: Secondary | ICD-10-CM | POA: Diagnosis not present

## 2014-10-27 DIAGNOSIS — Z5181 Encounter for therapeutic drug level monitoring: Secondary | ICD-10-CM | POA: Diagnosis not present

## 2014-10-27 DIAGNOSIS — A419 Sepsis, unspecified organism: Secondary | ICD-10-CM | POA: Diagnosis not present

## 2014-10-27 DIAGNOSIS — F17210 Nicotine dependence, cigarettes, uncomplicated: Secondary | ICD-10-CM | POA: Diagnosis not present

## 2014-10-27 DIAGNOSIS — I10 Essential (primary) hypertension: Secondary | ICD-10-CM | POA: Diagnosis not present

## 2014-10-27 DIAGNOSIS — Z452 Encounter for adjustment and management of vascular access device: Secondary | ICD-10-CM | POA: Diagnosis not present

## 2014-11-06 ENCOUNTER — Ambulatory Visit: Payer: Commercial Managed Care - HMO | Attending: Internal Medicine

## 2014-11-06 DIAGNOSIS — Z0000 Encounter for general adult medical examination without abnormal findings: Secondary | ICD-10-CM

## 2014-11-06 LAB — BASIC METABOLIC PANEL
BUN: 13 mg/dL (ref 7–25)
CALCIUM: 9.4 mg/dL (ref 8.6–10.4)
CO2: 26 mmol/L (ref 20–31)
CREATININE: 1.14 mg/dL — AB (ref 0.50–1.05)
Chloride: 105 mmol/L (ref 98–110)
GLUCOSE: 95 mg/dL (ref 65–99)
Potassium: 4.3 mmol/L (ref 3.5–5.3)
Sodium: 140 mmol/L (ref 135–146)

## 2014-11-06 LAB — CBC WITH DIFFERENTIAL/PLATELET
BASOS PCT: 1 % (ref 0–1)
Basophils Absolute: 0.1 10*3/uL (ref 0.0–0.1)
EOS PCT: 4 % (ref 0–5)
Eosinophils Absolute: 0.3 10*3/uL (ref 0.0–0.7)
HEMATOCRIT: 39.4 % (ref 36.0–46.0)
HEMOGLOBIN: 12.8 g/dL (ref 12.0–15.0)
LYMPHS PCT: 45 % (ref 12–46)
Lymphs Abs: 3.9 10*3/uL (ref 0.7–4.0)
MCH: 29.2 pg (ref 26.0–34.0)
MCHC: 32.5 g/dL (ref 30.0–36.0)
MCV: 89.7 fL (ref 78.0–100.0)
MONO ABS: 0.7 10*3/uL (ref 0.1–1.0)
MONOS PCT: 8 % (ref 3–12)
MPV: 11.5 fL (ref 8.6–12.4)
NEUTROS ABS: 3.6 10*3/uL (ref 1.7–7.7)
Neutrophils Relative %: 42 % — ABNORMAL LOW (ref 43–77)
Platelets: 265 10*3/uL (ref 150–400)
RBC: 4.39 MIL/uL (ref 3.87–5.11)
RDW: 14.8 % (ref 11.5–15.5)
WBC: 8.6 10*3/uL (ref 4.0–10.5)

## 2014-11-14 ENCOUNTER — Encounter: Payer: Self-pay | Admitting: Internal Medicine

## 2014-11-14 ENCOUNTER — Telehealth: Payer: Self-pay | Admitting: *Deleted

## 2014-11-14 NOTE — Telephone Encounter (Signed)
-----   Message from Tresa Garter, MD sent at 11/12/2014  5:47 PM EDT ----- Please inform patient that her laboratory results shows slightly declined but stable kidney function, although results are normal. Continue blood pressure control, stay hydrated at all time. We will monitor the kidney function over time.

## 2014-11-14 NOTE — Telephone Encounter (Signed)
Patient verified DOB Patient informed of lab results showing slight decline but stable kidney function. Other results were normal. Patient advised to continue controlling blood pressure and stay hydrated. Patient advised of [place] monitoring her kidney function over time. Patient had no further questions and expressed her understanding.

## 2015-01-07 ENCOUNTER — Other Ambulatory Visit: Payer: Self-pay | Admitting: Internal Medicine

## 2015-04-10 LAB — GLUCOSE, POCT (MANUAL RESULT ENTRY): POC GLUCOSE: 91 mg/dL (ref 70–99)

## 2015-04-20 ENCOUNTER — Other Ambulatory Visit: Payer: Self-pay

## 2015-04-20 DIAGNOSIS — Z1231 Encounter for screening mammogram for malignant neoplasm of breast: Secondary | ICD-10-CM

## 2015-05-06 DIAGNOSIS — F4310 Post-traumatic stress disorder, unspecified: Secondary | ICD-10-CM | POA: Diagnosis not present

## 2015-05-07 ENCOUNTER — Ambulatory Visit
Admission: RE | Admit: 2015-05-07 | Discharge: 2015-05-07 | Disposition: A | Discharge status: Home / Self Care | Payer: Commercial Managed Care - HMO | Source: Ambulatory Visit

## 2015-05-07 DIAGNOSIS — Z1231 Encounter for screening mammogram for malignant neoplasm of breast: Secondary | ICD-10-CM | POA: Diagnosis not present

## 2015-05-12 DIAGNOSIS — F4310 Post-traumatic stress disorder, unspecified: Secondary | ICD-10-CM | POA: Diagnosis not present

## 2015-05-19 DIAGNOSIS — F4310 Post-traumatic stress disorder, unspecified: Secondary | ICD-10-CM | POA: Diagnosis not present

## 2015-05-26 DIAGNOSIS — F4310 Post-traumatic stress disorder, unspecified: Secondary | ICD-10-CM | POA: Diagnosis not present

## 2015-06-04 ENCOUNTER — Encounter: Payer: Self-pay | Admitting: Internal Medicine

## 2015-06-04 ENCOUNTER — Ambulatory Visit: Payer: Commercial Managed Care - HMO | Attending: Internal Medicine | Admitting: Internal Medicine

## 2015-06-04 VITALS — BP 108/76 | HR 85 | Temp 98.0°F | Resp 18 | Ht 64.0 in | Wt 174.6 lb

## 2015-06-04 DIAGNOSIS — I10 Essential (primary) hypertension: Secondary | ICD-10-CM

## 2015-06-04 DIAGNOSIS — R7303 Prediabetes: Secondary | ICD-10-CM

## 2015-06-04 DIAGNOSIS — R202 Paresthesia of skin: Secondary | ICD-10-CM

## 2015-06-04 DIAGNOSIS — R7309 Other abnormal glucose: Secondary | ICD-10-CM | POA: Diagnosis not present

## 2015-06-04 DIAGNOSIS — M5442 Lumbago with sciatica, left side: Secondary | ICD-10-CM | POA: Diagnosis not present

## 2015-06-04 DIAGNOSIS — E559 Vitamin D deficiency, unspecified: Secondary | ICD-10-CM | POA: Diagnosis not present

## 2015-06-04 DIAGNOSIS — R200 Anesthesia of skin: Secondary | ICD-10-CM | POA: Diagnosis not present

## 2015-06-04 DIAGNOSIS — R209 Unspecified disturbances of skin sensation: Secondary | ICD-10-CM | POA: Diagnosis not present

## 2015-06-04 DIAGNOSIS — Z79899 Other long term (current) drug therapy: Secondary | ICD-10-CM | POA: Diagnosis not present

## 2015-06-04 DIAGNOSIS — K219 Gastro-esophageal reflux disease without esophagitis: Secondary | ICD-10-CM | POA: Diagnosis not present

## 2015-06-04 DIAGNOSIS — F329 Major depressive disorder, single episode, unspecified: Secondary | ICD-10-CM | POA: Insufficient documentation

## 2015-06-04 DIAGNOSIS — J45909 Unspecified asthma, uncomplicated: Secondary | ICD-10-CM | POA: Diagnosis not present

## 2015-06-04 LAB — LIPID PANEL
CHOLESTEROL: 200 mg/dL (ref 125–200)
HDL: 30 mg/dL — AB (ref >=46)
LDL CALC: 125 mg/dL (ref <130)
TRIGLYCERIDES: 225 mg/dL — AB (ref <150)
Total CHOL/HDL Ratio: 6.7 Ratio — ABNORMAL HIGH (ref <=5.0)
VLDL: 45 mg/dL — ABNORMAL HIGH (ref <30)

## 2015-06-04 LAB — COMPLETE METABOLIC PANEL WITH GFR
ALK PHOS: 90 U/L (ref 33–130)
ALT: 14 U/L (ref 6–29)
AST: 19 U/L (ref 10–35)
Albumin: 4.2 g/dL (ref 3.6–5.1)
BILIRUBIN TOTAL: 0.2 mg/dL (ref 0.2–1.2)
BUN: 12 mg/dL (ref 7–25)
CALCIUM: 9.5 mg/dL (ref 8.6–10.4)
CO2: 22 mmol/L (ref 20–31)
CREATININE: 1 mg/dL (ref 0.50–1.05)
Chloride: 106 mmol/L (ref 98–110)
GFR, EST AFRICAN AMERICAN: 75 mL/min (ref >=60)
GFR, EST NON AFRICAN AMERICAN: 65 mL/min (ref >=60)
Glucose, Bld: 88 mg/dL (ref 65–99)
Potassium: 4 mmol/L (ref 3.5–5.3)
Sodium: 138 mmol/L (ref 135–146)
TOTAL PROTEIN: 7.1 g/dL (ref 6.1–8.1)

## 2015-06-04 LAB — HEMOGLOBIN A1C
Hgb A1c MFr Bld: 5.9 % — ABNORMAL HIGH (ref <5.7)
MEAN PLASMA GLUCOSE: 123 mg/dL

## 2015-06-04 MED ORDER — GABAPENTIN 300 MG PO CAPS: 300.0000 mg | ORAL_CAPSULE | Freq: Three times a day (TID) | ORAL | Status: DC

## 2015-06-04 MED ORDER — ACETAMINOPHEN-CODEINE #3 300-30 MG PO TABS: 1.0000 | ORAL_TABLET | ORAL | Status: DC | PRN

## 2015-06-04 MED ORDER — CYCLOBENZAPRINE HCL 10 MG PO TABS: 10.0000 mg | ORAL_TABLET | Freq: Two times a day (BID) | ORAL | Status: DC | PRN

## 2015-06-04 NOTE — Progress Notes (Signed)
Patient ID: Crystal Stafford, female   DOB: July 30, 1960, 55 y.o.   MRN: [id number]   Klair Ursery, is a 55 y.o. female  [id number]  [id number]  DOB - November 06, 1960  Chief Complaint  Patient presents with  . Follow-up    Back Pain + Med Refill        Subjective:   Crystal Stafford is a 55 y.o. female with strength of major depression, hypertension, chronic low back pain and GERD here today for a follow up visit. Patient is complaining of severe low back pain especially on the left side, associated with numbness and tingling of the left leg and fatigue. Patient rates her pain at about 8 out of 10, worse on ambulation and she said her gait has been wobbly. Patient claims compliant with her medications but she takes her gabapentin is not helping her tingling and numbness. She denies any history of fall. She has no trauma. She feels a knot on her lower back close to the upper part of her left buttock that is very painful and freely mobile. Patient has No headache, No chest pain, No abdominal pain - No Nausea, No Cough - SOB.  No problems updated.  ALLERGIES: Allergies  Allergen Reactions  . Doxycycline Swelling and Rash    Face swelling    PAST MEDICAL HISTORY: Past Medical History  Diagnosis Date  . Depression   . MRSA (methicillin resistant Staphylococcus aureus)   . Hypertension   . GERD (gastroesophageal reflux disease)   . Asthma     MEDICATIONS AT HOME: Prior to Admission medications   Medication Sig Start Date End Date Taking? Authorizing Provider  busPIRone (BUSPAR) 10 MG tablet Take 10 mg by mouth daily.    Yes Historical Provider, MD  cyclobenzaprine (FLEXERIL) 10 MG tablet Take 1 tablet (10 mg total) by mouth 2 (two) times daily as needed for muscle spasms. 06/04/15  Yes Tresa Garter, MD  EPINEPHrine (EPIPEN IJ) Inject 1 each as directed once as needed (for allergic reaction).   Yes Historical Provider, MD  esomeprazole (NEXIUM) 40 MG capsule Take 1 capsule (40 mg  total) by mouth daily. Patient taking differently: Take 40 mg by mouth daily as needed (acid reflux, heartburn).  05/08/14  Yes Tresa Garter, MD  acetaminophen-codeine (TYLENOL #3) 300-30 MG tablet Take 1 tablet by mouth every 4 (four) hours as needed. 06/04/15   Tresa Garter, MD  gabapentin (NEURONTIN) 300 MG capsule Take 1 capsule (300 mg total) by mouth 3 (three) times daily. 06/04/15   Tresa Garter, MD     Objective:   Filed Vitals:   06/04/15 1050  BP: 108/76  Pulse: 85  Temp: 98 F (36.7 C)  TempSrc: Oral  Resp: 18  Height: 5\' 4"  (1.626 m)  Weight: 174 lb 9.6 oz (79.198 kg)  SpO2: 99%    Exam General appearance : Awake, alert, not in any distress. Speech Clear. Not toxic looking, obese HEENT: Atraumatic and Normocephalic, pupils equally reactive to light and accomodation Neck: supple, no JVD. No cervical lymphadenopathy.  Chest:Good air entry bilaterally, no added sounds  CVS: S1 S2 regular, no murmurs.  Abdomen: Bowel sounds present, Non tender and not distended with no gaurding, rigidity or rebound. Extremities: B/L Lower Ext shows no edema, both legs are warm to touch, marked tenderness around the left paraspinal space of L4, L5 areas. About 3 X 3 cm round lump, mobile, tender, firm palpated left to the midline of L4 L5 area Neurology:  Awake alert, and oriented X 3, CN II-XII intact, Non focal Skin: No Rash  Data Review Lab Results  Component Value Date   HGBA1C 6.2* 10/12/2014   HGBA1C 6.10 09/04/2014   HGBA1C 6.20 05/08/2014     Assessment & Plan   1. Essential hypertension  - COMPLETE METABOLIC PANEL WITH GFR  We have discussed target BP range and blood pressure goal. I have advised patient to check BP regularly and to call us back or report to clinic if the numbers are consistently higher than 140/90. We discussed the importance of compliance with medical therapy and DASH diet recommended, consequences of uncontrolled hypertension  discussed.   - continue current BP medications  2. Midline low back pain with left-sided sciatica  - cyclobenzaprine (FLEXERIL) 10 MG tablet; Take 1 tablet (10 mg total) by mouth 2 (two) times daily as needed for muscle spasms.  Dispense: 60 tablet; Refill: 3  3. Prediabetes  - gabapentin (NEURONTIN) 300 MG capsule; Take 1 capsule (300 mg total) by mouth 3 (three) times daily.  Dispense: 90 capsule; Refill: 3 - Lipid panel - POCT glycosylated hemoglobin (Hb A1C)  4. Numbness and tingling  - gabapentin (NEURONTIN) 300 MG capsule; Take 1 capsule (300 mg total) by mouth 3 (three) times daily.  Dispense: 90 capsule; Refill: 3 - MR Lumbar Spine Wo Contrast; Future - acetaminophen-codeine (TYLENOL #3) 300-30 MG tablet; Take 1 tablet by mouth every 4 (four) hours as needed.  Dispense: 30 tablet; Refill: 0 - VITAMIN D 25 Hydroxy (Vit-D Deficiency, Fractures)  Burna was counseled on the dangers of tobacco use, and was advised to quit. Reviewed strategies to maximize success, including removing cigarettes and smoking materials from environment, stress management and support of family/friends.  Patient have been counseled extensively about nutrition and exercise  Return in about 6 months (around 12/05/2015) for Follow up Pain and comorbidities.  The patient was given clear instructions to go to ER or return to medical center if symptoms don't improve, worsen or new problems develop. The patient verbalized understanding. The patient was told to call to get lab results if they haven't heard anything in the next week.   This note has been created with Surveyor, quantity. Any transcriptional errors are unintentional.    Angelica Chessman, MD, [place], Karilyn Cota, [place] and [facility] [place], [place]   06/04/2015, 11:36 AM

## 2015-06-04 NOTE — Patient Instructions (Signed)

## 2015-06-04 NOTE — Progress Notes (Signed)
Patient is here for FU Back Pain and Med Refill  Patient denies pain at this tim. Patient complains of feeling fatigue.  Patient has not taken medications today. Patient has eaten today.  Patient request a refill on medications today.

## 2015-06-05 LAB — VITAMIN D 25 HYDROXY (VIT D DEFICIENCY, FRACTURES): Vit D, 25-Hydroxy: 10 ng/mL — ABNORMAL LOW (ref 30–100)

## 2015-06-09 ENCOUNTER — Other Ambulatory Visit: Payer: Self-pay | Admitting: Internal Medicine

## 2015-06-09 DIAGNOSIS — F4310 Post-traumatic stress disorder, unspecified: Secondary | ICD-10-CM | POA: Diagnosis not present

## 2015-06-09 MED ORDER — VITAMIN D (ERGOCALCIFEROL) 1.25 MG (50000 UNIT) PO CAPS: 50000.0000 [IU] | ORAL_CAPSULE | ORAL | Status: DC

## 2015-06-11 ENCOUNTER — Ambulatory Visit (HOSPITAL_COMMUNITY)
Admission: RE | Admit: 2015-06-11 | Discharge: 2015-06-11 | Disposition: A | Discharge status: Home / Self Care | Payer: Commercial Managed Care - HMO | Source: Ambulatory Visit | Attending: Internal Medicine | Admitting: Internal Medicine

## 2015-06-11 DIAGNOSIS — R202 Paresthesia of skin: Secondary | ICD-10-CM | POA: Diagnosis not present

## 2015-06-11 DIAGNOSIS — R200 Anesthesia of skin: Secondary | ICD-10-CM

## 2015-06-11 DIAGNOSIS — M4686 Other specified inflammatory spondylopathies, lumbar region: Secondary | ICD-10-CM | POA: Diagnosis not present

## 2015-06-11 DIAGNOSIS — M545 Low back pain: Secondary | ICD-10-CM | POA: Diagnosis not present

## 2015-06-16 ENCOUNTER — Telehealth: Payer: Self-pay | Admitting: *Deleted

## 2015-06-16 DIAGNOSIS — F4310 Post-traumatic stress disorder, unspecified: Secondary | ICD-10-CM | POA: Diagnosis not present

## 2015-06-16 NOTE — Telephone Encounter (Signed)
-----   Message from Tresa Garter, MD sent at 06/09/2015  5:33 PM EDT ----- Please inform patient that her laboratory results are mostly normal except for vitamin D level that is very low. Hemoglobin A1c is 5.9% which means she still prediabetic. Continue low sugar and low carbohydrate diet, regular physical exercise at least 3 times a week 30 minutes each time. Vitamin D capsule had been prescribed for the pharmacy for pickup.

## 2015-06-16 NOTE — Telephone Encounter (Signed)
Patient verified DOB Patient is aware of lab results being normal except for vitamin d being very low. Patient is aware of picking up supplement from [facility] on [address]. Patient is aware of a1c being 5.9 which does fall under the PreDM range. Patient advised to continue with low sugar and carb diet with regular exercise. No further questions at this time.

## 2015-06-23 ENCOUNTER — Telehealth: Payer: Self-pay | Admitting: Internal Medicine

## 2015-06-23 NOTE — Telephone Encounter (Signed)
Pt. Called requesting her MRI results that she had on 06/11/15.  Pt. Stated if she is unavailable to LMV. Please f/u

## 2015-06-23 NOTE — Telephone Encounter (Signed)
MRI results have not been reviewed by provider at this time.

## 2015-06-24 DIAGNOSIS — F4310 Post-traumatic stress disorder, unspecified: Secondary | ICD-10-CM | POA: Diagnosis not present

## 2015-06-30 DIAGNOSIS — F4310 Post-traumatic stress disorder, unspecified: Secondary | ICD-10-CM | POA: Diagnosis not present

## 2015-07-02 ENCOUNTER — Telehealth: Payer: Self-pay | Admitting: *Deleted

## 2015-07-02 NOTE — Telephone Encounter (Signed)
Patient verified DOB Patient is aware of MRI showing mild to moderate osteoarthritis. Patient is advised to continue current pain medication and if pain persist or worsens to call the office and she will be referred to orthopedic surgeon. Patient expressed her understanding and had no further questions at this time.

## 2015-07-02 NOTE — Telephone Encounter (Signed)
error 

## 2015-07-02 NOTE — Telephone Encounter (Signed)
-----   Message from Tresa Garter, MD sent at 06/24/2015 10:37 AM EDT ----- Please inform patient that her lumbar MRI showed mild to moderate osteoarthritis. No nerve impingement. Continue pain medication and regular physical exercise for now, if pain persists or worsened, we'll refer to orthopedic surgery.

## 2015-07-23 DIAGNOSIS — F331 Major depressive disorder, recurrent, moderate: Secondary | ICD-10-CM | POA: Diagnosis not present

## 2015-07-31 DIAGNOSIS — H53452 Other localized visual field defect, left eye: Secondary | ICD-10-CM | POA: Diagnosis not present

## 2015-07-31 DIAGNOSIS — H52221 Regular astigmatism, right eye: Secondary | ICD-10-CM | POA: Diagnosis not present

## 2015-07-31 DIAGNOSIS — H5201 Hypermetropia, right eye: Secondary | ICD-10-CM | POA: Diagnosis not present

## 2015-07-31 DIAGNOSIS — H35373 Puckering of macula, bilateral: Secondary | ICD-10-CM | POA: Diagnosis not present

## 2015-07-31 DIAGNOSIS — H5202 Hypermetropia, left eye: Secondary | ICD-10-CM | POA: Diagnosis not present

## 2015-07-31 DIAGNOSIS — H2513 Age-related nuclear cataract, bilateral: Secondary | ICD-10-CM | POA: Diagnosis not present

## 2015-07-31 DIAGNOSIS — H524 Presbyopia: Secondary | ICD-10-CM | POA: Diagnosis not present

## 2015-07-31 DIAGNOSIS — H40013 Open angle with borderline findings, low risk, bilateral: Secondary | ICD-10-CM | POA: Diagnosis not present

## 2015-09-04 ENCOUNTER — Other Ambulatory Visit: Payer: Self-pay | Admitting: Internal Medicine

## 2015-09-07 ENCOUNTER — Telehealth: Payer: Self-pay | Admitting: *Deleted

## 2015-09-07 NOTE — Telephone Encounter (Signed)
Patient did not answer and phone disconnected with no VM option.  !!!Please inform patient of Tramadol prescription being placed at the front desk for pick-up!!!

## 2015-09-10 DIAGNOSIS — F331 Major depressive disorder, recurrent, moderate: Secondary | ICD-10-CM | POA: Diagnosis not present

## 2015-09-11 DIAGNOSIS — H2513 Age-related nuclear cataract, bilateral: Secondary | ICD-10-CM | POA: Diagnosis not present

## 2015-09-11 DIAGNOSIS — H40013 Open angle with borderline findings, low risk, bilateral: Secondary | ICD-10-CM | POA: Diagnosis not present

## 2015-09-11 DIAGNOSIS — H53483 Generalized contraction of visual field, bilateral: Secondary | ICD-10-CM | POA: Diagnosis not present

## 2015-09-11 DIAGNOSIS — H04123 Dry eye syndrome of bilateral lacrimal glands: Secondary | ICD-10-CM | POA: Diagnosis not present

## 2015-09-11 DIAGNOSIS — H35373 Puckering of macula, bilateral: Secondary | ICD-10-CM | POA: Diagnosis not present

## 2015-09-25 LAB — GLUCOSE, POCT (MANUAL RESULT ENTRY): POC Glucose: 85 mg/dl (ref 70–99)

## 2015-10-08 DIAGNOSIS — F331 Major depressive disorder, recurrent, moderate: Secondary | ICD-10-CM | POA: Diagnosis not present

## 2015-10-10 ENCOUNTER — Emergency Department (HOSPITAL_COMMUNITY): Payer: Commercial Managed Care - HMO

## 2015-10-10 ENCOUNTER — Emergency Department (HOSPITAL_COMMUNITY)
Admission: EM | Admit: 2015-10-10 | Discharge: 2015-10-11 | Disposition: A | Discharge status: Home / Self Care | Payer: Commercial Managed Care - HMO | Attending: Emergency Medicine | Admitting: Emergency Medicine

## 2015-10-10 ENCOUNTER — Encounter (HOSPITAL_COMMUNITY): Payer: Self-pay

## 2015-10-10 DIAGNOSIS — R079 Chest pain, unspecified: Secondary | ICD-10-CM

## 2015-10-10 DIAGNOSIS — F17210 Nicotine dependence, cigarettes, uncomplicated: Secondary | ICD-10-CM | POA: Insufficient documentation

## 2015-10-10 DIAGNOSIS — I10 Essential (primary) hypertension: Secondary | ICD-10-CM | POA: Insufficient documentation

## 2015-10-10 DIAGNOSIS — R0789 Other chest pain: Secondary | ICD-10-CM | POA: Diagnosis not present

## 2015-10-10 DIAGNOSIS — J45909 Unspecified asthma, uncomplicated: Secondary | ICD-10-CM | POA: Diagnosis not present

## 2015-10-10 HISTORY — DX: Unspecified osteoarthritis, unspecified site: M19.90

## 2015-10-10 LAB — BASIC METABOLIC PANEL
ANION GAP: 9 (ref 5–15)
BUN: 10 mg/dL (ref 6–20)
CALCIUM: 9.4 mg/dL (ref 8.9–10.3)
CO2: 21 mmol/L — ABNORMAL LOW (ref 22–32)
CREATININE: 1.1 mg/dL — AB (ref 0.44–1.00)
Chloride: 106 mmol/L (ref 101–111)
GFR, EST NON AFRICAN AMERICAN: 56 mL/min — AB (ref >60)
Glucose, Bld: 96 mg/dL (ref 65–99)
Potassium: 3.8 mmol/L (ref 3.5–5.1)
SODIUM: 136 mmol/L (ref 135–145)

## 2015-10-10 LAB — CBC
HCT: 41.8 % (ref 36.0–46.0)
HEMOGLOBIN: 13.5 g/dL (ref 12.0–15.0)
MCH: 29.3 pg (ref 26.0–34.0)
MCHC: 32.3 g/dL (ref 30.0–36.0)
MCV: 90.7 fL (ref 78.0–100.0)
PLATELETS: 239 10*3/uL (ref 150–400)
RBC: 4.61 MIL/uL (ref 3.87–5.11)
RDW: 15.3 % (ref 11.5–15.5)
WBC: 7.6 10*3/uL (ref 4.0–10.5)

## 2015-10-10 LAB — I-STAT TROPONIN, ED
TROPONIN I, POC: 0 ng/mL (ref 0.00–0.08)
Troponin i, poc: 0 ng/mL (ref 0.00–0.08)

## 2015-10-10 LAB — D-DIMER, QUANTITATIVE (NOT AT ARMC): D DIMER QUANT: 0.59 ug{FEU}/mL — AB (ref 0.00–0.50)

## 2015-10-10 MED ORDER — SODIUM CHLORIDE 0.9 % IV BOLUS (SEPSIS)
1000.0000 mL | Freq: Once | INTRAVENOUS | Status: AC
  Administered 2015-10-10: 1000 mL via INTRAVENOUS

## 2015-10-10 MED ORDER — IOPAMIDOL (ISOVUE-370) INJECTION 76%
INTRAVENOUS | Status: AC
  Administered 2015-10-10: 80 mL
  Filled 2015-10-10: qty 100

## 2015-10-10 NOTE — ED Notes (Signed)
Pt transported to xray 

## 2015-10-10 NOTE — ED Notes (Signed)
Patient transported to X-ray 

## 2015-10-10 NOTE — ED Notes (Signed)
[facility] sent down to lab for D-dimer

## 2015-10-10 NOTE — ED Notes (Signed)
Pt verbalized understanding of d/c instructions and has no further questions. Pt stable and NAD.  

## 2015-10-10 NOTE — ED Triage Notes (Signed)
Per EMS, pt from with complaint of left sided CP with radiation to the back while outside with her grandchildren. Pt has hx of same and last occurrence pt was diagnosed with "an infection" Pt given 2 nitro with complete relief of CP. Pt now complains of back pain and has osteoarthritis. Pt alert and oriented x4. NAD.

## 2015-10-10 NOTE — ED Provider Notes (Signed)
[facility] DEPT Provider Note   CSN: [id number] Arrival date & time: 10/10/15  1546     History   Chief Complaint Chief Complaint  Patient presents with  . Chest Pain    HPI Crystal Stafford is a 55 y.o. female with a past medical history significant for asthma, GERD and hypertension who presents with chest pain. Patient has come in by family report that today, while spending time with her family, began having pain in the left side of her chest. She reports that she has had similar symptoms when she had an infection in the past. She says the pain was relieved with nitroglycerin. Patient reports mild shortness of breath associated with this however it has resolved. She does report a mild cough but no fevers or chills. She denies nausea, vomiting, diaphoresis, constipation, diarrhea, dysuria. She did report one episode of palpitations that has also resolved. She reports that she has had some lightheadedness for several months but that is unchanged today. She described her pain as sharp, nonradiating, moderate in severity, and not worsened with exertion or deep breathing.  The history is provided by the patient. No language interpreter was used.  Chest Pain   This is a new problem. The current episode started 6 to 12 hours ago. The problem occurs constantly. The problem has been rapidly improving. Associated with: nothing. The pain is present in the lateral region. The pain is at a severity of 4/10. The pain is mild. The quality of the pain is described as sharp. The pain does not radiate. The symptoms are aggravated by certain positions. Associated symptoms include shortness of breath. Pertinent negatives include no abdominal pain, no back pain, no cough, no diaphoresis, no dizziness, no fever, no headaches, no hemoptysis, no irregular heartbeat, no malaise/fatigue, no nausea, no near-syncope, no numbness, no palpitations, no sputum production, no syncope and no vomiting. She has tried nothing  for the symptoms. The treatment provided no relief.  Her past medical history is significant for diabetes and hypertension.  Pertinent negatives for past medical history include no CAD, no CHF and no DVT.    Past Medical History:  Diagnosis Date  . Arthritis   . Asthma   . Depression   . GERD (gastroesophageal reflux disease)   . Hypertension   . MRSA (methicillin resistant Staphylococcus aureus)     Patient Active Problem List   Diagnosis Date Noted  . 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 Hospital discharge follow-up 10/23/2014  . PICC (peripherally inserted central catheter) removal 10/23/2014  . Sepsis ([place])   . Leukocytosis 10/10/2014  . SIRS (systemic inflammatory response syndrome) ([place]) 10/10/2014  . Impaired glucose tolerance 10/10/2014  . Myalgia   . Essential hypertension 10/21/2013  . Midline low back pain with left-sided sciatica 10/21/2013  . Breast cancer screening 10/21/2013  . Gastroesophageal reflux disease without esophagitis 10/21/2013  . Encounter for screening mammogram for malignant neoplasm of breast 10/21/2013  . Numbness and tingling 05/20/2013  . Shoulder pain, right 02/18/2013  . Morbid obesity ([place]) 02/18/2013  . Physical exam, routine 10/25/2012  . Allergy 10/25/2012  . Hypertension 04/06/2012  . Prediabetes 04/06/2012  . Peptic ulcer 04/06/2012  . INSOMNIA 02/04/2010  . SEBACEOUS CYST, SCALP 08/21/2009  . HYPERLIPIDEMIA 06/02/2009  . HEADACHE 06/02/2009  . DEPRESSION 05/19/2009  . FATIGUE 05/19/2009  . 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 TOBACCO ABUSE 11/24/2006  . PELVIC PAIN, RIGHT 11/24/2006  . ASTHMA 11/10/2005    Past Surgical  History:  Procedure Laterality Date  . ABDOMINAL HYSTERECTOMY      OB History    No data available       Home Medications    Prior to Admission medications   Medication Sig Start Date End Date Taking? Authorizing Provider  acetaminophen-codeine (TYLENOL #3) 300-30 MG  tablet Take 1 tablet by mouth every 4 (four) hours as needed. 06/04/15   Tresa Garter, MD  busPIRone (BUSPAR) 10 MG tablet Take 10 mg by mouth daily.     Historical Provider, MD  cyclobenzaprine (FLEXERIL) 10 MG tablet Take 1 tablet (10 mg total) by mouth 2 (two) times daily as needed for muscle spasms. 06/04/15   Tresa Garter, MD  EPINEPHrine (EPIPEN IJ) Inject 1 each as directed once as needed (for allergic reaction).    Historical Provider, MD  esomeprazole (NEXIUM) 40 MG capsule Take 1 capsule (40 mg total) by mouth daily. Patient taking differently: Take 40 mg by mouth daily as needed (acid reflux, heartburn).  05/08/14   Tresa Garter, MD  gabapentin (NEURONTIN) 300 MG capsule Take 1 capsule (300 mg total) by mouth 3 (three) times daily. 06/04/15   Tresa Garter, MD  traMADol (ULTRAM) 50 MG tablet TAKE ONE TABLET BY MOUTH EVERY 6 HOURS AS NEEDED 09/07/15   Tresa Garter, MD  Vitamin D, Ergocalciferol, (DRISDOL) 50000 units CAPS capsule Take 1 capsule (50,000 Units total) by mouth every 7 (seven) days. 06/09/15   Tresa Garter, MD    Family History Family History  Problem Relation Age of Onset  . Colon cancer Maternal Grandfather 36  . Breast cancer Maternal Aunt     Social History Social History  Substance Use Topics  . Smoking status: Current Every Day Smoker    Packs/day: 1.00    Types: Cigarettes  . Smokeless tobacco: Never Used  . Alcohol use No     Allergies   Doxycycline   Review of Systems Review of Systems  Constitutional: Negative for diaphoresis, fever and malaise/fatigue.  HENT: Negative for congestion and rhinorrhea.   Eyes: Negative for visual disturbance.  Respiratory: Positive for shortness of breath. Negative for cough, hemoptysis, sputum production, chest tightness, wheezing and stridor.   Cardiovascular: Positive for chest pain. Negative for palpitations, leg swelling, syncope and near-syncope.  Gastrointestinal: Negative  for abdominal pain, constipation, diarrhea, nausea and vomiting.  Genitourinary: Negative for dysuria.  Musculoskeletal: Negative for back pain.  Skin: Negative for rash and wound.  Neurological: Negative for dizziness, syncope, light-headedness, numbness and headaches.  Psychiatric/Behavioral: Negative for confusion.  All other systems reviewed and are negative.    Physical Exam Updated Vital Signs BP 146/77 (BP Location: Left Arm)   Pulse 100   Temp 98.8 F (37.1 C) (Oral)   Resp 20   Ht 5\' 4"  (1.626 m)   Wt 173 lb (78.5 kg)   SpO2 99%   BMI 29.70 kg/m   Physical Exam  Constitutional: She is oriented to person, place, and time. She appears well-developed and well-nourished. No distress.  HENT:  Head: Normocephalic and atraumatic.  Mouth/Throat: Oropharynx is clear and moist. No oropharyngeal exudate.  Eyes: Conjunctivae and EOM are normal. Pupils are equal, round, and reactive to light.  Neck: Normal range of motion. Neck supple.  Cardiovascular: Normal rate and regular rhythm.   No murmur heard. Pulmonary/Chest: Effort normal and breath sounds normal. No stridor. No respiratory distress. She has no wheezes. She has no rales. She exhibits no tenderness.  Abdominal:  Soft. There is no tenderness.  Musculoskeletal: She exhibits no edema or tenderness.  Neurological: She is alert and oriented to person, place, and time. She has normal reflexes. She displays normal reflexes. No cranial nerve deficit. She exhibits normal muscle tone. Coordination normal.  Skin: Skin is warm and dry. Capillary refill takes less than 2 seconds. She is not diaphoretic.  Psychiatric: She has a normal mood and affect.  Nursing note and vitals reviewed.    ED Treatments / Results  Labs (all labs ordered are listed, but only abnormal results are displayed) Labs Reviewed  BASIC METABOLIC PANEL - Abnormal; Notable for the following:       Result Value   CO2 21 (*)    Creatinine, Ser 1.10 (*)     GFR calc non Af Amer 56 (*)    All other components within normal limits  D-DIMER, QUANTITATIVE (NOT AT [facility]) - Abnormal; Notable for the following:    D-Dimer, Quant 0.59 (*)    All other components within normal limits  CBC  I-STAT TROPOININ, ED  I-STAT TROPOININ, ED    EKG  EKG Interpretation  Date/Time:  Saturday October 10 2015 16:08:51 EDT Ventricular Rate:  92 PR Interval:    QRS Duration: 91 QT Interval:  349 QTC Calculation: 432 R Axis:   42 Text Interpretation:  Sinus rhythm Minimal ST depression, inferior leads Baseline wander in lead(s) II III aVR aVL aVF V1 V2 V3 V4 V5 V6 Appears similar to prior.  Confirmed by Sherry Ruffing MD, [place] [id number]) on 10/10/2015 4:21:15 PM       Radiology Dg Chest 2 View  Result Date: 10/10/2015 CLINICAL DATA:  55 year old female with history of left-sided chest pain radiating to her back earlier today. EXAM: CHEST  2 VIEW COMPARISON:  Chest x-ray 10/22/2014. FINDINGS: Lung volumes are normal. No consolidative airspace disease. No pleural effusions. No pneumothorax. No pulmonary nodule or mass noted. Pulmonary vasculature and the cardiomediastinal silhouette are within normal limits. IMPRESSION: No radiographic evidence of acute cardiopulmonary disease. Electronically Signed   By: Vinnie Langton M.D.   On: 10/10/2015 16:55    Procedures Procedures (including critical care time)  Medications Ordered in ED Medications  sodium chloride 0.9 % bolus 1,000 mL (0 mLs Intravenous Stopped 10/10/15 1824)  iopamidol (ISOVUE-370) 76 % injection (80 mLs  Contrast Given 10/10/15 2305)     Initial Impression / Assessment and Plan / ED Course  I have reviewed the triage vital signs and the nursing notes.  Pertinent labs & imaging results that were available during my care of the patient were reviewed by me and considered in my medical decision making (see chart for details).  Clinical Course    Crystal Stafford is a 55 y.o. female with a past  medical history significant for asthma, GERD and hypertension who presents with chest pain. Patient has come in by family report that today, while spending time with her family, began having pain in the left side of her chest.   History and exam are above.  She reports she was given aspirin and nitroglycerin by EMS.  Patient's EKG appeared similar to prior. Do not feel patient has acute ischemia. Patient had laboratory imaging workup to discover the cause of symptoms. Troponin negative 2, BNP showed slightly decreased CO2 but otherwise no significant vallecular abnormalities. No leukocytosis or anemia. D-dimer was elevated at 0.59. CT-Pulmonary embolism study was ordered.   Chest x-ray showed no acute cardiopulmonary disease and CT study showed no PE.  Patient reported her symptoms had resolved after arrival. She did not have any further symptoms while in the emergency department. Fluid was provided. Patient was observed for a period time and did not have any further symptoms and did not have any significant vital sign changes.   Patient's HEART score was a 4 based on her risk factors and description of symptoms. Shared decision-making conversation was held with patient and her family as patient wanted to go home. Patient understood risk of cardiac event was slightly elevated given her score however, patient reports she will follow-up with her PCP and with cardiology for further provocative testing.   Patient had no further questions or concerns and patient was discharged in good condition.     Final Clinical Impressions(s) / ED Diagnoses   Final diagnoses:  Chest pain, unspecified chest pain type    New Prescriptions New Prescriptions   No medications on file    Clinical Impression: 1. Chest pain, unspecified chest pain type     Disposition: Discharge  Condition: Good  I have discussed the results, Dx and Tx plan with the pt(& family if present). He/she/they expressed  understanding and agree(s) with the plan. Discharge instructions discussed at great length. Strict return precautions discussed and pt &/or family have verbalized understanding of the instructions. No further questions at time of discharge.    New Prescriptions   No medications on file    Follow Up: Tresa Garter, MD [place] [address] [phone]     Grandfather [address] [place] [phone] Schedule an appointment as soon as possible for a visit       Courtney Paris, MD 10/11/15 0005

## 2015-10-10 NOTE — ED Notes (Signed)
Called CT and pt is to go for CTA shortly.

## 2015-10-13 DIAGNOSIS — F331 Major depressive disorder, recurrent, moderate: Secondary | ICD-10-CM | POA: Diagnosis not present

## 2015-10-20 DIAGNOSIS — Z6829 Body mass index (BMI) 29.0-29.9, adult: Secondary | ICD-10-CM | POA: Diagnosis not present

## 2015-10-20 DIAGNOSIS — Z01419 Encounter for gynecological examination (general) (routine) without abnormal findings: Secondary | ICD-10-CM | POA: Diagnosis not present

## 2015-10-20 DIAGNOSIS — N6452 Nipple discharge: Secondary | ICD-10-CM | POA: Diagnosis not present

## 2015-10-26 DIAGNOSIS — N6452 Nipple discharge: Secondary | ICD-10-CM | POA: Insufficient documentation

## 2015-10-30 DIAGNOSIS — H40013 Open angle with borderline findings, low risk, bilateral: Secondary | ICD-10-CM | POA: Diagnosis not present

## 2015-10-30 DIAGNOSIS — H53483 Generalized contraction of visual field, bilateral: Secondary | ICD-10-CM | POA: Diagnosis not present

## 2015-10-30 DIAGNOSIS — H04123 Dry eye syndrome of bilateral lacrimal glands: Secondary | ICD-10-CM | POA: Diagnosis not present

## 2015-10-30 DIAGNOSIS — H2513 Age-related nuclear cataract, bilateral: Secondary | ICD-10-CM | POA: Diagnosis not present

## 2015-11-02 ENCOUNTER — Ambulatory Visit: Payer: Commercial Managed Care - HMO | Attending: Internal Medicine | Admitting: Internal Medicine

## 2015-11-02 ENCOUNTER — Encounter: Payer: Self-pay | Admitting: Internal Medicine

## 2015-11-02 VITALS — BP 127/87 | HR 87 | Temp 98.3°F | Resp 16 | Wt 172.8 lb

## 2015-11-02 DIAGNOSIS — F329 Major depressive disorder, single episode, unspecified: Secondary | ICD-10-CM | POA: Insufficient documentation

## 2015-11-02 DIAGNOSIS — Z7689 Persons encountering health services in other specified circumstances: Secondary | ICD-10-CM | POA: Insufficient documentation

## 2015-11-02 DIAGNOSIS — R079 Chest pain, unspecified: Secondary | ICD-10-CM | POA: Diagnosis not present

## 2015-11-02 DIAGNOSIS — Z720 Tobacco use: Secondary | ICD-10-CM | POA: Diagnosis not present

## 2015-11-02 DIAGNOSIS — K219 Gastro-esophageal reflux disease without esophagitis: Secondary | ICD-10-CM

## 2015-11-02 DIAGNOSIS — R200 Anesthesia of skin: Secondary | ICD-10-CM

## 2015-11-02 DIAGNOSIS — Z79899 Other long term (current) drug therapy: Secondary | ICD-10-CM | POA: Insufficient documentation

## 2015-11-02 DIAGNOSIS — F17210 Nicotine dependence, cigarettes, uncomplicated: Secondary | ICD-10-CM | POA: Insufficient documentation

## 2015-11-02 DIAGNOSIS — F419 Anxiety disorder, unspecified: Secondary | ICD-10-CM | POA: Insufficient documentation

## 2015-11-02 DIAGNOSIS — Z23 Encounter for immunization: Secondary | ICD-10-CM

## 2015-11-02 DIAGNOSIS — R7303 Prediabetes: Secondary | ICD-10-CM

## 2015-11-02 DIAGNOSIS — R202 Paresthesia of skin: Secondary | ICD-10-CM

## 2015-11-02 MED ORDER — ACETAMINOPHEN-CODEINE #3 300-30 MG PO TABS: 1.0000 | ORAL_TABLET | ORAL | 0 refills | Status: DC | PRN

## 2015-11-02 MED ORDER — ESOMEPRAZOLE MAGNESIUM 40 MG PO CPDR: 40.0000 mg | DELAYED_RELEASE_CAPSULE | Freq: Every day | ORAL | 3 refills | Status: DC

## 2015-11-02 MED ORDER — GABAPENTIN 100 MG PO CAPS: 200.0000 mg | ORAL_CAPSULE | Freq: Three times a day (TID) | ORAL | 2 refills | Status: DC

## 2015-11-02 MED FILL — ACETAMINOPHEN/COD #3 TABLET: 300-30 | 10 days supply | Qty: 60 | Fill #0

## 2015-11-02 NOTE — Progress Notes (Signed)
Pt is in the office today for a ED follow up Pt states her pain level today in the office is a 5 Pt states the pain is coming from her back and legs Pt states her back and legs have been hurting for over 2 months Pt states her chest pain has gotten better since visiting the ED

## 2015-11-02 NOTE — Progress Notes (Addendum)
Crystal Stafford, is a 55 y.o. female  [id number]  [id number]  DOB - [birth year]  CC:  Chief Complaint  Patient presents with  . Follow-up       HPI: Crystal Stafford is a 55 y.o. female here today to establish medical care., last seen in clinic 5/17.  Recent Ed visit 9/30 for chest pain, left chest region, nonradiating.. Was playing w/ kids, progressive pain.  When arrived to Ed, pain slightly improved, received ntg and thinks it may have eased the pain a bit.  Associated pain in back as well at time. No  Pleuritic pain. Denies sob /chest pain currently.  More upper left back region pain currently, decribed 5/10. Taking her nexium only prn when gets heartburn.    Per pt, feels tired all the time now, has depression/anxiety, goes to [facility] services and currently on lexapro 10 qhs, and prn hydroxyzine.  Denies si/hi/avh. She states only taking neurontin at night b/c it seems to make her more tired and sometimes wakes her up w/ a "jolt" in her back.  +1ppd.  Patient has No headache, No chest pain currenlty, No abdominal pain - No Nausea, No new weakness tingling or numbness, No Cough - SOB.    Review of Systems: Per HPI, o/w all systems reviewed and negative.   Allergies  Allergen Reactions  . Doxycycline Swelling and Rash    Face swelling   Past Medical History:  Diagnosis Date  . Arthritis   . Asthma   . Depression   . GERD (gastroesophageal reflux disease)   . Hypertension   . MRSA (methicillin resistant Staphylococcus aureus)    Current Outpatient Prescriptions on File Prior to Visit  Medication Sig Dispense Refill  . cyclobenzaprine (FLEXERIL) 10 MG tablet Take 1 tablet (10 mg total) by mouth 2 (two) times daily as needed for muscle spasms. 60 tablet 3  . EPINEPHrine (EPIPEN IJ) Inject 1 each as directed daily as needed (for allergic reaction).     . Multiple Vitamin (MULTIVITAMIN WITH MINERALS) TABS tablet Take 1 tablet by mouth daily. Gummy    . traMADol (ULTRAM)  50 MG tablet TAKE ONE TABLET BY MOUTH EVERY 6 HOURS AS NEEDED (Patient not taking: Reported on 11/02/2015) 60 tablet 3   No current facility-administered medications on file prior to visit.    Family History  Problem Relation Age of Onset  . Colon cancer Maternal Grandfather 65  . Breast cancer Maternal Aunt    Social History   Social History  . Marital status: Divorced    Spouse name: N/A  . Number of children: N/A  . Years of education: N/A   Occupational History  . Not on file.   Social History Main Topics  . Smoking status: Current Every Day Smoker    Packs/day: 1.00    Types: Cigarettes  . Smokeless tobacco: Never Used  . Alcohol use No  . Drug use: No  . Sexual activity: Not on file   Other Topics Concern  . Not on file   Social History Narrative  . No narrative on file    Objective:   Vitals:   11/02/15 0946  BP: 127/87  Pulse: 87  Resp: 16  Temp: 98.3 F (36.8 C)    Filed Weights   11/02/15 0946  Weight: 172 lb 12.8 oz (78.4 kg)    BP Readings from Last 3 Encounters:  11/02/15 127/87  10/10/15 116/79  09/25/15 (!) 149/86    Physical Exam: Constitutional: Patient appears well-developed  and well-nourished. No distress. AAOx3, tired appearing, but pleasant. HENT: Normocephalic, atraumatic, External right and left ear normal. Oropharynx is clear and moist.  Eyes: Conjunctivae and EOM are normal. PERRL, no scleral icterus. Neck: Normal ROM. Neck supple. No JVD.  CVS: RRR, S1/S2 +, no murmurs, no gallops, no carotid bruit.   ttp on left chest wall on exam. Pulmonary: Effort and breath sounds normal, no stridor, rhonchi, wheezes, rales.  Abdominal: Soft. BS +, no distension, tenderness, rebound or guarding.  Musculoskeletal: Normal range of motion. No edema and no tenderness.  LE: bilat/ no c/c/e, pulses 2+ bilateral. Neuro: Alert. muscle tone coordination wnl. No cranial nerve deficit grossly. Skin: Skin is warm and dry. No rash noted. Not  diaphoretic. No erythema. No pallor. Psychiatric: Normal mood and affect. Behavior, judgment, thought content normal.  Lab Results  Component Value Date   WBC 7.6 10/10/2015   HGB 13.5 10/10/2015   HCT 41.8 10/10/2015   MCV 90.7 10/10/2015   PLT 239 10/10/2015   Lab Results  Component Value Date   CREATININE 1.10 (H) 10/10/2015   BUN 10 10/10/2015   NA 136 10/10/2015   K 3.8 10/10/2015   CL 106 10/10/2015   CO2 21 (L) 10/10/2015    Lab Results  Component Value Date   HGBA1C 5.9 (H) 06/04/2015   Lipid Panel     Component Value Date/Time   CHOL 200 06/04/2015 1147   TRIG 225 (H) 06/04/2015 1147   HDL 30 (L) 06/04/2015 1147   CHOLHDL 6.7 (H) 06/04/2015 1147   VLDL 45 (H) 06/04/2015 1147   LDLCALC 125 06/04/2015 1147       Depression screen PHQ 2/9 11/02/2015 06/04/2015 10/23/2014 10/23/2014  Decreased Interest 1 3 0 0  Down, Depressed, Hopeless 2 3 0 0  PHQ - 2 Score 3 6 0 0  Altered sleeping 1 3 - -  Tired, decreased energy 1 3 - -  Change in appetite 1 2 - -  Feeling bad or failure about yourself  2 0 - -  Trouble concentrating 2 2 - -  Moving slowly or fidgety/restless 1 3 - -  Suicidal thoughts 0 0 - -  PHQ-9 Score 11 19 - -    Assessment and plan:   1. Chest pain, unspecified type - ttp on left chest wall today, suspect msk vs gi pain, recd takes nexium daily for now, also trial reduce dose of neurontin to 100mg  tabs, trial 200tid, may help w/ her back pains. Prn tylenol #3 renewed as well. - Ambulatory referral to Cardiology  - stress test recd given RFS  2. Prediabetes - gabapentin (NEURONTIN) 100 MG capsule; Take 2 capsules (200 mg total) by mouth 3 (three) times daily.  Dispense: 120 capsule; Refill: 2  3. Numbness and tingling, lumbar djd - gabapentin (NEURONTIN) 100 MG capsule; Take 2 capsules (200 mg total) by mouth 3 (three) times daily.  Dispense: 120 capsule; Refill: 2 - acetaminophen-codeine (TYLENOL #3) 300-30 MG tablet; Take 1 tablet by  mouth every 4 (four) hours as needed for moderate pain.  Dispense: 60 tablet; Refill: 0 - dw pt pain contract, amendable to signing.  4. Tobacco abuse tob cessation recd. Recd 1800quitline.  Can't trial zyban since already on lexapro, was on buspar at one time, and now on hydroxyzine prn as well.  5. Gastroesophageal reflux disease without esophagitis Recd takes ppi daily for now. - esomeprazole (NEXIUM) 40 MG capsule; Take 1 capsule (40 mg total) by mouth daily.  Dispense: 30 capsule; Refill: 3  6. Immunizations Pneumococcal 23v today, flu vac today.  Return in about 8 weeks (around 12/28/2015) for health maintenance.  The patient was given clear instructions to go to ER or return to medical center if symptoms don't improve, worsen or new problems develop. The patient verbalized understanding. The patient was told to call to get lab results if they haven't heard anything in the next week.    This note has been created with Surveyor, quantity. Any transcriptional errors are unintentional.   Maren Reamer, MD, [place], [place]   11/02/2015, 10:05 AM

## 2015-11-02 NOTE — Patient Instructions (Addendum)
Influenza Virus Vaccine injection (Fluarix) What is this medicine? INFLUENZA VIRUS VACCINE (in floo EN zuh VAHY ruhs vak SEEN) helps to reduce the risk of getting influenza also known as the flu. This medicine may be used for other purposes; ask your health care provider or pharmacist if you have questions. What should I tell my health care provider before I take this medicine? They need to know if you have any of these conditions: -bleeding disorder like hemophilia -fever or infection -Guillain-Barre syndrome or other neurological problems -immune system problems -infection with the human immunodeficiency virus (HIV) or AIDS -low blood platelet counts -multiple sclerosis -an unusual or allergic reaction to influenza virus vaccine, eggs, chicken proteins, latex, gentamicin, other medicines, foods, dyes or preservatives -pregnant or trying to get pregnant -breast-feeding How should I use this medicine? This vaccine is for injection into a muscle. It is given by a health care professional. A copy of Vaccine Information Statements will be given before each vaccination. Read this sheet carefully each time. The sheet may change frequently. Talk to your pediatrician regarding the use of this medicine in children. Special care may be needed. Overdosage: If you think you have taken too much of this medicine contact a poison control center or emergency room at once. NOTE: This medicine is only for you. Do not share this medicine with others. What if I miss a dose? This does not apply. What may interact with this medicine? -chemotherapy or radiation therapy -medicines that lower your immune system like etanercept, anakinra, infliximab, and adalimumab -medicines that treat or prevent blood clots like warfarin -phenytoin -steroid medicines like prednisone or cortisone -theophylline -vaccines This list may not describe all possible interactions. Give your health care provider a list of all the  medicines, herbs, non-prescription drugs, or dietary supplements you use. Also tell them if you smoke, drink alcohol, or use illegal drugs. Some items may interact with your medicine. What should I watch for while using this medicine? Report any side effects that do not go away within 3 days to your doctor or health care professional. Call your health care provider if any unusual symptoms occur within 6 weeks of receiving this vaccine. You may still catch the flu, but the illness is not usually as bad. You cannot get the flu from the vaccine. The vaccine will not protect against colds or other illnesses that may cause fever. The vaccine is needed every year. What side effects may I notice from receiving this medicine? Side effects that you should report to your doctor or health care professional as soon as possible: -allergic reactions like skin rash, itching or hives, swelling of the face, lips, or tongue Side effects that usually do not require medical attention (report to your doctor or health care professional if they continue or are bothersome): -fever -headache -muscle aches and pains -pain, tenderness, redness, or swelling at site where injected -weak or tired This list may not describe all possible side effects. Call your doctor for medical advice about side effects. You may report side effects to FDA at 1-800-FDA-1088. Where should I keep my medicine? This vaccine is only given in a clinic, pharmacy, doctor's office, or other health care setting and will not be stored at home. NOTE: This sheet is a summary. It may not cover all possible information. If you have questions about this medicine, talk to your doctor, pharmacist, or health care provider.    2016, Elsevier/Gold Standard. (2007-07-25 09:30:40) Pneumococcal Polysaccharide Vaccine: What You Need to Know  1. Why get vaccinated? Vaccination can protect older adults (and some children and younger adults) from pneumococcal  disease. Pneumococcal disease is caused by bacteria that can spread from person to person through close contact. It can cause ear infections, and it can also lead to more serious infections of the:   Lungs (pneumonia),  Blood (bacteremia), and  Covering of the brain and spinal cord (meningitis). Meningitis can cause deafness and brain damage, and it can be fatal. Anyone can get pneumococcal disease, but children under 15 years of age, people with certain medical conditions, adults over 86 years of age, and cigarette smokers are at the highest risk. About 18,000 older adults die each year from pneumococcal disease in the [place]. Treatment of pneumococcal infections with penicillin and other drugs used to be more effective. But some strains of the disease have become resistant to these drugs. This makes prevention of the disease, through vaccination, even more important. 2. Pneumococcal polysaccharide vaccine (PPSV23) Pneumococcal polysaccharide vaccine (PPSV23) protects against 23 types of pneumococcal bacteria. It will not prevent all pneumococcal disease. PPSV23 is recommended for:  All adults 36 years of age and older,  Anyone 2 through 53 years of age with certain long-term health problems,  Anyone 2 through 53 years of age with a weakened immune system,  Adults 22 through 53 years of age who smoke cigarettes or have asthma. Most people need only one dose of PPSV. A second dose is recommended for certain high-risk groups. People 52 and older should get a dose even if they have gotten one or more doses of the vaccine before they turned 65. Your healthcare provider can give you more information about these recommendations. Most healthy adults develop protection within 2 to 3 weeks of getting the shot. 3. Some people should not get this vaccine  Anyone who has had a life-threatening allergic reaction to PPSV should not get another dose.  Anyone who has a severe allergy to any  component of PPSV should not receive it. Tell your provider if you have any severe allergies.  Anyone who is moderately or severely ill when the shot is scheduled may be asked to wait until they recover before getting the vaccine. Someone with a mild illness can usually be vaccinated.  Children less than 66 years of age should not receive this vaccine.  There is no evidence that PPSV is harmful to either a pregnant woman or to her fetus. However, as a precaution, women who need the vaccine should be vaccinated before becoming pregnant, if possible. 4. Risks of a vaccine reaction With any medicine, including vaccines, there is a chance of side effects. These are usually mild and go away on their own, but serious reactions are also possible. About half of people who get PPSV have mild side effects, such as redness or pain where the shot is given, which go away within about two days. Less than 1 out of 100 people develop a fever, muscle aches, or more severe local reactions. Problems that could happen after any vaccine:  People sometimes faint after a medical procedure, including vaccination. Sitting or lying down for about 15 minutes can help prevent fainting, and injuries caused by a fall. Tell your doctor if you feel dizzy, or have vision changes or ringing in the ears.  Some people get severe pain in the shoulder and have difficulty moving the arm where a shot was given. This happens very rarely.  Any medication can cause a severe allergic reaction. Such  reactions from a vaccine are very rare, estimated at about 1 in a million doses, and would happen within a few minutes to a few hours after the vaccination. As with any medicine, there is a very remote chance of a vaccine causing a serious injury or death. The safety of vaccines is always being monitored. For more information, visit: http://www.aguilar.org/ 5. What if there is a serious reaction? What should I look for? Look for anything  that concerns you, such as signs of a severe allergic reaction, very high fever, or unusual behavior.  Signs of a severe allergic reaction can include hives, swelling of the face and throat, difficulty breathing, a fast heartbeat, dizziness, and weakness. These would usually start a few minutes to a few hours after the vaccination. What should I do? If you think it is a severe allergic reaction or other emergency that can't wait, call 9-1-1 or get to the nearest hospital. Otherwise, call your doctor. Afterward, the reaction should be reported to the Vaccine Adverse Event Reporting System (VAERS). Your doctor might file this report, or you can do it yourself through the VAERS web site at www.vaers.SamedayNews.es, or by calling 709-871-4339.  VAERS does not give medical advice. 6. How can I learn more?  Ask your doctor. He or she can give you the vaccine package insert or suggest other sources of information.  Call your local or state health department.  Contact the Centers for Disease Control and Prevention (CDC):  Call (701) 624-2930 (1-800-CDC-INFO) or  Visit CDC's website at http://hunter.com/ CDC Pneumococcal Polysaccharide Vaccine VIS (05/03/13)   This information is not intended to replace advice given to you by your health care provider. Make sure you discuss any questions you have with your health care provider.   Document Released: 10/24/2005 Document Revised: 01/17/2014 Document Reviewed: 05/06/2013 Elsevier Interactive Patient Education [employer].

## 2015-11-09 DIAGNOSIS — F331 Major depressive disorder, recurrent, moderate: Secondary | ICD-10-CM | POA: Diagnosis not present

## 2015-11-09 NOTE — Progress Notes (Signed)
CARDIOLOGY OFFICE NOTE  Date:  11/10/2015    Crystal Stafford Date of Birth: 27-Mar-1960 Medical Record [id number]  PCP:  Maren Reamer, MD  Cardiologist:  Curt Bears (DOD)  Chief Complaint  Patient presents with  . Chest Pain    New patient visit - seen for Dr. Curt Bears    History of Present Illness: Crystal Stafford is a 55 y.o. female who presents today for a new patient visit. Seen for Dr. Curt Bears (DOD).   She does not have a cardiac history. She has asthma, GERD and HTN.   Presented to the ER last month with chest pain. Arrived by EMS. Was given aspirin and NTG by EMS with relief. No acute ischemia. Negative Troponins. D dimer elevated but CT negative for PE. Discharged home with instructions to see PCP. Heart Score by ER physician was 4.   Re-established with her PCP last week. Fatigue and depression endorsed. Smoking. Prediabetic. Lots of lumbar issues along with what sounds like restless leg.   Comes in today. Here with her fiance. She has not had any more chest pain - this was described more like a soreness - hurt to touch - lasted for several hours - occurred while playing with her grandchildren. She notes easy fatigue and DOE. Still smoking. Very limited exercise. More bothered by back/leg issues. No BP medicines. Says she cannot take aspirin products due to her GERD. Not ready to stop smoking.   Past Medical History:  Diagnosis Date  . Arthritis   . Asthma   . Depression   . GERD (gastroesophageal reflux disease)   . Hypertension   . MRSA (methicillin resistant Staphylococcus aureus)     Past Surgical History:  Procedure Laterality Date  . ABDOMINAL HYSTERECTOMY       Medications: Current Outpatient Prescriptions  Medication Sig Dispense Refill  . acetaminophen-codeine (TYLENOL #3) 300-30 MG tablet Take 1 tablet by mouth every 4 (four) hours as needed for moderate pain. 60 tablet 0  . cyclobenzaprine (FLEXERIL) 10 MG tablet Take 1 tablet (10 mg total) by  mouth 2 (two) times daily as needed for muscle spasms. 60 tablet 3  . escitalopram (LEXAPRO) 10 MG tablet Take 10 mg by mouth daily.    Marland Kitchen esomeprazole (NEXIUM) 40 MG capsule Take 1 capsule (40 mg total) by mouth daily. 30 capsule 3  . gabapentin (NEURONTIN) 100 MG capsule Take 2 capsules (200 mg total) by mouth 3 (three) times daily. 120 capsule 2  . hydrOXYzine (ATARAX/VISTARIL) 25 MG tablet Take 25 mg by mouth at bedtime as needed.    . Multiple Vitamin (MULTIVITAMIN WITH MINERALS) TABS tablet Take 1 tablet by mouth daily. Gummy     No current facility-administered medications for this visit.     Allergies: Allergies  Allergen Reactions  . Doxycycline Swelling and Rash    Face swelling    Social History: The patient  reports that she has been smoking Cigarettes.  She has been smoking about 1.00 pack per day. She has never used smokeless tobacco. She reports that she does not drink alcohol or use drugs.   Family History: The patient's family history includes Breast cancer in her maternal aunt; Colon cancer (age of onset: 39) in her maternal grandfather.   Review of Systems: Please see the history of present illness.   Otherwise, the review of systems is positive for none.   All other systems are reviewed and negative.   Physical Exam: VS:  BP 134/84  Pulse 87   Ht 5\' 4"  (1.626 m)   Wt 173 lb 6.4 oz (78.7 kg)   BMI 29.76 kg/m  .  BMI Body mass index is 29.76 kg/m.  Wt Readings from Last 3 Encounters:  11/10/15 173 lb 6.4 oz (78.7 kg)  11/02/15 172 lb 12.8 oz (78.4 kg)  10/10/15 173 lb (78.5 kg)    General: Pleasant. Obese black female who is alert and no acute distress.   HEENT: Normal.  Neck: Supple, no JVD, carotid bruits, or masses noted.  Cardiac: Regular rate and rhythm. No murmurs, rubs, or gallops. No edema.  Respiratory:  Lungs are clear to auscultation bilaterally with normal work of breathing.  GI: Soft and nontender.  MS: No deformity or atrophy. Gait and  ROM intact.  Skin: Warm and dry. Color is normal.  Neuro:  Strength and sensation are intact and no gross focal deficits noted.  Psych: Alert, appropriate and with normal affect.   LABORATORY DATA:  EKG:  EKG is ordered today. This demonstrates NSR with PACs - lots of artifact.  Lab Results  Component Value Date   WBC 7.6 10/10/2015   HGB 13.5 10/10/2015   HCT 41.8 10/10/2015   PLT 239 10/10/2015   GLUCOSE 96 10/10/2015   CHOL 200 06/04/2015   TRIG 225 (H) 06/04/2015   HDL 30 (L) 06/04/2015   LDLCALC 125 06/04/2015   ALT 14 06/04/2015   AST 19 06/04/2015   NA 136 10/10/2015   K 3.8 10/10/2015   CL 106 10/10/2015   CREATININE 1.10 (H) 10/10/2015   BUN 10 10/10/2015   CO2 21 (L) 10/10/2015   TSH 1.192 09/04/2014   INR 1.12 10/10/2014   HGBA1C 5.9 (H) 06/04/2015    BNP (last 3 results) No results for input(s): BNP in the last 8760 hours.  ProBNP (last 3 results) No results for input(s): PROBNP in the last 8760 hours.   Other Studies Reviewed Today:   Assessment/Plan: 1. Chest pain - atypical features but with multiple CV risk factors - prediabetic, smoking, post menopausal, HLD and reported HTN - will arrange for [place] - unable to walk on treadmill due to her back/leg issues. Needs to really work on making her health a priority. She is not able to take aspirin. Further disposition to follow.  2. HTN - BP ok. Not on any BP meds.   3. Tobacco abuse - not ready to stop.   4. Fatigue/depression - most likely multifactorial  5. Pre diabetes  Current medicines are reviewed with the patient today.  The patient does not have concerns regarding medicines other than what has been noted above.  The following changes have been made:  See above.  Labs/ tests ordered today include:   No orders of the defined types were placed in this encounter.    Disposition:   Further disposition pending.   Patient is agreeable to this plan and will call if any problems  develop in the interim.   Signed: Burtis Junes, RN, ANP-C 11/10/2015 8:27 AM  [facility] [address] [place], [place]  [address] Phone: [phone] Fax: [phone]

## 2015-11-10 ENCOUNTER — Ambulatory Visit (INDEPENDENT_AMBULATORY_CARE_PROVIDER_SITE_OTHER): Payer: Commercial Managed Care - HMO | Admitting: Nurse Practitioner

## 2015-11-10 ENCOUNTER — Telehealth (HOSPITAL_COMMUNITY): Payer: Self-pay | Admitting: *Deleted

## 2015-11-10 ENCOUNTER — Ambulatory Visit: Payer: Commercial Managed Care - HMO | Admitting: Physician Assistant

## 2015-11-10 ENCOUNTER — Encounter: Payer: Self-pay | Admitting: Nurse Practitioner

## 2015-11-10 VITALS — BP 134/84 | HR 87 | Ht 64.0 in | Wt 173.4 lb

## 2015-11-10 DIAGNOSIS — R0789 Other chest pain: Secondary | ICD-10-CM | POA: Diagnosis not present

## 2015-11-10 NOTE — Telephone Encounter (Signed)
Left message on voicemail per DPR in reference to upcoming appointment scheduled on 11/13/15 at [facility] with detailed instructions given per Myocardial Perfusion Study Information Sheet for the test. LM to arrive 15 minutes early, and that it is imperative to arrive on time for appointment to keep from having the test rescheduled. If you need to cancel or reschedule your appointment, please call the office within 24 hours of your appointment. Failure to do so may result in a cancellation of your appointment, and a $50 no show fee. Phone number given for call back for any questions.

## 2015-11-10 NOTE — Patient Instructions (Addendum)
We will be checking the following labs today - NONE   Medication Instructions:    Continue with your current medicines.     Testing/Procedures To Be Arranged:  Lexiscan Myoview  Follow-Up:   We will see how your stress test turns out and then decide about your follow up    Other Special Instructions:   Think about what we talked about today.     If you need a refill on your cardiac medications before your next appointment, please call your pharmacy.   Call the [place] office at [phone] if you have any questions, problems or concerns.

## 2015-11-11 ENCOUNTER — Other Ambulatory Visit: Payer: Self-pay | Admitting: Nurse Practitioner

## 2015-11-11 DIAGNOSIS — R079 Chest pain, unspecified: Secondary | ICD-10-CM

## 2015-11-13 ENCOUNTER — Ambulatory Visit (HOSPITAL_COMMUNITY): Payer: Commercial Managed Care - HMO | Attending: Cardiovascular Disease

## 2015-11-13 DIAGNOSIS — R079 Chest pain, unspecified: Secondary | ICD-10-CM | POA: Insufficient documentation

## 2015-11-13 LAB — MYOCARDIAL PERFUSION IMAGING
LV dias vol: 76 mL (ref 46–106)
LV sys vol: 30 mL
Peak HR: 117 {beats}/min
RATE: 0.29
Rest HR: 75 {beats}/min
SDS: 2
SRS: 6
SSS: 8
TID: 1.14

## 2015-11-13 MED ORDER — TECHNETIUM TC 99M TETROFOSMIN IV KIT
10.3000 | PACK | Freq: Once | INTRAVENOUS | Status: AC | PRN
  Administered 2015-11-13: 10.3 via INTRAVENOUS
  Filled 2015-11-13: qty 11

## 2015-11-13 MED ORDER — TECHNETIUM TC 99M TETROFOSMIN IV KIT
30.7000 | PACK | Freq: Once | INTRAVENOUS | Status: AC | PRN
  Administered 2015-11-13: 30.7 via INTRAVENOUS
  Filled 2015-11-13: qty 31

## 2015-11-13 MED ORDER — REGADENOSON 0.4 MG/5ML IV SOLN
0.4000 mg | Freq: Once | INTRAVENOUS | Status: AC
  Administered 2015-11-13: 0.4 mg via INTRAVENOUS

## 2015-11-26 DIAGNOSIS — F331 Major depressive disorder, recurrent, moderate: Secondary | ICD-10-CM | POA: Diagnosis not present

## 2015-12-15 DIAGNOSIS — F4310 Post-traumatic stress disorder, unspecified: Secondary | ICD-10-CM | POA: Diagnosis not present

## 2016-02-11 DIAGNOSIS — F331 Major depressive disorder, recurrent, moderate: Secondary | ICD-10-CM | POA: Diagnosis not present

## 2016-05-05 DIAGNOSIS — F4310 Post-traumatic stress disorder, unspecified: Secondary | ICD-10-CM | POA: Diagnosis not present

## 2016-05-26 ENCOUNTER — Encounter: Payer: Self-pay | Admitting: Internal Medicine

## 2016-08-04 DIAGNOSIS — F331 Major depressive disorder, recurrent, moderate: Secondary | ICD-10-CM | POA: Diagnosis not present

## 2016-08-09 DIAGNOSIS — F4321 Adjustment disorder with depressed mood: Secondary | ICD-10-CM | POA: Diagnosis not present

## 2016-08-24 DIAGNOSIS — F4321 Adjustment disorder with depressed mood: Secondary | ICD-10-CM | POA: Diagnosis not present

## 2016-09-01 ENCOUNTER — Ambulatory Visit: Payer: Medicare HMO | Attending: Internal Medicine | Admitting: Physician Assistant

## 2016-09-01 VITALS — BP 130/84 | HR 103 | Temp 98.4°F | Resp 18 | Ht 64.0 in | Wt 168.0 lb

## 2016-09-01 DIAGNOSIS — I10 Essential (primary) hypertension: Secondary | ICD-10-CM

## 2016-09-01 DIAGNOSIS — Z8614 Personal history of Methicillin resistant Staphylococcus aureus infection: Secondary | ICD-10-CM | POA: Insufficient documentation

## 2016-09-01 DIAGNOSIS — G44209 Tension-type headache, unspecified, not intractable: Secondary | ICD-10-CM | POA: Diagnosis not present

## 2016-09-01 DIAGNOSIS — J45909 Unspecified asthma, uncomplicated: Secondary | ICD-10-CM | POA: Insufficient documentation

## 2016-09-01 DIAGNOSIS — M1990 Unspecified osteoarthritis, unspecified site: Secondary | ICD-10-CM | POA: Insufficient documentation

## 2016-09-01 DIAGNOSIS — K219 Gastro-esophageal reflux disease without esophagitis: Secondary | ICD-10-CM | POA: Insufficient documentation

## 2016-09-01 DIAGNOSIS — F329 Major depressive disorder, single episode, unspecified: Secondary | ICD-10-CM | POA: Diagnosis not present

## 2016-09-01 DIAGNOSIS — G44201 Tension-type headache, unspecified, intractable: Secondary | ICD-10-CM | POA: Insufficient documentation

## 2016-09-01 MED ORDER — METHOCARBAMOL 500 MG PO TABS: 500.0000 mg | ORAL_TABLET | Freq: Three times a day (TID) | ORAL | 0 refills | Status: DC

## 2016-09-01 MED ORDER — IBUPROFEN 800 MG PO TABS
ORAL_TABLET | ORAL | 0 refills | Status: DC
Start: 2016-09-01 — End: 2016-12-10

## 2016-09-01 NOTE — Progress Notes (Signed)
Crystal Stafford, is a 56 y.o. female  ZES:[id number]  [id number]  DOB - 14-Jul-1960  Subjective:  Chief Complaint and HPI: Crystal Stafford is a 56 y.o. female here today for L sided, throbbing HA X 5 days that won't go away.  She has never had a headache like this or that is this bad.  +photophobia.  Pain also in L back of neck.  No vision changes.  No N/V/D.  Tylenol#3 didn't help.  No prodrome.  No precipitating or alleviating factors. No thunderclap.  +phonophobia.  +sinus congestion.  No f/c.  Family history: no FH aneurysm  ROS:   Constitutional:  No f/c, No night sweats, No unexplained weight loss. EENT:  No vision changes, No blurry vision, No hearing changes. No mouth, throat, or ear problems.  Respiratory: No cough, No SOB Cardiac: No CP, no palpitations GI:  No abd pain, No N/V/D. GU: No Urinary s/sx Musculoskeletal: No joint pain Neuro: + headache, no dizziness, no motor weakness.  Skin: No rash Endocrine:  No polydipsia. No polyuria.  Psych: Denies SI/HI  No problems updated.  ALLERGIES: Allergies  Allergen Reactions  . Doxycycline Swelling and Rash    Face swelling    PAST MEDICAL HISTORY: Past Medical History:  Diagnosis Date  . Arthritis   . Asthma   . Depression   . GERD (gastroesophageal reflux disease)   . Hypertension   . MRSA (methicillin resistant Staphylococcus aureus)     MEDICATIONS AT HOME: Prior to Admission medications   Medication Sig Start Date End Date Taking? Authorizing Provider  acetaminophen-codeine (TYLENOL #3) 300-30 MG tablet Take 1 tablet by mouth every 4 (four) hours as needed for moderate pain. 11/02/15  Yes Langeland, Dawn T, MD  escitalopram (LEXAPRO) 10 MG tablet Take 10 mg by mouth daily.   Yes [provider]  esomeprazole (NEXIUM) 40 MG capsule Take 1 capsule (40 mg total) by mouth daily. 11/02/15  Yes Langeland, Dawn T, MD  gabapentin (NEURONTIN) 100 MG capsule Take 2 capsules (200 mg total) by mouth 3 (three)  times daily. 11/02/15  Yes Langeland, Dawn T, MD  hydrOXYzine (ATARAX/VISTARIL) 25 MG tablet Take 25 mg by mouth at bedtime as needed.   Yes [provider]  Multiple Vitamin (MULTIVITAMIN WITH MINERALS) TABS tablet Take 1 tablet by mouth daily. Gummy   Yes [provider]  ibuprofen (ADVIL,MOTRIN) 800 MG tablet 1 tablet with food X 7 days then prn headache 09/01/16   Freeman Caldron M, PA-C  methocarbamol (ROBAXIN) 500 MG tablet Take 1 tablet (500 mg total) by mouth 3 (three) times daily. X 7days then prn muscle spasm/headache 09/01/16   Argentina Donovan, PA-C     Objective:  EXAM:   Vitals:   09/01/16 1442  BP: 130/84  Pulse: (!) 103  Resp: 18  Temp: 98.4 F (36.9 C)  TempSrc: Oral  SpO2: 99%  Weight: 168 lb (76.2 kg)  Height: 5\' 4"  (1.626 m)    General appearance : A&OX3. NAD. Non-toxic-appearing HEENT: Atraumatic and Normocephalic.  PERRLA. EOM intact.  Fundi benignTM clear B. Mouth-MMM, post pharynx WNL w/o erythema, No PND. Neck: supple, no JVD. No cervical lymphadenopathy. No thyromegaly Chest/Lungs:  Breathing-non-labored, Good air entry bilaterally, breath sounds normal without rales, rhonchi, or wheezing  CVS: S1 S2 regular, no murmurs, gallops, rubs.  Rate is at 84 on exam Extremities: Bilateral Lower Ext shows no edema, both legs are warm to touch with = pulse throughout Neurology:  CN II-XII grossly intact,  Non focal.  Finger to nose, heel to shin intact.  Neg Rhomberg Psych:  TP linear. J/I WNL. Normal speech. Appropriate eye contact and affect.  Skin:  No Rash  Data Review Lab Results  Component Value Date   HGBA1C 5.9 (H) 06/04/2015   HGBA1C 6.2 (H) 10/12/2014   HGBA1C 6.10 09/04/2014     Assessment & Plan    1. Acute intractable tension-type headache - CT Head Wo Contrast; Future bc she has never had a HA like this before but seems like tension HA and will treat accordingly if CT is neg. - TSH - ibuprofen (ADVIL,MOTRIN) 800 MG  tablet; 1 tablet with food X 7 days then prn headache  Dispense: 60 tablet; Refill: 0 - methocarbamol (ROBAXIN) 500 MG tablet; Take 1 tablet (500 mg total) by mouth 3 (three) times daily. X 7days then prn muscle spasm/headache  Dispense: 90 tablet; Refill: 0  1. Hypertension, unspecified type Controlled.  Continue current regimen - Comprehensive metabolic panel    Patient have been counseled extensively about nutrition and exercise  Return in about 1 month (around 10/02/2016) for assign new pcp; f/up HA.  The patient was given clear instructions to go to ER or return to medical center if symptoms don't improve, worsen or new problems develop. The patient verbalized understanding. The patient was told to call to get lab results if they haven't heard anything in the next week.     Freeman Caldron, PA-C [facility] and [facility] [place], [place]   09/01/2016, 3:24 PM

## 2016-09-02 ENCOUNTER — Telehealth: Payer: Self-pay | Admitting: *Deleted

## 2016-09-02 ENCOUNTER — Telehealth: Payer: Self-pay | Admitting: Pharmacist

## 2016-09-02 LAB — COMPREHENSIVE METABOLIC PANEL
ALBUMIN: 4.5 g/dL (ref 3.5–5.5)
ALK PHOS: 106 IU/L (ref 39–117)
ALT: 9 IU/L (ref 0–32)
AST: 16 IU/L (ref 0–40)
Albumin/Globulin Ratio: 1.6 (ref 1.2–2.2)
BUN / CREAT RATIO: 13 (ref 9–23)
BUN: 14 mg/dL (ref 6–24)
Bilirubin Total: <0.2 mg/dL (ref 0.0–1.2)
CO2: 22 mmol/L (ref 20–29)
CREATININE: 1.08 mg/dL — AB (ref 0.57–1.00)
Calcium: 9.7 mg/dL (ref 8.7–10.2)
Chloride: 106 mmol/L (ref 96–106)
GFR calc Af Amer: 68 mL/min/{1.73_m2} (ref >59)
GFR calc non Af Amer: 59 mL/min/{1.73_m2} — ABNORMAL LOW (ref >59)
GLOBULIN, TOTAL: 2.8 g/dL (ref 1.5–4.5)
Glucose: 87 mg/dL (ref 65–99)
Potassium: 4.5 mmol/L (ref 3.5–5.2)
SODIUM: 141 mmol/L (ref 134–144)
Total Protein: 7.3 g/dL (ref 6.0–8.5)

## 2016-09-02 LAB — TSH: TSH: 1.49 u[IU]/mL (ref 0.450–4.500)

## 2016-09-02 NOTE — Telephone Encounter (Signed)
Received notice from insurance: methocarbamol not covered, baclofen or tizanidine preferred. Can we switch to one of the preferred?

## 2016-09-02 NOTE — Telephone Encounter (Signed)
Yes please

## 2016-09-02 NOTE — Telephone Encounter (Signed)
-----   Message from Argentina Donovan, [place] sent at 09/02/2016 10:39 AM EDT ----- Your thyroid hormone is normal.  Your kidney function is slightly abnormal but a little better than last time.  We will keep an eye on this.  Your blood sugar, liver function, and electrolytes are normal.  Follow-up as planned.  Thanks, [employer], PA-C

## 2016-09-02 NOTE — Telephone Encounter (Signed)
Medical Assistant left message on patient's home and cell voicemail. Voicemail states to give a call back to [place] with [facility] at [phone]. Patient is aware of TSH, liver, sugar level and electrolytes being normal. Patient is also aware of kidney function being slightly abnormal but better than the previous visit. Patient should follow up as planned.

## 2016-09-05 MED ORDER — TIZANIDINE HCL 2 MG PO TABS: 2.0000 mg | ORAL_TABLET | Freq: Three times a day (TID) | ORAL | 0 refills | Status: DC | PRN

## 2016-09-05 NOTE — Telephone Encounter (Signed)
Methocarbamol d/ced and tizanidine 2 mg TID PRN was ordered

## 2016-09-05 NOTE — Addendum Note (Signed)
Addended by: Rica Mast on: 09/05/2016 08:22 AM   Modules accepted: Orders

## 2016-09-07 DIAGNOSIS — F4321 Adjustment disorder with depressed mood: Secondary | ICD-10-CM | POA: Diagnosis not present

## 2016-09-08 ENCOUNTER — Ambulatory Visit (HOSPITAL_COMMUNITY)
Admission: RE | Admit: 2016-09-08 | Discharge: 2016-09-08 | Disposition: A | Discharge status: Home / Self Care | Payer: Medicare HMO | Source: Ambulatory Visit | Attending: Physician Assistant | Admitting: Physician Assistant

## 2016-09-08 DIAGNOSIS — G44201 Tension-type headache, unspecified, intractable: Secondary | ICD-10-CM | POA: Diagnosis not present

## 2016-09-08 DIAGNOSIS — G44209 Tension-type headache, unspecified, not intractable: Secondary | ICD-10-CM

## 2016-09-08 DIAGNOSIS — R51 Headache: Secondary | ICD-10-CM | POA: Diagnosis not present

## 2016-09-08 DIAGNOSIS — R930 Abnormal findings on diagnostic imaging of skull and head, not elsewhere classified: Secondary | ICD-10-CM | POA: Diagnosis not present

## 2016-09-09 ENCOUNTER — Telehealth: Payer: Self-pay

## 2016-09-14 NOTE — Telephone Encounter (Signed)
Patient was contacted again with no answer. The same detailed message which was left on the voice message. Ask patient to refer to voice mail or relay typed message to patient.

## 2016-09-14 NOTE — Telephone Encounter (Signed)
Pt. Called stating that she received a call from here PCP regarding some results. Please f/u with pt.

## 2016-09-15 DIAGNOSIS — F331 Major depressive disorder, recurrent, moderate: Secondary | ICD-10-CM | POA: Diagnosis not present

## 2016-09-26 NOTE — Progress Notes (Signed)
Patient needs appointment as soon as possible to discuss results.

## 2016-09-28 ENCOUNTER — Encounter (HOSPITAL_COMMUNITY): Payer: Self-pay | Admitting: *Deleted

## 2016-09-28 ENCOUNTER — Telehealth: Payer: Self-pay | Admitting: *Deleted

## 2016-09-28 ENCOUNTER — Emergency Department (HOSPITAL_COMMUNITY)
Admission: EM | Admit: 2016-09-28 | Discharge: 2016-09-28 | Disposition: A | Discharge status: Home / Self Care | Payer: Medicare HMO | Attending: Emergency Medicine | Admitting: Emergency Medicine

## 2016-09-28 DIAGNOSIS — Z5321 Procedure and treatment not carried out due to patient leaving prior to being seen by health care provider: Secondary | ICD-10-CM | POA: Insufficient documentation

## 2016-09-28 DIAGNOSIS — I729 Aneurysm of unspecified site: Secondary | ICD-10-CM

## 2016-09-28 DIAGNOSIS — R51 Headache: Secondary | ICD-10-CM | POA: Diagnosis present

## 2016-09-28 NOTE — ED Triage Notes (Signed)
Pt reports having ongoing headaches and has been seen at pcp. They ordered for her to have ct scan and she had it done in august. Dr office has been calling her and leaving messages, trying to inform her of results. They got in touch with pt today and notified her that she has an aneurysm. Pt still has headache and blurred vision, no new symptoms. VSS and no neuro deficits are noted at triage.

## 2016-09-28 NOTE — ED Notes (Signed)
Pt stated wait was to long she is leaving.

## 2016-09-29 ENCOUNTER — Ambulatory Visit: Payer: Medicare HMO | Admitting: Family Medicine

## 2016-09-29 ENCOUNTER — Emergency Department (HOSPITAL_COMMUNITY)
Admission: EM | Admit: 2016-09-29 | Discharge: 2016-09-29 | Disposition: A | Discharge status: Home / Self Care | Payer: Medicare HMO | Attending: Emergency Medicine | Admitting: Emergency Medicine

## 2016-09-29 ENCOUNTER — Encounter (HOSPITAL_COMMUNITY): Payer: Self-pay | Admitting: Emergency Medicine

## 2016-09-29 ENCOUNTER — Telehealth: Payer: Self-pay | Admitting: *Deleted

## 2016-09-29 DIAGNOSIS — I671 Cerebral aneurysm, nonruptured: Secondary | ICD-10-CM | POA: Insufficient documentation

## 2016-09-29 DIAGNOSIS — R51 Headache: Secondary | ICD-10-CM

## 2016-09-29 DIAGNOSIS — H538 Other visual disturbances: Secondary | ICD-10-CM

## 2016-09-29 DIAGNOSIS — R2681 Unsteadiness on feet: Secondary | ICD-10-CM

## 2016-09-29 DIAGNOSIS — Z79899 Other long term (current) drug therapy: Secondary | ICD-10-CM | POA: Insufficient documentation

## 2016-09-29 DIAGNOSIS — F17210 Nicotine dependence, cigarettes, uncomplicated: Secondary | ICD-10-CM | POA: Diagnosis not present

## 2016-09-29 DIAGNOSIS — J45909 Unspecified asthma, uncomplicated: Secondary | ICD-10-CM | POA: Diagnosis not present

## 2016-09-29 DIAGNOSIS — I10 Essential (primary) hypertension: Secondary | ICD-10-CM | POA: Insufficient documentation

## 2016-09-29 DIAGNOSIS — I729 Aneurysm of unspecified site: Secondary | ICD-10-CM | POA: Insufficient documentation

## 2016-09-29 DIAGNOSIS — R519 Headache, unspecified: Secondary | ICD-10-CM

## 2016-09-29 LAB — COMPREHENSIVE METABOLIC PANEL
ALT: 13 U/L — AB (ref 14–54)
AST: 21 U/L (ref 15–41)
Albumin: 4.2 g/dL (ref 3.5–5.0)
Alkaline Phosphatase: 99 U/L (ref 38–126)
Anion gap: 9 (ref 5–15)
BUN: 11 mg/dL (ref 6–20)
CALCIUM: 9.3 mg/dL (ref 8.9–10.3)
CHLORIDE: 106 mmol/L (ref 101–111)
CO2: 24 mmol/L (ref 22–32)
CREATININE: 1.17 mg/dL — AB (ref 0.44–1.00)
GFR, EST NON AFRICAN AMERICAN: 52 mL/min — AB (ref >60)
Glucose, Bld: 178 mg/dL — ABNORMAL HIGH (ref 65–99)
Potassium: 3.8 mmol/L (ref 3.5–5.1)
SODIUM: 139 mmol/L (ref 135–145)
TOTAL PROTEIN: 7.4 g/dL (ref 6.5–8.1)
Total Bilirubin: 0.5 mg/dL (ref 0.3–1.2)

## 2016-09-29 LAB — CBC
HCT: 39.3 % (ref 36.0–46.0)
Hemoglobin: 13.1 g/dL (ref 12.0–15.0)
MCH: 28.7 pg (ref 26.0–34.0)
MCHC: 33.3 g/dL (ref 30.0–36.0)
MCV: 86 fL (ref 78.0–100.0)
PLATELETS: 264 10*3/uL (ref 150–400)
RBC: 4.57 MIL/uL (ref 3.87–5.11)
RDW: 15.7 % — ABNORMAL HIGH (ref 11.5–15.5)
WBC: 8.2 10*3/uL (ref 4.0–10.5)

## 2016-09-29 NOTE — ED Provider Notes (Signed)
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  Pt was referred to neurosurgery.  She has a trip planned and is primarily concerned if she can travel or not. No acute sx today.  Pt has an appointment on Monday.   We spoke with Dr Sherwood Gambler who does not recommend that the patient travel and she should go to her appointment on Monday.  Medical screening examination/treatment/procedure(s) were conducted as a shared visit with non-physician practitioner(s) and myself.  I personally evaluated the patient during the encounter.     Dorie Rank, MD 09/29/16 [phone]

## 2016-09-29 NOTE — Telephone Encounter (Signed)
Patient verified DOB MA spoke with patients daughter regarding CT results and next step. Patient was advised to report to the ED for a review of the scan and advise on neurosurgery or monitoring of the condition. Patient became frustrated and left the ED after being told of the 6 hour wait. MA contacted [place] neurosurgery to request an ON CALL provider review the scan and advise if a STAT appointment is necessary or if the patient is able to go on her plane ride this Sunday 10/02/16 and return for a follow up with neurosurgery.  MA will contact the patient and daughter back once the ON CALL provider reports back.

## 2016-09-29 NOTE — Telephone Encounter (Signed)
Patient is aware of [place] neurosurgery scheduling her for an appointment on 10/03/16 at 9:00am. Patient is going to report to [facility] ED after her 12 o'clock appointment to be advised on the aneurysm and if she can fly out on Sunday. MA will contact the [place] neurosurgeon to reschedule the Monday appointment if patient is cleared for her trip on Sunday. No further questions at this time.

## 2016-09-29 NOTE — ED Provider Notes (Signed)
[facility] DEPT Provider Note   CSN: [id number] Arrival date & time: 09/29/16  1218     History   Chief Complaint Chief Complaint  Patient presents with  . Headache  . Blurred Vision    HPI Crystal Stafford is a 56 y.o. female w PMHx of asthma, depression, GERD, HTN, and recent diagnosis of aneurysm in left ICA, presenting to ED with persistent intermittent throbbing headache that began in august. She has unchanging assoc symptoms of blurred vision, eye pain with focusing, and unsteady gait. Pt was seen for this headache by her PCP in august and had an outpt CT head wo contrast done on 09/08/16 showing a left ICA aneurysm measuring 20x19x68mm. She has an appointment scheduled with Neurosurgery on Monday for follow up. She reports today for advice only regarding whether or not she can travel this weekend. She is scheduled to fly to [facility] on Sunday for a six-day cruise to return the following Saturday. She would like to know if she is safe to take this trip, or if she needs to stay home. She states her PCP recommended she reported to the ER for further workup and recommendations for travel. She denies photophobia, phonophobia, nausea, N/T, weakness in extremities, or any other complaints today.  The history is provided by the patient.   Past Medical History:  Diagnosis Date  . Arthritis   . Asthma   . Depression   . GERD (gastroesophageal reflux disease)   . Hypertension   . MRSA (methicillin resistant Staphylococcus aureus)     Patient Active Problem List   Diagnosis Date Noted  . Aneurysm ([place]) 09/29/2016  . 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 Hospital discharge follow-up 10/23/2014  . PICC (peripherally inserted central catheter) removal 10/23/2014  . Sepsis ([place])   . Leukocytosis 10/10/2014  . SIRS (systemic inflammatory response syndrome) ([place]) 10/10/2014  . Impaired glucose tolerance 10/10/2014  . Myalgia   . Essential hypertension 10/21/2013  . Midline  low back pain with left-sided sciatica 10/21/2013  . Breast cancer screening 10/21/2013  . Gastroesophageal reflux disease without esophagitis 10/21/2013  . Encounter for screening mammogram for malignant neoplasm of breast 10/21/2013  . Numbness and tingling 05/20/2013  . Shoulder pain, right 02/18/2013  . Morbid obesity ([place]) 02/18/2013  . Physical exam, routine 10/25/2012  . Allergy 10/25/2012  . Hypertension 04/06/2012  . Prediabetes 04/06/2012  . Peptic ulcer 04/06/2012  . INSOMNIA 02/04/2010  . SEBACEOUS CYST, SCALP 08/21/2009  . HYPERLIPIDEMIA 06/02/2009  . HEADACHE 06/02/2009  . DEPRESSION 05/19/2009  . FATIGUE 05/19/2009  . 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 TOBACCO ABUSE 11/24/2006  . PELVIC PAIN, RIGHT 11/24/2006  . ASTHMA 11/10/2005    Past Surgical History:  Procedure Laterality Date  . ABDOMINAL HYSTERECTOMY      OB History    No data available       Home Medications    Prior to Admission medications   Medication Sig Start Date End Date Taking? Authorizing Provider  escitalopram (LEXAPRO) 10 MG tablet Take 10 mg by mouth daily.   Yes [provider]  esomeprazole (NEXIUM) 40 MG capsule Take 1 capsule (40 mg total) by mouth daily. Patient taking differently: Take 40 mg by mouth daily as needed (reflux.).  11/02/15  Yes Langeland, Dawn T, MD  gabapentin (NEURONTIN) 100 MG capsule Take 2 capsules (200 mg total) by mouth 3 (three) times daily. 11/02/15  Yes Maren Reamer, MD  hydrOXYzine (ATARAX/VISTARIL) 25 MG tablet  Take 25 mg by mouth at bedtime as needed for anxiety.    Yes [provider]  ibuprofen (ADVIL,MOTRIN) 800 MG tablet 1 tablet with food X 7 days then prn headache 09/01/16  Yes McClung, Angela M, PA-C  Multiple Vitamin (MULTIVITAMIN WITH MINERALS) TABS tablet Take 1 tablet by mouth daily. Gummy   Yes [provider]  tiZANidine (ZANAFLEX) 2 MG tablet Take 1 tablet (2 mg total) by mouth every 8 (eight)  hours as needed for muscle spasms. 09/05/16  Yes Tresa Garter, MD    Family History Family History  Problem Relation Age of Onset  . Colon cancer Maternal Grandfather 44  . Breast cancer Maternal Aunt     Social History Social History  Substance Use Topics  . Smoking status: Current Every Day Smoker    Packs/day: 1.00    Types: Cigarettes  . Smokeless tobacco: Never Used  . Alcohol use No     Allergies   Doxycycline   Review of Systems Review of Systems  Constitutional: Negative for fever.  Eyes: Positive for visual disturbance. Negative for photophobia.  Respiratory: Negative for shortness of breath.   Cardiovascular: Negative for chest pain.  Gastrointestinal: Negative for nausea.  Neurological: Positive for headaches. Negative for syncope, speech difficulty and numbness.       Unsteady gait  Psychiatric/Behavioral: Negative for confusion.  All other systems reviewed and are negative.    Physical Exam Updated Vital Signs BP 114/68   Pulse 82   Temp 99.3 F (37.4 C) (Oral)   Resp 18   Ht 5\' 4"  (1.626 m)   Wt 76.2 kg (168 lb)   SpO2 98%   BMI 28.84 kg/m   Physical Exam  Constitutional: She is oriented to person, place, and time. She appears well-developed and well-nourished. No distress.  HENT:  Head: Normocephalic and atraumatic.  Mouth/Throat: Oropharynx is clear and moist.  Eyes: Pupils are equal, round, and reactive to light. Conjunctivae and EOM are normal.  Neck: Normal range of motion. Neck supple.  Cardiovascular: Normal rate, regular rhythm, normal heart sounds and intact distal pulses.   Pulmonary/Chest: Effort normal and breath sounds normal. No respiratory distress.  Abdominal: Soft. Bowel sounds are normal. She exhibits no distension. There is no tenderness.  Neurological: She is alert and oriented to person, place, and time.  Mental Status:  Alert, oriented, thought content appropriate, able to give a coherent history. Speech fluent  without evidence of aphasia. Able to follow 2 step commands without difficulty.  Cranial Nerves:  II:  Peripheral visual fields grossly normal, pupils equal, round, reactive to light III,IV, VI: ptosis not present, extra-ocular motions intact bilaterally  V,VII: smile symmetric, facial light touch sensation equal VIII: hearing grossly normal to voice  X: uvula elevates symmetrically  XI: bilateral shoulder shrug symmetric and strong XII: midline tongue extension without fassiculations Motor:  Normal tone. 5/5 in upper and lower extremities bilaterally including strong and equal grip strength and dorsiflexion/plantar flexion Sensory: Pinprick and light touch normal in all extremities.  Deep Tendon Reflexes: 2+ and symmetric in the biceps and patella Cerebellar: pt searching with finger-to-nose with bilateral upper extremities; arm not lowering during rhomberg however pt swaying with eyes closed. Gait: unsteady gait and balance CV: distal pulses palpable throughout    Skin: Skin is warm.  Psychiatric: She has a normal mood and affect. Her behavior is normal.  Nursing note and vitals reviewed.    ED Treatments / Results  Labs (all labs ordered  are listed, but only abnormal results are displayed) Labs Reviewed  COMPREHENSIVE METABOLIC PANEL - Abnormal; Notable for the following:       Result Value   Glucose, Bld 178 (*)    Creatinine, Ser 1.17 (*)    ALT 13 (*)    GFR calc non Af Amer 52 (*)    All other components within normal limits  CBC - Abnormal; Notable for the following:    RDW 15.7 (*)    All other components within normal limits    EKG  EKG Interpretation None       Radiology No results found.  Procedures Procedures (including critical care time)  Medications Ordered in ED Medications - No data to display   Initial Impression / Assessment and Plan / ED Course  I have reviewed the triage vital signs and the nursing notes.  Pertinent labs & imaging  results that were available during my care of the patient were reviewed by me and considered in my medical decision making (see chart for details).     Patient presenting for medical advice regarding ability to travel this weekend, after recent diagnosis of left ICA aneurysm on 09/08/2016 via CT scan. Patient with no changes in symptoms today. Patient with neurosurgery appointment on Monday for further workup of diagnosis. Dr. Sherwood Gambler with neurosurgery consulted who does not recommend patient travel at this time, and stresses importance of attending appointment on Monday with vascular neurosurgeon. He recommends pt does not need any further workup in the ED today. Provided education to patient regarding aneurysms, and importance of follow-up. Answered questions to the best my ability. Strict return precautions discussed. Patient is safe for discharge.  Patient discussed with and seen by Dr. Tomi Bamberger.  Discussed results, findings, treatment and follow up. Patient advised of return precautions. Patient verbalized understanding and agreed with plan.   Final Clinical Impressions(s) / ED Diagnoses   Final diagnoses:  Intracranial aneurysm  Bad headache  Blurry vision  Unsteady gait    New Prescriptions New Prescriptions   No medications on file     Russo, [place] N, PA-C 09/29/16 1640

## 2016-09-29 NOTE — ED Triage Notes (Signed)
Per daughter pt sent by PCP for evaluation of positive aneurysm as imaged 09/08/16. Scheduled for neurologist on Monday, but scheduled to leave for cruise Sunday; wants to know if safe to go on trip. Complaint of headache, burred vision, and stumbling gait. Pt denies other.

## 2016-09-29 NOTE — ED Notes (Signed)
Spoke with Roderic Palau, MD regarding pt status and complaint; verbal orders placed.

## 2016-09-29 NOTE — Discharge Instructions (Signed)
Please follow up with your primary care provider.  It is important you attend your appointment with the neurosurgeon on Monday. Return to the ER for new or worsening symptoms.

## 2016-09-29 NOTE — ED Notes (Signed)
ED Provider at bedside. 

## 2016-09-29 NOTE — ED Notes (Signed)
Bed: [facility] Expected date:  Expected time:  Means of arrival:  Comments: 

## 2016-10-03 DIAGNOSIS — I10 Essential (primary) hypertension: Secondary | ICD-10-CM | POA: Diagnosis not present

## 2016-10-03 DIAGNOSIS — I671 Cerebral aneurysm, nonruptured: Secondary | ICD-10-CM | POA: Diagnosis not present

## 2016-10-03 DIAGNOSIS — Z6829 Body mass index (BMI) 29.0-29.9, adult: Secondary | ICD-10-CM | POA: Diagnosis not present

## 2016-10-06 ENCOUNTER — Other Ambulatory Visit: Payer: Self-pay | Admitting: Neurosurgery

## 2016-10-06 DIAGNOSIS — I671 Cerebral aneurysm, nonruptured: Secondary | ICD-10-CM

## 2016-10-19 ENCOUNTER — Inpatient Hospital Stay (HOSPITAL_COMMUNITY): Admission: RE | Admit: 2016-10-19 | Payer: Medicare HMO | Source: Ambulatory Visit

## 2016-10-20 ENCOUNTER — Encounter (HOSPITAL_COMMUNITY): Payer: Self-pay | Admitting: Neurosurgery

## 2016-10-20 ENCOUNTER — Other Ambulatory Visit: Payer: Self-pay | Admitting: Neurosurgery

## 2016-10-20 ENCOUNTER — Ambulatory Visit (HOSPITAL_COMMUNITY)
Admission: RE | Admit: 2016-10-20 | Discharge: 2016-10-20 | Disposition: A | Discharge status: Home / Self Care | Payer: Medicare HMO | Source: Ambulatory Visit | Attending: Neurosurgery | Admitting: Neurosurgery

## 2016-10-20 DIAGNOSIS — I671 Cerebral aneurysm, nonruptured: Secondary | ICD-10-CM | POA: Diagnosis not present

## 2016-10-20 DIAGNOSIS — K219 Gastro-esophageal reflux disease without esophagitis: Secondary | ICD-10-CM | POA: Insufficient documentation

## 2016-10-20 DIAGNOSIS — Z8614 Personal history of Methicillin resistant Staphylococcus aureus infection: Secondary | ICD-10-CM | POA: Insufficient documentation

## 2016-10-20 DIAGNOSIS — F17210 Nicotine dependence, cigarettes, uncomplicated: Secondary | ICD-10-CM | POA: Diagnosis not present

## 2016-10-20 DIAGNOSIS — R51 Headache: Secondary | ICD-10-CM | POA: Diagnosis present

## 2016-10-20 DIAGNOSIS — J45909 Unspecified asthma, uncomplicated: Secondary | ICD-10-CM | POA: Diagnosis not present

## 2016-10-20 DIAGNOSIS — F329 Major depressive disorder, single episode, unspecified: Secondary | ICD-10-CM | POA: Diagnosis not present

## 2016-10-20 DIAGNOSIS — I10 Essential (primary) hypertension: Secondary | ICD-10-CM | POA: Diagnosis not present

## 2016-10-20 DIAGNOSIS — M1990 Unspecified osteoarthritis, unspecified site: Secondary | ICD-10-CM | POA: Diagnosis not present

## 2016-10-20 HISTORY — PX: IR ANGIO VERTEBRAL SEL VERTEBRAL BILAT MOD SED: IMG5369

## 2016-10-20 HISTORY — PX: IR ANGIO INTRA EXTRACRAN SEL INTERNAL CAROTID BILAT MOD SED: IMG5363

## 2016-10-20 LAB — BASIC METABOLIC PANEL
ANION GAP: 9 (ref 5–15)
BUN: 9 mg/dL (ref 6–20)
CALCIUM: 9.5 mg/dL (ref 8.9–10.3)
CO2: 23 mmol/L (ref 22–32)
CREATININE: 1.14 mg/dL — AB (ref 0.44–1.00)
Chloride: 107 mmol/L (ref 101–111)
GFR calc Af Amer: >60 mL/min (ref >60)
GFR calc non Af Amer: 54 mL/min — ABNORMAL LOW (ref >60)
GLUCOSE: 93 mg/dL (ref 65–99)
Potassium: 4.1 mmol/L (ref 3.5–5.1)
Sodium: 139 mmol/L (ref 135–145)

## 2016-10-20 LAB — CBC WITH DIFFERENTIAL/PLATELET
BASOS PCT: 0 %
Basophils Absolute: 0 10*3/uL (ref 0.0–0.1)
Eosinophils Absolute: 0.1 10*3/uL (ref 0.0–0.7)
Eosinophils Relative: 2 %
HEMATOCRIT: 41.5 % (ref 36.0–46.0)
HEMOGLOBIN: 13.6 g/dL (ref 12.0–15.0)
LYMPHS ABS: 3.8 10*3/uL (ref 0.7–4.0)
Lymphocytes Relative: 50 %
MCH: 28.7 pg (ref 26.0–34.0)
MCHC: 32.8 g/dL (ref 30.0–36.0)
MCV: 87.6 fL (ref 78.0–100.0)
MONO ABS: 0.5 10*3/uL (ref 0.1–1.0)
MONOS PCT: 6 %
NEUTROS ABS: 3.2 10*3/uL (ref 1.7–7.7)
NEUTROS PCT: 42 %
Platelets: 269 10*3/uL (ref 150–400)
RBC: 4.74 MIL/uL (ref 3.87–5.11)
RDW: 15.4 % (ref 11.5–15.5)
WBC: 7.7 10*3/uL (ref 4.0–10.5)

## 2016-10-20 LAB — PROTIME-INR
INR: 1.01
Prothrombin Time: 13.2 seconds (ref 11.4–15.2)

## 2016-10-20 LAB — APTT: aPTT: 28 seconds (ref 24–36)

## 2016-10-20 MED ORDER — FENTANYL CITRATE (PF) 100 MCG/2ML IJ SOLN
INTRAMUSCULAR | Status: DC | PRN
  Administered 2016-10-20 (×2): 25 ug via INTRAVENOUS

## 2016-10-20 MED ORDER — SODIUM CHLORIDE 0.9 % IV SOLN
INTRAVENOUS | Status: DC
  Administered 2016-10-20: 13:00:00 via INTRAVENOUS

## 2016-10-20 MED ORDER — HEPARIN SODIUM (PORCINE) 1000 UNIT/ML IJ SOLN
INTRAMUSCULAR | Status: DC | PRN
  Administered 2016-10-20: 2000 [IU] via INTRAVENOUS

## 2016-10-20 MED ORDER — LIDOCAINE HCL 1 % IJ SOLN
INTRAMUSCULAR | Status: AC
  Filled 2016-10-20: qty 20

## 2016-10-20 MED ORDER — HYDROCODONE-ACETAMINOPHEN 5-325 MG PO TABS: 1.0000 | ORAL_TABLET | ORAL | Status: DC | PRN

## 2016-10-20 MED ORDER — LIDOCAINE HCL (PF) 1 % IJ SOLN
INTRAMUSCULAR | Status: DC | PRN
  Administered 2016-10-20: 5 mL

## 2016-10-20 MED ORDER — FENTANYL CITRATE (PF) 100 MCG/2ML IJ SOLN
INTRAMUSCULAR | Status: AC
Start: 2016-10-20 — End: 2016-10-20
  Filled 2016-10-20: qty 2

## 2016-10-20 MED ORDER — IOPAMIDOL (ISOVUE-300) INJECTION 61%
INTRAVENOUS | Status: DC
Start: 2016-10-20 — End: 2016-10-20
  Filled 2016-10-20: qty 100

## 2016-10-20 MED ORDER — IOPAMIDOL (ISOVUE-300) INJECTION 61%
INTRAVENOUS | Status: AC
  Administered 2016-10-20: 55 mL
  Filled 2016-10-20: qty 150

## 2016-10-20 MED ORDER — IOPAMIDOL (ISOVUE-300) INJECTION 61%
INTRAVENOUS | Status: AC
  Filled 2016-10-20: qty 50

## 2016-10-20 MED ORDER — MIDAZOLAM HCL 2 MG/2ML IJ SOLN
INTRAMUSCULAR | Status: DC | PRN
  Administered 2016-10-20: 1 mg via INTRAVENOUS

## 2016-10-20 MED ORDER — CEFAZOLIN SODIUM-DEXTROSE 2-4 GM/100ML-% IV SOLN: 2.0000 g | INTRAVENOUS | Status: DC

## 2016-10-20 MED ORDER — MIDAZOLAM HCL 2 MG/2ML IJ SOLN
INTRAMUSCULAR | Status: AC
  Filled 2016-10-20: qty 2

## 2016-10-20 MED ORDER — HEPARIN SODIUM (PORCINE) 1000 UNIT/ML IJ SOLN
INTRAMUSCULAR | Status: AC
  Filled 2016-10-20: qty 1

## 2016-10-20 NOTE — Sedation Documentation (Signed)
Patient is resting comfortably. Report given to [facility] RN

## 2016-10-20 NOTE — Sedation Documentation (Signed)
Pt is very anxious, MD aware. Procedure started

## 2016-10-20 NOTE — H&P (Signed)
  Chief Complaint   Aneurysm  History of Present Illness  Crystal Stafford is a 56 y.o. female initially seen in the outpatient clinic with incidental discovery of a right sided aneurysm after CT scan was done for headache with blurry vision. She therefore presents today for further w/u with diagnostic cerebral angiogram.  Past Medical History   Past Medical History:  Diagnosis Date  . Arthritis   . Asthma   . Depression   . GERD (gastroesophageal reflux disease)   . Hypertension   . MRSA (methicillin resistant Staphylococcus aureus)     Past Surgical History   Past Surgical History:  Procedure Laterality Date  . ABDOMINAL HYSTERECTOMY      Social History   Social History  Substance Use Topics  . Smoking status: Current Every Day Smoker    Packs/day: 1.00    Types: Cigarettes  . Smokeless tobacco: Never Used  . Alcohol use No    Medications   Prior to Admission medications   Medication Sig Start Date End Date Taking? Authorizing Provider  nicotine (NICODERM CQ - DOSED IN MG/24 HR) 7 mg/24hr patch Place 7 mg onto the skin daily.   Yes [provider]  esomeprazole (NEXIUM) 40 MG capsule Take 1 capsule (40 mg total) by mouth daily. Patient taking differently: Take 40 mg by mouth daily as needed (reflux.).  11/02/15   Maren Reamer, MD  gabapentin (NEURONTIN) 100 MG capsule Take 2 capsules (200 mg total) by mouth 3 (three) times daily. 11/02/15   Maren Reamer, MD  ibuprofen (ADVIL,MOTRIN) 800 MG tablet 1 tablet with food X 7 days then prn headache 09/01/16   Freeman Caldron M, PA-C  tiZANidine (ZANAFLEX) 2 MG tablet Take 1 tablet (2 mg total) by mouth every 8 (eight) hours as needed for muscle spasms. 09/05/16   Tresa Garter, MD    Allergies   Allergies  Allergen Reactions  . Doxycycline Shortness Of Breath, Swelling and Rash    Face swelling  . Tape Rash    Review of Systems  ROS  Neurologic Exam  Awake, alert, oriented Memory and  concentration grossly intact Speech fluent, appropriate CN grossly intact Motor exam: Upper Extremities Deltoid Bicep Tricep Grip  Right 5/5 5/5 5/5 5/5  Left 5/5 5/5 5/5 5/5   Lower Extremities IP Quad PF DF EHL  Right 5/5 5/5 5/5 5/5 5/5  Left 5/5 5/5 5/5 5/5 5/5   Sensation grossly intact to LT  Imaging  CT demonstrates peripherally calcified mass in the region of the right supraclinoid ICA  Impression  - 56 y.o. female with likely large right ICA aneurysm  Plan  - Proceed with diagnostic cerebral angiogram  I have reviewed with the patient and family the indications, risks, benefits, and alternatives to angiogram. All questions were answered and consent was obtained.

## 2016-10-20 NOTE — Discharge Instructions (Signed)
Cerebral Angiogram, Care After °Refer to this sheet in the next few weeks. These instructions provide you with information on caring for yourself after your procedure. Your health care provider may also give you more specific instructions. Your treatment has been planned according to current medical practices, but problems sometimes occur. Call your health care provider if you have any problems or questions after your procedure. °What can I expect after the procedure? °After your procedure, it is typical to have the following: °· Bruising at the catheter insertion site that usually fades within 1-2 weeks. °· Blood collecting in the tissue (hematoma) that may be painful to the touch. It should usually decrease in size and tenderness within 1-2 weeks. °· A mild headache. ° °Follow these instructions at home: °· Take medicines only as directed by your health care provider. °· You may shower 24-48 hours after the procedure or as directed by your health care provider. Remove the bandage (dressing) and gently wash the site with plain soap and water. Pat the area dry with a clean towel. Do not rub the site, because this may cause bleeding. °· Do not take baths, swim, or use a hot tub until your health care provider approves. °· Check your insertion site every day for redness, swelling, or drainage. °· Do not apply powder or lotion to the site. °· Do not lift over 10 lb (4.5 kg) for 5 days after your procedure or as directed by your health care provider. °· Ask your health care provider when it is okay to: °? Return to work or school. °? Resume usual physical activities or sports. °? Resume sexual activity. °· Do not drive home if you are discharged the same day as the procedure. Have someone else drive you. °· You may drive 24 hours after the procedure unless otherwise instructed by your health care provider. °· Do not operate machinery or power tools for 24 hours after the procedure or as directed by your health care  provider. °· If your procedure was done as an outpatient procedure, which means that you went home the same day as your procedure, a responsible adult should be with you for the first 24 hours after you arrive home. °· Keep all follow-up visits as directed by your health care provider. This is important. °Contact a health care provider if: °· You have a fever. °· You have chills. °· You have increased bleeding from the catheter insertion site. Hold pressure on the site. °Get help right away if: °· You have vision changes or loss of vision. °· You have numbness or weakness on one side of your body. °· You have difficulty talking, or you have slurred speech or cannot speak (aphasia). °· You feel confused or have difficulty remembering. °· You have unusual pain at the catheter insertion site. °· You have redness, warmth, or swelling at the catheter insertion site. °· You have drainage (other than a small amount of blood on the dressing) from the catheter insertion site. °· The catheter insertion site is bleeding, and the bleeding does not stop after 30 minutes of holding steady pressure on the site. °These symptoms may represent a serious problem that is an emergency. Do not wait to see if the symptoms will go away. Get medical help right away. Call your local emergency services (911 in [place] Do not drive yourself to the hospital. °This information is not intended to replace advice given to you by your health care provider. Make sure you discuss any questions   you have with your health care provider. °Document Released: 05/13/2013 Document Revised: 06/04/2015 Document Reviewed: 01/09/2013 °Elsevier Interactive Patient Education © 2017 [employer] Inc[employer] ° °

## 2016-10-20 NOTE — Sedation Documentation (Signed)
Patient is resting comfortably. 

## 2016-10-20 NOTE — Sedation Documentation (Signed)
Pt resting. IR Tech holding pressure to rt groin at this time.

## 2016-10-20 NOTE — Sedation Documentation (Signed)
Vital signs stable. Pt is resting 

## 2016-10-20 NOTE — Sedation Documentation (Addendum)
Pt is extremely anxious  

## 2016-10-20 NOTE — Sedation Documentation (Signed)
Vital signs stable. Pt has no complaints at this time.

## 2016-10-26 DIAGNOSIS — I10 Essential (primary) hypertension: Secondary | ICD-10-CM | POA: Diagnosis not present

## 2016-10-26 DIAGNOSIS — Z6829 Body mass index (BMI) 29.0-29.9, adult: Secondary | ICD-10-CM | POA: Diagnosis not present

## 2016-10-26 DIAGNOSIS — I671 Cerebral aneurysm, nonruptured: Secondary | ICD-10-CM | POA: Diagnosis not present

## 2016-10-28 ENCOUNTER — Other Ambulatory Visit (HOSPITAL_COMMUNITY): Payer: Self-pay | Admitting: Neurosurgery

## 2016-10-28 DIAGNOSIS — Z6829 Body mass index (BMI) 29.0-29.9, adult: Secondary | ICD-10-CM | POA: Diagnosis not present

## 2016-10-28 DIAGNOSIS — I671 Cerebral aneurysm, nonruptured: Secondary | ICD-10-CM

## 2016-10-28 DIAGNOSIS — Z1231 Encounter for screening mammogram for malignant neoplasm of breast: Secondary | ICD-10-CM | POA: Diagnosis not present

## 2016-10-28 DIAGNOSIS — Z01419 Encounter for gynecological examination (general) (routine) without abnormal findings: Secondary | ICD-10-CM | POA: Diagnosis not present

## 2016-11-08 ENCOUNTER — Other Ambulatory Visit: Payer: Self-pay | Admitting: Family Medicine

## 2016-11-08 NOTE — Progress Notes (Signed)
Subjective:  Patient ID: Crystal Stafford, female    DOB: 1960/10/21  Age: 56 y.o. MRN: [id number]  CC: Establish Care   HPI ROCHELE LUECK presents to establish care.  Recent history diagnosed brain aneurysm left ICA 09/08/2016.  The upcoming surgery 12/09/2016.  Past medical history includes hypertension hyperlipidemia and depression.  She denies any family history of brain aneurysm.  She does report her mother had history of aortic aneurysm. Hypertension/HLD: She is not exercising and is not adherent to low salt diet.  She does not check BP at home. Cardiac symptoms none. Patient denies chest pain, dyspnea, palpitations and syncope.  Cardiovascular risk factors: dyslipidemia, hypertension, sedentary lifestyle and smoking/ tobacco exposure.  She is a current smoker and is interested in patches to help quit smoking.  Use of agents associated with hypertension: none. History of target organ damage: none.  Leg paresthesias: She reports history of bilateral leg tingling/numbness.  Symptoms are worse at night.  She denies any history of injury or diabetes.  She does report family history of diabetes-maternal aunts.  She reports taking gabapentin for symptoms.  Urine incontinence: She reports history of urinary incontinence for greater than 3 months.  She reports symptoms of urgency and urine leakage.  She denies any foul-smelling urine or hematuria.  Depression: She reports history of depression.  She reports currently receiving outpatient therapy at family services at the [place].  She declines any medication at this time.  She denies any SI/HI.  She denies speaking with the LCSW today.    Outpatient Medications Prior to Visit  Medication Sig Dispense Refill  . esomeprazole (NEXIUM) 40 MG capsule Take 1 capsule (40 mg total) by mouth daily. (Patient taking differently: Take 40 mg by mouth daily as needed (reflux.). ) 30 capsule 3  . gabapentin (NEURONTIN) 100 MG capsule Take 2 capsules (200 mg total) by  mouth 3 (three) times daily. 120 capsule 2  . ibuprofen (ADVIL,MOTRIN) 800 MG tablet 1 tablet with food X 7 days then prn headache 60 tablet 0  . tiZANidine (ZANAFLEX) 2 MG tablet Take 1 tablet (2 mg total) by mouth every 8 (eight) hours as needed for muscle spasms. 90 tablet 0  . nicotine (NICODERM CQ - DOSED IN MG/24 HR) 7 mg/24hr patch Place 7 mg onto the skin daily.     No facility-administered medications prior to visit.     ROS Review of Systems  Constitutional: Negative.   Respiratory: Negative.   Cardiovascular: Negative.   Genitourinary: Positive for urgency.       Incontinence  Psychiatric/Behavioral: Negative for suicidal ideas.       History of depression.       Objective:  BP 115/76 (BP Location: Left Arm, Patient Position: Sitting, Cuff Size: Normal)   Pulse (!) 106   Temp 99 F (37.2 C) (Oral)   Resp 18   Ht 5\' 4"  (1.626 m)   Wt 172 lb 6.4 oz (78.2 kg)   SpO2 99%   BMI 29.59 kg/m   BP/Weight 11/09/2016 10/20/2016 09/07/9369  Systolic BP 696 789 381  Diastolic BP 76 81 68  Wt. (Lbs) 172.4 185 168  BMI 29.59 31.76 28.84     Physical Exam  Constitutional: She appears well-developed and well-nourished.  HENT:  Head: Normocephalic and atraumatic.  Right Ear: External ear normal.  Left Ear: External ear normal.  Nose: Nose normal.  Mouth/Throat: Oropharynx is clear and moist.  Eyes: Pupils are equal, round, and reactive to light. Conjunctivae  and EOM are normal.  Neck: No JVD present.  Cardiovascular: Normal rate, regular rhythm, normal heart sounds and intact distal pulses.   Pulmonary/Chest: Effort normal and breath sounds normal.  Abdominal: Soft. Bowel sounds are normal. There is tenderness (suprapubic).  Musculoskeletal: Normal range of motion. She exhibits no edema.  Neurological: She is alert.  Skin: Skin is warm and dry.  Psychiatric: She exhibits a depressed mood. She expresses no homicidal and no suicidal ideation. She expresses no suicidal  plans and no homicidal plans.  Nursing note and vitals reviewed.    Assessment & Plan:   1. History of hypertension  - EKG 12-Lead  2. Suprapubic tenderness  - Urinalysis Dipstick  3. Numbness and tingling of both lower extremities  - POCT ABI Screening for Pilot No Charge - POCT glycosylated hemoglobin (Hb A1C)  4. Urge incontinence of urine  - Urinalysis Dipstick  5. Dyslipidemia  - Lipid Panel - CMP and Liver  6. Pre-operative clearance  - EKG 12-Lead - Lipid Panel - CBC - CMP and Liver  7. Current smoker  - nicotine (NICODERM CQ - DOSED IN MG/24 HOURS) 21 mg/24hr patch; Place 1 patch (21 mg total) onto the skin daily.  Dispense: 28 patch; Refill: 1  8. Acute cystitis with hematuria  - nitrofurantoin, macrocrystal-monohydrate, (MACROBID) 100 MG capsule; Take 1 capsule (100 mg total) by mouth 2 (two) times daily.  Dispense: 10 capsule; Refill: 0  9. Prediabetes Start metformin.  Encourage dietary changes. - metFORMIN (GLUCOPHAGE-XR) 500 MG 24 hr tablet; Take 1 tablet (500 mg total) by mouth daily with breakfast.  Dispense: 30 tablet; Refill: 2     Follow-up: Return in about 3 months (around 02/09/2017), or if symptoms worsen or fail to improve, for Follow Up.   Alfonse Spruce FNP

## 2016-11-09 ENCOUNTER — Encounter: Payer: Self-pay | Admitting: Family Medicine

## 2016-11-09 ENCOUNTER — Other Ambulatory Visit: Payer: Self-pay

## 2016-11-09 ENCOUNTER — Ambulatory Visit: Payer: Medicare HMO | Attending: Family Medicine | Admitting: Family Medicine

## 2016-11-09 VITALS — BP 115/76 | HR 106 | Temp 99.0°F | Resp 18 | Ht 64.0 in | Wt 172.4 lb

## 2016-11-09 DIAGNOSIS — F329 Major depressive disorder, single episode, unspecified: Secondary | ICD-10-CM | POA: Insufficient documentation

## 2016-11-09 DIAGNOSIS — Z8679 Personal history of other diseases of the circulatory system: Secondary | ICD-10-CM | POA: Diagnosis not present

## 2016-11-09 DIAGNOSIS — N3941 Urge incontinence: Secondary | ICD-10-CM | POA: Insufficient documentation

## 2016-11-09 DIAGNOSIS — E785 Hyperlipidemia, unspecified: Secondary | ICD-10-CM | POA: Diagnosis not present

## 2016-11-09 DIAGNOSIS — R202 Paresthesia of skin: Secondary | ICD-10-CM | POA: Diagnosis not present

## 2016-11-09 DIAGNOSIS — I671 Cerebral aneurysm, nonruptured: Secondary | ICD-10-CM | POA: Diagnosis not present

## 2016-11-09 DIAGNOSIS — R200 Anesthesia of skin: Secondary | ICD-10-CM | POA: Diagnosis not present

## 2016-11-09 DIAGNOSIS — R10819 Abdominal tenderness, unspecified site: Secondary | ICD-10-CM | POA: Diagnosis not present

## 2016-11-09 DIAGNOSIS — N3001 Acute cystitis with hematuria: Secondary | ICD-10-CM | POA: Diagnosis not present

## 2016-11-09 DIAGNOSIS — Z79899 Other long term (current) drug therapy: Secondary | ICD-10-CM | POA: Diagnosis not present

## 2016-11-09 DIAGNOSIS — Z01818 Encounter for other preprocedural examination: Secondary | ICD-10-CM | POA: Diagnosis not present

## 2016-11-09 DIAGNOSIS — R7303 Prediabetes: Secondary | ICD-10-CM | POA: Insufficient documentation

## 2016-11-09 DIAGNOSIS — F17200 Nicotine dependence, unspecified, uncomplicated: Secondary | ICD-10-CM | POA: Diagnosis not present

## 2016-11-09 DIAGNOSIS — Z833 Family history of diabetes mellitus: Secondary | ICD-10-CM | POA: Insufficient documentation

## 2016-11-09 LAB — POCT URINALYSIS DIPSTICK
BILIRUBIN UA: NEGATIVE
Glucose, UA: NEGATIVE
KETONES UA: NEGATIVE
Nitrite, UA: NEGATIVE
PH UA: 6 (ref 5.0–8.0)
Protein, UA: NEGATIVE
Spec Grav, UA: 1.02 (ref 1.010–1.025)
UROBILINOGEN UA: 0.2 U/dL

## 2016-11-09 LAB — POCT GLYCOSYLATED HEMOGLOBIN (HGB A1C): Hemoglobin A1C: 6

## 2016-11-09 MED ORDER — METFORMIN HCL ER 500 MG PO TB24: 500.0000 mg | ORAL_TABLET | Freq: Every day | ORAL | 2 refills | Status: DC

## 2016-11-09 MED ORDER — NITROFURANTOIN MONOHYD MACRO 100 MG PO CAPS: 100.0000 mg | ORAL_CAPSULE | Freq: Two times a day (BID) | ORAL | 0 refills | Status: DC

## 2016-11-09 MED ORDER — NICOTINE 21 MG/24HR TD PT24: 21.0000 mg | MEDICATED_PATCH | Freq: Every day | TRANSDERMAL | 1 refills | Status: DC

## 2016-11-09 NOTE — Patient Instructions (Addendum)
Urinary Tract Infection, Adult A urinary tract infection (UTI) is an infection of any part of the urinary tract. The urinary tract includes the:  Kidneys.  Ureters.  Bladder.  Urethra.  These organs make, store, and get rid of pee (urine) in the body. Follow these instructions at home:  Take over-the-counter and prescription medicines only as told by your doctor.  If you were prescribed an antibiotic medicine, take it as told by your doctor. Do not stop taking the antibiotic even if you start to feel better.  Avoid the following drinks: ? Alcohol. ? Caffeine. ? Tea. ? Carbonated drinks.  Drink enough fluid to keep your pee clear or pale yellow.  Keep all follow-up visits as told by your doctor. This is important.  Make sure to: ? Empty your bladder often and completely. Do not to hold pee for long periods of time. ? Empty your bladder before and after sex. ? Wipe from front to back after a bowel movement if you are female. Use each tissue one time when you wipe. Contact a doctor if:  You have back pain.  You have a fever.  You feel sick to your stomach (nauseous).  You throw up (vomit).  Your symptoms do not get better after 3 days.  Your symptoms go away and then come back. Get help right away if:  You have very bad back pain.  You have very bad lower belly (abdominal) pain.  You are throwing up and cannot keep down any medicines or water. This information is not intended to replace advice given to you by your health care provider. Make sure you discuss any questions you have with your health care provider. Document Released: 06/15/2007 Document Revised: 06/04/2015 Document Reviewed: 11/17/2014 Elsevier Interactive Patient Education  2018 [employer].   Prediabetes Prediabetes is the condition of having a blood sugar (blood glucose) level that is higher than it should be, but not high enough for you to be diagnosed with type 2 diabetes. Having prediabetes  puts you at risk for developing type 2 diabetes (type 2 diabetes mellitus). Prediabetes may be called impaired glucose tolerance or impaired fasting glucose. Prediabetes usually does not cause symptoms. Your health care provider can diagnose this condition with blood tests. You may be tested for prediabetes if you are overweight and if you have at least one other risk factor for prediabetes. Risk factors for prediabetes include:  Having a family member with type 2 diabetes.  Being overweight or obese.  Being older than age 77.  Being of American-Indian, African-American, Hispanic/Latino, or Asian/Pacific Islander descent.  Having an inactive (sedentary) lifestyle.  Having a history of gestational diabetes or polycystic ovarian syndrome (PCOS).  Having low levels of good cholesterol (HDL-C) or high levels of blood fats (triglycerides).  Having high blood pressure.  What is blood glucose and how is blood glucose measured?  Blood glucose refers to the amount of glucose in your bloodstream. Glucose comes from eating foods that contain sugars and starches (carbohydrates) that the body breaks down into glucose. Your blood glucose level may be measured in mg/dL (milligrams per deciliter) or mmol/L (millimoles per liter).Your blood glucose may be checked with one or more of the following blood tests:  A fasting blood glucose (FBG) test. You will not be allowed to eat (you will fast) for at least 8 hours before a blood sample is taken. ? A normal range for FBG is 70-100 mg/dl (3.9-5.6 mmol/L).  An A1c (hemoglobin A1c) blood test. This  test provides information about blood glucose control over the previous 2?63months.  An oral glucose tolerance test (OGTT). This test measures your blood glucose twice: ? After fasting. This is your baseline level. ? Two hours after you drink a beverage that contains glucose.  You may be diagnosed with prediabetes:  If your FBG is 100?125 mg/dL (5.6-6.9  mmol/L).  If your A1c level is 5.7?6.4%.  If your OGGT result is 140?199 mg/dL (7.8-11 mmol/L).  These blood tests may be repeated to confirm your diagnosis. What happens if blood glucose is too high? The pancreas produces a hormone (insulin) that helps move glucose from the bloodstream into cells. When cells in the body do not respond properly to insulin that the body makes (insulin resistance), excess glucose builds up in the blood instead of going into cells. As a result, high blood glucose (hyperglycemia) can develop, which can cause many complications. This is a symptom of prediabetes. What can happen if blood glucose stays higher than normal for a long time? Having high blood glucose for a long time is dangerous. Too much glucose in your blood can damage your nerves and blood vessels. Long-term damage can lead to complications from diabetes, which may include:  Heart disease.  Stroke.  Blindness.  Kidney disease.  Depression.  Poor circulation in the feet and legs, which could lead to surgical removal (amputation) in severe cases.  How can prediabetes be prevented from turning into type 2 diabetes?  To help prevent type 2 diabetes, take the following actions:  Be physically active. ? Do moderate-intensity physical activity for at least 30 minutes on at least 5 days of the week, or as much as told by your health care provider. This could be brisk walking, biking, or water aerobics. ? Ask your health care provider what activities are safe for you. A mix of physical activities may be best, such as walking, swimming, cycling, and strength training.  Lose weight as told by your health care provider. ? Losing 5-7% of your body weight can reverse insulin resistance. ? Your health care provider can determine how much weight loss is best for you and can help you lose weight safely.  Follow a healthy meal plan. This includes eating lean proteins, complex carbohydrates, fresh fruits  and vegetables, low-fat dairy products, and healthy fats. ? Follow instructions from your health care provider about eating or drinking restrictions. ? Make an appointment to see a diet and nutrition specialist (registered dietitian) to help you create a healthy eating plan that is right for you.  Do not smoke or use any tobacco products, such as cigarettes, chewing tobacco, and e-cigarettes. If you need help quitting, ask your health care provider.  Take over-the-counter and prescription medicines as told by your health care provider. You may be prescribed medicines that help lower the risk of type 2 diabetes.  This information is not intended to replace advice given to you by your health care provider. Make sure you discuss any questions you have with your health care provider. Document Released: 04/20/2015 Document Revised: 06/04/2015 Document Reviewed: 02/17/2015 Elsevier Interactive Patient Education  [employer].

## 2016-11-09 NOTE — Progress Notes (Signed)
Patient complains head pain

## 2016-11-10 DIAGNOSIS — F331 Major depressive disorder, recurrent, moderate: Secondary | ICD-10-CM | POA: Diagnosis not present

## 2016-11-10 LAB — CMP AND LIVER
ALBUMIN: 4.3 g/dL (ref 3.5–5.5)
ALT: 10 IU/L (ref 0–32)
AST: 17 IU/L (ref 0–40)
Alkaline Phosphatase: 101 IU/L (ref 39–117)
BUN: 11 mg/dL (ref 6–24)
Bilirubin Total: <0.2 mg/dL (ref 0.0–1.2)
Bilirubin, Direct: 0.06 mg/dL (ref 0.00–0.40)
CALCIUM: 9.6 mg/dL (ref 8.7–10.2)
CO2: 25 mmol/L (ref 20–29)
CREATININE: 1.13 mg/dL — AB (ref 0.57–1.00)
Chloride: 103 mmol/L (ref 96–106)
GFR, EST AFRICAN AMERICAN: 64 mL/min/{1.73_m2} (ref >59)
GFR, EST NON AFRICAN AMERICAN: 55 mL/min/{1.73_m2} — AB (ref >59)
Glucose: 90 mg/dL (ref 65–99)
POTASSIUM: 4.9 mmol/L (ref 3.5–5.2)
SODIUM: 141 mmol/L (ref 134–144)
TOTAL PROTEIN: 7.1 g/dL (ref 6.0–8.5)

## 2016-11-10 LAB — CBC
HEMATOCRIT: 41.2 % (ref 34.0–46.6)
Hemoglobin: 13.1 g/dL (ref 11.1–15.9)
MCH: 28.2 pg (ref 26.6–33.0)
MCHC: 31.8 g/dL (ref 31.5–35.7)
MCV: 89 fL (ref 79–97)
PLATELETS: 270 10*3/uL (ref 150–379)
RBC: 4.65 x10E6/uL (ref 3.77–5.28)
RDW: 15.5 % — AB (ref 12.3–15.4)
WBC: 8.7 10*3/uL (ref 3.4–10.8)

## 2016-11-10 LAB — LIPID PANEL
Chol/HDL Ratio: 6.3 ratio — ABNORMAL HIGH (ref 0.0–4.4)
Cholesterol, Total: 200 mg/dL — ABNORMAL HIGH (ref 100–199)
HDL: 32 mg/dL — ABNORMAL LOW (ref >39)
LDL CALC: 117 mg/dL — AB (ref 0–99)
Triglycerides: 253 mg/dL — ABNORMAL HIGH (ref 0–149)
VLDL CHOLESTEROL CAL: 51 mg/dL — AB (ref 5–40)

## 2016-11-30 ENCOUNTER — Other Ambulatory Visit: Payer: Self-pay | Admitting: Family Medicine

## 2016-11-30 DIAGNOSIS — E782 Mixed hyperlipidemia: Secondary | ICD-10-CM

## 2016-11-30 MED ORDER — ATORVASTATIN CALCIUM 20 MG PO TABS: 20.0000 mg | ORAL_TABLET | Freq: Every day | ORAL | 2 refills | Status: DC

## 2016-12-05 ENCOUNTER — Other Ambulatory Visit: Payer: Self-pay | Admitting: Neurosurgery

## 2016-12-06 ENCOUNTER — Other Ambulatory Visit: Payer: Self-pay | Admitting: Family Medicine

## 2016-12-06 ENCOUNTER — Telehealth: Payer: Self-pay

## 2016-12-06 NOTE — Telephone Encounter (Signed)
CMA call regarding lab results   Patient Verify DOB   Patient was aware and understood  

## 2016-12-06 NOTE — Pre-Procedure Instructions (Addendum)
Progress    Crystal [place]  12/06/2016      [facility] [place], [place] - [address] [place] [address] Phone: [phone] Fax: [place], [place] [address] [place] [address] Phone: [phone] Fax: [phone]    Your procedure is scheduled on Friday November 30.  Report to [facility] Admitting at 8:30 A.M.  Call this number if you have problems the morning of surgery:  [phone]   Remember:  Do not eat food or drink liquids after midnight.  Take these medicines the morning of surgery with A SIP OF WATER:   Esomeprazole (nexium) Tizanidine (zanaflex) if needed  7 days prior to surgery STOP taking any Aleve, Naproxen, Ibuprofen, Motrin, Advil, Goody's, BC's, all herbal medications, fish oil, and all vitamins  FOLLOW your Doctor's instructions on stopping Aspirin and Plavix (clopidogrel), if no instructions have been given, please call surgeon's office.   DO NOT TAKE metformin (glucophage) the day of surgery    How to Manage Your Diabetes Before and After Surgery  Why is it important to control my blood sugar before and after surgery? . Improving blood sugar levels before and after surgery helps healing and can limit problems. . A way of improving blood sugar control is eating a healthy diet by: o  Eating less sugar and carbohydrates o  Increasing activity/exercise o  Talking with your doctor about reaching your blood sugar goals . High blood sugars (greater than 180 mg/dL) can raise your risk of infections and slow your recovery, so you will need to focus on controlling your diabetes during the weeks before surgery. . Make sure that the doctor who takes care of your diabetes knows about your planned surgery including the date and location.  How do I manage my blood sugar before surgery? . Check your blood sugar at least 4  times a day, starting 2 days before surgery, to make sure that the level is not too high or low. o Check your blood sugar the morning of your surgery when you wake up and every 2 hours until you get to the Short Stay unit. . If your blood sugar is less than 70 mg/dL, you will need to treat for low blood sugar: o Do not take insulin. o Treat a low blood sugar (less than 70 mg/dL) with  cup of clear juice (cranberry or apple), 4 glucose tablets, OR glucose gel. Recheck blood sugar in 15 minutes after treatment (to make sure it is greater than 70 mg/dL). If your blood sugar is not greater than 70 mg/dL on recheck, call [phone] o  for further instructions. . Report your blood sugar to the short stay nurse when you get to Short Stay.  . If you are admitted to the hospital after surgery: o Your blood sugar will be checked by the staff and you will probably be given insulin after surgery (instead of oral diabetes medicines) to make sure you have good blood sugar levels. o The goal for blood sugar control after surgery is 80-180 mg/dL.              Do not wear jewelry, make-up or nail polish.  Do not wear lotions, powders, or perfumes, or deoderant.  Do not shave 48 hours prior to surgery.  Men may shave face and neck.  Do not bring valuables to the hospital.  [facility] is  not responsible for any belongings or valuables.  Contacts, dentures or bridgework may not be worn into surgery.  Leave your suitcase in the car.  After surgery it may be brought to your room.  For patients admitted to the hospital, discharge time will be determined by your treatment team.  Patients discharged the day of surgery will not be allowed to drive home.   Special instructions:    [place]- Preparing For Surgery  Before surgery, you can play an important role. Because skin is not sterile, your skin needs to be as free of germs as possible. You can reduce the number of germs on your skin by  washing with CHG (chlorahexidine gluconate) Soap before surgery.  CHG is an antiseptic cleaner which kills germs and bonds with the skin to continue killing germs even after washing.  Please do not use if you have an allergy to CHG or antibacterial soaps. If your skin becomes reddened/irritated stop using the CHG.  Do not shave (including legs and underarms) for at least 48 hours prior to first CHG shower. It is OK to shave your face.  Please follow these instructions carefully.   1. Shower the NIGHT BEFORE SURGERY and the MORNING OF SURGERY with CHG.   2. If you chose to wash your hair, wash your hair first as usual with your normal shampoo.  3. After you shampoo, rinse your hair and body thoroughly to remove the shampoo.  4. Use CHG as you would any other liquid soap. You can apply CHG directly to the skin and wash gently with a scrungie or a clean washcloth.   5. Apply the CHG Soap to your body ONLY FROM THE NECK DOWN.  Do not use on open wounds or open sores. Avoid contact with your eyes, ears, mouth and genitals (private parts). Wash Face and genitals (private parts)  with your normal soap.  6. Wash thoroughly, paying special attention to the area where your surgery will be performed.  7. Thoroughly rinse your body with warm water from the neck down.  8. DO NOT shower/wash with your normal soap after using and rinsing off the CHG Soap.  9. Pat yourself dry with a CLEAN TOWEL.  10. Wear CLEAN PAJAMAS to bed the night before surgery, wear comfortable clothes the morning of surgery  11. Place CLEAN SHEETS on your bed the night of your first shower and DO NOT SLEEP WITH PETS.    Day of Surgery: Do not apply any deodorants/lotions. Please wear clean clothes to the hospital/surgery center.      Please read over the following fact sheets that you were given. Coughing and Deep Breathing and Surgical Site Infection Prevention

## 2016-12-06 NOTE — Progress Notes (Signed)
Spoke with patient & she stated that she never got her Blood pressure medication prescribe & if you prescribe she wants to be sent to [facility] off at [employer]

## 2016-12-06 NOTE — Telephone Encounter (Signed)
-----   Message from Alfonse Spruce, [place] sent at 12/06/2016  8:51 AM EST ----- Lipid levels were elevated. This can increase your risk of heart disease overtime. You will be prescribed atorvastatin. Start eating a diet low in saturated fat. Limit your intake of fried foods, red meats, and whole milk. Increase activity.  -Kidney function is stable since last check but is still decreased. Levels indicate you have chronic kidney disease stage 3.  -Continue to take your medications for blood pressure, avoid taking NSAID medications, reduce salt intake to 2 to 4 grams/day, do not smoke.  -Recommend monitoring again in 6 months. If your levels have signifcantly increased you will be referred to nephrology.  Liver function normal

## 2016-12-07 ENCOUNTER — Encounter (HOSPITAL_COMMUNITY): Payer: Self-pay

## 2016-12-07 ENCOUNTER — Encounter (HOSPITAL_COMMUNITY)
Admission: RE | Admit: 2016-12-07 | Discharge: 2016-12-07 | Disposition: A | Discharge status: Home / Self Care | Payer: Medicare HMO | Source: Ambulatory Visit | Attending: Neurosurgery | Admitting: Neurosurgery

## 2016-12-07 ENCOUNTER — Other Ambulatory Visit: Payer: Self-pay

## 2016-12-07 DIAGNOSIS — Z79899 Other long term (current) drug therapy: Secondary | ICD-10-CM | POA: Diagnosis not present

## 2016-12-07 DIAGNOSIS — H538 Other visual disturbances: Secondary | ICD-10-CM | POA: Diagnosis present

## 2016-12-07 DIAGNOSIS — J45909 Unspecified asthma, uncomplicated: Secondary | ICD-10-CM | POA: Diagnosis present

## 2016-12-07 DIAGNOSIS — K219 Gastro-esophageal reflux disease without esophagitis: Secondary | ICD-10-CM | POA: Diagnosis present

## 2016-12-07 DIAGNOSIS — E785 Hyperlipidemia, unspecified: Secondary | ICD-10-CM | POA: Diagnosis not present

## 2016-12-07 DIAGNOSIS — I10 Essential (primary) hypertension: Secondary | ICD-10-CM | POA: Diagnosis not present

## 2016-12-07 DIAGNOSIS — I671 Cerebral aneurysm, nonruptured: Secondary | ICD-10-CM | POA: Diagnosis present

## 2016-12-07 DIAGNOSIS — R51 Headache: Secondary | ICD-10-CM | POA: Diagnosis present

## 2016-12-07 DIAGNOSIS — Z7902 Long term (current) use of antithrombotics/antiplatelets: Secondary | ICD-10-CM | POA: Diagnosis not present

## 2016-12-07 DIAGNOSIS — Z8614 Personal history of Methicillin resistant Staphylococcus aureus infection: Secondary | ICD-10-CM | POA: Diagnosis not present

## 2016-12-07 DIAGNOSIS — Z91048 Other nonmedicinal substance allergy status: Secondary | ICD-10-CM | POA: Diagnosis not present

## 2016-12-07 DIAGNOSIS — Z881 Allergy status to other antibiotic agents status: Secondary | ICD-10-CM | POA: Diagnosis not present

## 2016-12-07 DIAGNOSIS — R7303 Prediabetes: Secondary | ICD-10-CM | POA: Diagnosis present

## 2016-12-07 DIAGNOSIS — Z7982 Long term (current) use of aspirin: Secondary | ICD-10-CM | POA: Diagnosis not present

## 2016-12-07 DIAGNOSIS — Z7984 Long term (current) use of oral hypoglycemic drugs: Secondary | ICD-10-CM | POA: Diagnosis not present

## 2016-12-07 DIAGNOSIS — M1990 Unspecified osteoarthritis, unspecified site: Secondary | ICD-10-CM | POA: Diagnosis present

## 2016-12-07 DIAGNOSIS — F17210 Nicotine dependence, cigarettes, uncomplicated: Secondary | ICD-10-CM | POA: Diagnosis present

## 2016-12-07 HISTORY — DX: Headache, unspecified: R51.9

## 2016-12-07 HISTORY — DX: Prediabetes: R73.03

## 2016-12-07 HISTORY — DX: Headache: R51

## 2016-12-07 LAB — CBC WITH DIFFERENTIAL/PLATELET
BASOS ABS: 0 10*3/uL (ref 0.0–0.1)
Basophils Relative: 0 %
EOS PCT: 1 %
Eosinophils Absolute: 0.1 10*3/uL (ref 0.0–0.7)
HEMATOCRIT: 42.1 % (ref 36.0–46.0)
Hemoglobin: 13.8 g/dL (ref 12.0–15.0)
LYMPHS ABS: 4 10*3/uL (ref 0.7–4.0)
LYMPHS PCT: 49 %
MCH: 28.7 pg (ref 26.0–34.0)
MCHC: 32.8 g/dL (ref 30.0–36.0)
MCV: 87.5 fL (ref 78.0–100.0)
MONO ABS: 0.5 10*3/uL (ref 0.1–1.0)
Monocytes Relative: 6 %
NEUTROS ABS: 3.6 10*3/uL (ref 1.7–7.7)
Neutrophils Relative %: 44 %
Platelets: 277 10*3/uL (ref 150–400)
RBC: 4.81 MIL/uL (ref 3.87–5.11)
RDW: 15.4 % (ref 11.5–15.5)
WBC: 8.2 10*3/uL (ref 4.0–10.5)

## 2016-12-07 LAB — BASIC METABOLIC PANEL
ANION GAP: 7 (ref 5–15)
BUN: 12 mg/dL (ref 6–20)
CHLORIDE: 108 mmol/L (ref 101–111)
CO2: 23 mmol/L (ref 22–32)
Calcium: 9.4 mg/dL (ref 8.9–10.3)
Creatinine, Ser: 1.24 mg/dL — ABNORMAL HIGH (ref 0.44–1.00)
GFR calc Af Amer: 56 mL/min — ABNORMAL LOW (ref >60)
GFR, EST NON AFRICAN AMERICAN: 48 mL/min — AB (ref >60)
GLUCOSE: 83 mg/dL (ref 65–99)
POTASSIUM: 4.2 mmol/L (ref 3.5–5.1)
Sodium: 138 mmol/L (ref 135–145)

## 2016-12-07 LAB — URINALYSIS, COMPLETE (UACMP) WITH MICROSCOPIC
Bilirubin Urine: NEGATIVE
GLUCOSE, UA: NEGATIVE mg/dL
HGB URINE DIPSTICK: NEGATIVE
KETONES UR: NEGATIVE mg/dL
NITRITE: NEGATIVE
PROTEIN: NEGATIVE mg/dL
Specific Gravity, Urine: 1.021 (ref 1.005–1.030)
pH: 5 (ref 5.0–8.0)

## 2016-12-07 LAB — APTT: APTT: 32 s (ref 24–36)

## 2016-12-07 LAB — SURGICAL PCR SCREEN
MRSA, PCR: NEGATIVE
Staphylococcus aureus: POSITIVE — AB

## 2016-12-07 LAB — PROTIME-INR
INR: 0.96
Prothrombin Time: 12.7 seconds (ref 11.4–15.2)

## 2016-12-07 LAB — GLUCOSE, CAPILLARY: Glucose-Capillary: 85 mg/dL (ref 65–99)

## 2016-12-07 MED ORDER — CHLORHEXIDINE GLUCONATE CLOTH 2 % EX PADS: 6.0000 | MEDICATED_PAD | Freq: Once | CUTANEOUS | Status: DC

## 2016-12-07 NOTE — Progress Notes (Addendum)
PCP - Fredia Beets, FNP  EKG - requested from PCP Stress Test - 2017  Pt denies cardiac history or cardiologist.   Blood Thinner Instructions: Pt states she was not given instructions on whether to stop or keep taking Plavix/Aspirin prior to surgery. Message left at Dr. Cleotilde Neer office to call patient with these instructions. Pt instructed to call office today as well to get instructions on this. Patient is forgetful, all instructions written down for her and encouraged patient to have daughter review pre-op instructions as well.  Per Lexine Baton at Dr. Cleotilde Neer office, pt to keep taking Aspirin and Plavix, Nikki to call patient to notify of this.   Anesthesia review: Follow up on EKG requested. Review previous EKG  Patient denies shortness of breath, fever, cough and chest pain at PAT appointment  Patient verbalized understanding of instructions that were given to them at the PAT appointment. Patient was also instructed that they will need to review over the PAT instructions again at home before surgery.

## 2016-12-09 ENCOUNTER — Ambulatory Visit (HOSPITAL_COMMUNITY)
Admission: RE | Admit: 2016-12-09 | Discharge: 2016-12-09 | Disposition: A | Discharge status: Home / Self Care | Payer: Medicare HMO | Source: Ambulatory Visit | Attending: Neurosurgery | Admitting: Neurosurgery

## 2016-12-09 ENCOUNTER — Inpatient Hospital Stay (HOSPITAL_COMMUNITY)
Admission: AD | Admit: 2016-12-09 | Discharge: 2016-12-10 | DRG: 027 | Disposition: A | Discharge status: Home / Self Care | Payer: Medicare HMO | Source: Ambulatory Visit | Attending: Neurosurgery | Admitting: Neurosurgery

## 2016-12-09 ENCOUNTER — Ambulatory Visit (HOSPITAL_COMMUNITY): Payer: Medicare HMO | Admitting: Emergency Medicine

## 2016-12-09 ENCOUNTER — Encounter (HOSPITAL_COMMUNITY): Payer: Self-pay

## 2016-12-09 ENCOUNTER — Encounter (HOSPITAL_COMMUNITY)
Admission: AD | Disposition: A | Discharge status: Home / Self Care | Payer: Self-pay | Source: Ambulatory Visit | Attending: Neurosurgery

## 2016-12-09 ENCOUNTER — Ambulatory Visit (HOSPITAL_COMMUNITY): Payer: Medicare HMO | Admitting: Certified Registered Nurse Anesthetist

## 2016-12-09 DIAGNOSIS — I671 Cerebral aneurysm, nonruptured: Secondary | ICD-10-CM

## 2016-12-09 DIAGNOSIS — Z8614 Personal history of Methicillin resistant Staphylococcus aureus infection: Secondary | ICD-10-CM

## 2016-12-09 DIAGNOSIS — F17210 Nicotine dependence, cigarettes, uncomplicated: Secondary | ICD-10-CM | POA: Diagnosis present

## 2016-12-09 DIAGNOSIS — J45909 Unspecified asthma, uncomplicated: Secondary | ICD-10-CM | POA: Diagnosis present

## 2016-12-09 DIAGNOSIS — Z7902 Long term (current) use of antithrombotics/antiplatelets: Secondary | ICD-10-CM

## 2016-12-09 DIAGNOSIS — Z7982 Long term (current) use of aspirin: Secondary | ICD-10-CM

## 2016-12-09 DIAGNOSIS — Z7984 Long term (current) use of oral hypoglycemic drugs: Secondary | ICD-10-CM

## 2016-12-09 DIAGNOSIS — M1990 Unspecified osteoarthritis, unspecified site: Secondary | ICD-10-CM | POA: Diagnosis present

## 2016-12-09 DIAGNOSIS — R7303 Prediabetes: Secondary | ICD-10-CM | POA: Diagnosis present

## 2016-12-09 DIAGNOSIS — Z881 Allergy status to other antibiotic agents status: Secondary | ICD-10-CM

## 2016-12-09 DIAGNOSIS — H538 Other visual disturbances: Secondary | ICD-10-CM | POA: Diagnosis present

## 2016-12-09 DIAGNOSIS — K219 Gastro-esophageal reflux disease without esophagitis: Secondary | ICD-10-CM | POA: Diagnosis present

## 2016-12-09 DIAGNOSIS — Z91048 Other nonmedicinal substance allergy status: Secondary | ICD-10-CM | POA: Diagnosis not present

## 2016-12-09 DIAGNOSIS — Z79899 Other long term (current) drug therapy: Secondary | ICD-10-CM

## 2016-12-09 DIAGNOSIS — R51 Headache: Secondary | ICD-10-CM | POA: Diagnosis present

## 2016-12-09 HISTORY — PX: IR ANGIO INTRA EXTRACRAN SEL INTERNAL CAROTID BILAT MOD SED: IMG5363

## 2016-12-09 HISTORY — PX: IR TRANSCATH/EMBOLIZ: IMG695

## 2016-12-09 HISTORY — PX: IR ANGIOGRAM FOLLOW UP STUDY: IMG697

## 2016-12-09 HISTORY — PX: RADIOLOGY WITH ANESTHESIA: SHX6223

## 2016-12-09 LAB — GLUCOSE, CAPILLARY
GLUCOSE-CAPILLARY: 104 mg/dL — AB (ref 65–99)
GLUCOSE-CAPILLARY: 136 mg/dL — AB (ref 65–99)
GLUCOSE-CAPILLARY: 145 mg/dL — AB (ref 65–99)
Glucose-Capillary: 115 mg/dL — ABNORMAL HIGH (ref 65–99)

## 2016-12-09 LAB — CBC
HCT: 32.9 % — ABNORMAL LOW (ref 36.0–46.0)
HEMOGLOBIN: 10.8 g/dL — AB (ref 12.0–15.0)
MCH: 28.5 pg (ref 26.0–34.0)
MCHC: 32.8 g/dL (ref 30.0–36.0)
MCV: 86.8 fL (ref 78.0–100.0)
Platelets: 240 10*3/uL (ref 150–400)
RBC: 3.79 MIL/uL — ABNORMAL LOW (ref 3.87–5.11)
RDW: 15.3 % (ref 11.5–15.5)
WBC: 9.2 10*3/uL (ref 4.0–10.5)

## 2016-12-09 LAB — CREATININE, SERUM
CREATININE: 1.08 mg/dL — AB (ref 0.44–1.00)
GFR calc Af Amer: >60 mL/min (ref >60)
GFR calc non Af Amer: 57 mL/min — ABNORMAL LOW (ref >60)

## 2016-12-09 LAB — MRSA PCR SCREENING: MRSA BY PCR: NEGATIVE

## 2016-12-09 SURGERY — IR WITH ANESTHESIA
Anesthesia: General

## 2016-12-09 MED ORDER — DEXAMETHASONE SODIUM PHOSPHATE 10 MG/ML IJ SOLN
INTRAMUSCULAR | Status: AC
  Filled 2016-12-09: qty 1

## 2016-12-09 MED ORDER — EPHEDRINE SULFATE-NACL 50-0.9 MG/10ML-% IV SOSY
PREFILLED_SYRINGE | INTRAVENOUS | Status: DC | PRN
  Administered 2016-12-09: 10 mg via INTRAVENOUS

## 2016-12-09 MED ORDER — ASPIRIN 325 MG PO TABS
325.0000 mg | ORAL_TABLET | Freq: Every day | ORAL | Status: DC
  Administered 2016-12-10: 325 mg via ORAL
  Filled 2016-12-09: qty 1

## 2016-12-09 MED ORDER — MORPHINE SULFATE (PF) 4 MG/ML IV SOLN
1.0000 mg | INTRAVENOUS | Status: DC | PRN
  Administered 2016-12-09: 1 mg via INTRAVENOUS
  Administered 2016-12-09: 2 mg via INTRAVENOUS
  Filled 2016-12-09 (×3): qty 1

## 2016-12-09 MED ORDER — PROMETHAZINE HCL 25 MG/ML IJ SOLN: 6.2500 mg | INTRAMUSCULAR | Status: DC | PRN

## 2016-12-09 MED ORDER — SUGAMMADEX SODIUM 200 MG/2ML IV SOLN
INTRAVENOUS | Status: DC | PRN
  Administered 2016-12-09: 200 mg via INTRAVENOUS

## 2016-12-09 MED ORDER — ROCURONIUM BROMIDE 100 MG/10ML IV SOLN
INTRAVENOUS | Status: DC | PRN
  Administered 2016-12-09: 10 mg via INTRAVENOUS
  Administered 2016-12-09: 50 mg via INTRAVENOUS

## 2016-12-09 MED ORDER — LIDOCAINE 2% (20 MG/ML) 5 ML SYRINGE
INTRAMUSCULAR | Status: AC
  Filled 2016-12-09: qty 5

## 2016-12-09 MED ORDER — CHLORHEXIDINE GLUCONATE CLOTH 2 % EX PADS: 6.0000 | MEDICATED_PAD | Freq: Once | CUTANEOUS | Status: DC

## 2016-12-09 MED ORDER — HYDROXYZINE HCL 25 MG PO TABS: 25.0000 mg | ORAL_TABLET | Freq: Every day | ORAL | Status: DC | PRN

## 2016-12-09 MED ORDER — HYDROCODONE-ACETAMINOPHEN 5-325 MG PO TABS
1.0000 | ORAL_TABLET | ORAL | Status: DC | PRN
  Administered 2016-12-09 – 2016-12-10 (×2): 1 via ORAL
  Filled 2016-12-09 (×2): qty 1

## 2016-12-09 MED ORDER — HYDROMORPHONE HCL 1 MG/ML IJ SOLN
INTRAMUSCULAR | Status: AC
  Filled 2016-12-09: qty 1

## 2016-12-09 MED ORDER — CEFAZOLIN SODIUM-DEXTROSE 2-4 GM/100ML-% IV SOLN
INTRAVENOUS | Status: AC
  Filled 2016-12-09: qty 100

## 2016-12-09 MED ORDER — MIDAZOLAM HCL 2 MG/2ML IJ SOLN
INTRAMUSCULAR | Status: AC
  Filled 2016-12-09: qty 2

## 2016-12-09 MED ORDER — HEPARIN SODIUM (PORCINE) 1000 UNIT/ML IJ SOLN
INTRAMUSCULAR | Status: DC | PRN
  Administered 2016-12-09: 1000 [IU] via INTRAVENOUS
  Administered 2016-12-09: 5000 [IU] via INTRAVENOUS

## 2016-12-09 MED ORDER — SUGAMMADEX SODIUM 200 MG/2ML IV SOLN
INTRAVENOUS | Status: AC
  Filled 2016-12-09: qty 2

## 2016-12-09 MED ORDER — LACTATED RINGERS IV SOLN
INTRAVENOUS | Status: DC
  Administered 2016-12-09: 09:00:00 via INTRAVENOUS

## 2016-12-09 MED ORDER — PHENYLEPHRINE HCL 10 MG/ML IJ SOLN
INTRAVENOUS | Status: DC | PRN
  Administered 2016-12-09: 30 ug/min via INTRAVENOUS

## 2016-12-09 MED ORDER — HYDROMORPHONE HCL 1 MG/ML IJ SOLN
0.2500 mg | INTRAMUSCULAR | Status: DC | PRN
  Administered 2016-12-09: 0.25 mg via INTRAVENOUS

## 2016-12-09 MED ORDER — PANTOPRAZOLE SODIUM 40 MG PO TBEC: 80.0000 mg | DELAYED_RELEASE_TABLET | Freq: Every day | ORAL | Status: DC

## 2016-12-09 MED ORDER — CEFAZOLIN SODIUM-DEXTROSE 2-4 GM/100ML-% IV SOLN
2.0000 g | INTRAVENOUS | Status: AC
  Administered 2016-12-09: 2 g via INTRAVENOUS

## 2016-12-09 MED ORDER — IOPAMIDOL (ISOVUE-300) INJECTION 61%
INTRAVENOUS | Status: AC
  Administered 2016-12-09: 100 mL
  Filled 2016-12-09: qty 150

## 2016-12-09 MED ORDER — ATORVASTATIN CALCIUM 20 MG PO TABS: 20.0000 mg | ORAL_TABLET | Freq: Every day | ORAL | Status: DC

## 2016-12-09 MED ORDER — PROPOFOL 10 MG/ML IV BOLUS
INTRAVENOUS | Status: DC | PRN
  Administered 2016-12-09: 200 mg via INTRAVENOUS

## 2016-12-09 MED ORDER — SODIUM CHLORIDE 0.9 % IV SOLN
INTRAVENOUS | Status: DC
  Administered 2016-12-09: 17:00:00 via INTRAVENOUS

## 2016-12-09 MED ORDER — ESMOLOL HCL 100 MG/10ML IV SOLN
INTRAVENOUS | Status: DC | PRN
  Administered 2016-12-09: 40 mg via INTRAVENOUS
  Administered 2016-12-09: 10 mg via INTRAVENOUS
  Administered 2016-12-09: 30 mg via INTRAVENOUS
  Administered 2016-12-09: 20 mg via INTRAVENOUS

## 2016-12-09 MED ORDER — DEXAMETHASONE SODIUM PHOSPHATE 10 MG/ML IJ SOLN
INTRAMUSCULAR | Status: DC | PRN
  Administered 2016-12-09: 10 mg via INTRAVENOUS

## 2016-12-09 MED ORDER — FENTANYL CITRATE (PF) 250 MCG/5ML IJ SOLN
INTRAMUSCULAR | Status: AC
  Filled 2016-12-09: qty 5

## 2016-12-09 MED ORDER — CLOPIDOGREL BISULFATE 75 MG PO TABS
75.0000 mg | ORAL_TABLET | Freq: Every day | ORAL | Status: DC
  Administered 2016-12-10: 75 mg via ORAL
  Filled 2016-12-09: qty 1

## 2016-12-09 MED ORDER — LABETALOL HCL 5 MG/ML IV SOLN: 10.0000 mg | INTRAVENOUS | Status: DC | PRN

## 2016-12-09 MED ORDER — FENTANYL CITRATE (PF) 100 MCG/2ML IJ SOLN
INTRAMUSCULAR | Status: DC | PRN
  Administered 2016-12-09: 250 ug via INTRAVENOUS

## 2016-12-09 MED ORDER — ONDANSETRON HCL 4 MG/2ML IJ SOLN
INTRAMUSCULAR | Status: AC
  Filled 2016-12-09: qty 2

## 2016-12-09 MED ORDER — HEPARIN SODIUM (PORCINE) 5000 UNIT/ML IJ SOLN
5000.0000 [IU] | Freq: Three times a day (TID) | INTRAMUSCULAR | Status: DC
  Administered 2016-12-09 – 2016-12-10 (×2): 5000 [IU] via SUBCUTANEOUS
  Filled 2016-12-09 (×2): qty 1

## 2016-12-09 MED ORDER — ONDANSETRON HCL 4 MG PO TABS: 4.0000 mg | ORAL_TABLET | ORAL | Status: DC | PRN

## 2016-12-09 MED ORDER — ONDANSETRON HCL 4 MG/2ML IJ SOLN
INTRAMUSCULAR | Status: DC | PRN
  Administered 2016-12-09: 4 mg via INTRAVENOUS

## 2016-12-09 MED ORDER — LIDOCAINE HCL (CARDIAC) 20 MG/ML IV SOLN
INTRAVENOUS | Status: DC | PRN
  Administered 2016-12-09: 40 mg via INTRAVENOUS
  Administered 2016-12-09: 60 mg via INTRAVENOUS

## 2016-12-09 MED ORDER — TIZANIDINE HCL 4 MG PO TABS
2.0000 mg | ORAL_TABLET | Freq: Three times a day (TID) | ORAL | Status: DC | PRN
  Administered 2016-12-09: 2 mg via ORAL
  Filled 2016-12-09: qty 1

## 2016-12-09 MED ORDER — PHENYLEPHRINE HCL 10 MG/ML IJ SOLN
INTRAMUSCULAR | Status: DC | PRN
  Administered 2016-12-09 (×4): 80 ug via INTRAVENOUS

## 2016-12-09 MED ORDER — ROCURONIUM BROMIDE 10 MG/ML (PF) SYRINGE
PREFILLED_SYRINGE | INTRAVENOUS | Status: AC
  Filled 2016-12-09: qty 5

## 2016-12-09 MED ORDER — PROPOFOL 10 MG/ML IV BOLUS
INTRAVENOUS | Status: AC
  Filled 2016-12-09: qty 20

## 2016-12-09 MED ORDER — ONDANSETRON HCL 4 MG/2ML IJ SOLN: 4.0000 mg | INTRAMUSCULAR | Status: DC | PRN

## 2016-12-09 MED ORDER — METFORMIN HCL ER 500 MG PO TB24
500.0000 mg | ORAL_TABLET | Freq: Every day | ORAL | Status: DC
  Administered 2016-12-10: 500 mg via ORAL
  Filled 2016-12-09: qty 1

## 2016-12-09 MED ORDER — INSULIN ASPART 100 UNIT/ML ~~LOC~~ SOLN
0.0000 [IU] | Freq: Three times a day (TID) | SUBCUTANEOUS | Status: DC
  Administered 2016-12-09: 2 [IU] via SUBCUTANEOUS

## 2016-12-09 NOTE — Anesthesia Procedure Notes (Signed)
Procedure Name: Intubation Date/Time: 12/09/2016 11:07 AM Performed by: Candis Shine, CRNA Pre-anesthesia Checklist: Patient identified, Emergency Drugs available, Suction available and Patient being monitored Patient Re-evaluated:Patient Re-evaluated prior to induction Oxygen Delivery Method: Circle System Utilized Preoxygenation: Pre-oxygenation with 100% oxygen Induction Type: IV induction Ventilation: Mask ventilation without difficulty Grade View: Grade I Tube type: Oral Tube size: 7.0 mm Number of attempts: 1 Airway Equipment and Method: Stylet and Oral airway Placement Confirmation: ETT inserted through vocal cords under direct vision,  positive ETCO2 and breath sounds checked- equal and bilateral Secured at: 23 cm Tube secured with: Tape Dental Injury: Teeth and Oropharynx as per pre-operative assessment  Comments: Performed by SRNA H. Botts

## 2016-12-09 NOTE — Anesthesia Preprocedure Evaluation (Signed)
Anesthesia Evaluation  Patient identified by MRN, date of birth, ID band Patient awake    Reviewed: Allergy & Precautions, NPO status , Patient's Chart, lab work & pertinent test results  Airway Mallampati: II  TM Distance: >3 FB Neck ROM: Full    Dental no notable dental hx.    Pulmonary Current Smoker,    Pulmonary exam normal breath sounds clear to auscultation       Cardiovascular hypertension, Normal cardiovascular exam Rhythm:Regular Rate:Normal     Neuro/Psych negative neurological ROS  negative psych ROS   GI/Hepatic Neg liver ROS, GERD  Medicated,  Endo/Other  negative endocrine ROS  Renal/GU negative Renal ROS  negative genitourinary   Musculoskeletal negative musculoskeletal ROS (+)   Abdominal   Peds negative pediatric ROS (+)  Hematology negative hematology ROS (+)   Anesthesia Other Findings   Reproductive/Obstetrics negative OB ROS                             Anesthesia Physical Anesthesia Plan  ASA: III  Anesthesia Plan: General   Post-op Pain Management:    Induction: Intravenous  PONV Risk Score and Plan: 2 and Ondansetron, Dexamethasone and Treatment may vary due to age or medical condition  Airway Management Planned: Oral ETT  Additional Equipment: Arterial line  Intra-op Plan:   Post-operative Plan: Extubation in OR  Informed Consent: I have reviewed the patients History and Physical, chart, labs and discussed the procedure including the risks, benefits and alternatives for the proposed anesthesia with the patient or authorized representative who has indicated his/her understanding and acceptance.   Dental advisory given  Plan Discussed with: CRNA and Surgeon  Anesthesia Plan Comments:         Anesthesia Quick Evaluation

## 2016-12-09 NOTE — H&P (Signed)
Chief Complaint  Brain aneurysm  History of Present Illness  Crystal [place] is a 56 year old woman seen for the above. She comes in after recent CT scan demonstrated likelihood of a right-sided carotid aneurysm. She says she has a long history of headaches, but has had significant worsening over the last few months. She has had associated blurry vision, which she says is present even if she closes one eye or the other. In addition, she does complain of some numbness on the left side of her face. She has no changes in bowel or bladder function, and continues to walk relatively normally. She is a half pack-a-day smoker. There is no family history of intracranial aneurysms, although her mother has a history of AAA. She does not have a history of hypertension.  She has undergone diagnostic angiogram demonstrating a large left carotid aneurysm and presents today for Pipeline embolization. She has been on ASA and Plavix.   Past Medical History   Past Medical History:  Diagnosis Date  . Arthritis   . Asthma   . Depression   . GERD (gastroesophageal reflux disease)   . Headache   . Hypertension   . MRSA (methicillin resistant Staphylococcus aureus)   . Pre-diabetes     Past Surgical History   Past Surgical History:  Procedure Laterality Date  . ABDOMINAL HYSTERECTOMY    . IR ANGIO INTRA EXTRACRAN SEL INTERNAL CAROTID BILAT MOD SED  10/20/2016  . IR ANGIO VERTEBRAL SEL VERTEBRAL BILAT MOD SED  10/20/2016    Social History   Social History   Tobacco Use  . Smoking status: Current Every Day Smoker    Packs/day: 1.00    Types: Cigarettes  . Smokeless tobacco: Never Used  Substance Use Topics  . Alcohol use: No    Alcohol/week: 0.0 oz  . Drug use: No    Medications   Prior to Admission medications   Medication Sig Start Date End Date Taking? Authorizing Provider  aspirin 325 MG tablet Take 325 mg by mouth daily.   Yes [provider]  atorvastatin (LIPITOR) 20 MG tablet  Take 1 tablet (20 mg total) by mouth daily. 11/30/16  Yes Fredia Beets R, FNP  clopidogrel (PLAVIX) 75 MG tablet Take 75 mg by mouth daily.   Yes [provider]  metFORMIN (GLUCOPHAGE-XR) 500 MG 24 hr tablet Take 1 tablet (500 mg total) by mouth daily with breakfast. 11/09/16  Yes Hairston, Mandesia R, FNP  esomeprazole (NEXIUM) 40 MG capsule Take 1 capsule (40 mg total) by mouth daily. Patient taking differently: Take 40 mg by mouth daily as needed (reflux.).  11/02/15   Maren Reamer, MD  gabapentin (NEURONTIN) 100 MG capsule Take 2 capsules (200 mg total) by mouth 3 (three) times daily. Patient not taking: Reported on 12/02/2016 11/02/15   Lottie Mussel T, MD  hydrOXYzine (ATARAX/VISTARIL) 25 MG tablet Take 25 mg by mouth daily as needed for anxiety.    [provider]  ibuprofen (ADVIL,MOTRIN) 800 MG tablet 1 tablet with food X 7 days then prn headache Patient not taking: Reported on 12/02/2016 09/01/16   Argentina Donovan, PA-C  nicotine (NICODERM CQ - DOSED IN MG/24 HOURS) 21 mg/24hr patch Place 1 patch (21 mg total) onto the skin daily. Patient not taking: Reported on 12/02/2016 11/09/16   Alfonse Spruce, FNP  nitrofurantoin, macrocrystal-monohydrate, (MACROBID) 100 MG capsule Take 1 capsule (100 mg total) by mouth 2 (two) times daily. Patient not taking: Reported on 12/02/2016 11/09/16  Alfonse Spruce, FNP  tiZANidine (ZANAFLEX) 2 MG tablet Take 1 tablet (2 mg total) by mouth every 8 (eight) hours as needed for muscle spasms. 09/05/16   Tresa Garter, MD    Allergies   Allergies  Allergen Reactions  . Doxycycline Shortness Of Breath, Swelling and Rash    Face swelling  . Tape Itching and Rash    Review of Systems  ROS  Neurologic Exam  Awake, alert, oriented Memory and concentration grossly intact Speech fluent, appropriate CN grossly intact Motor exam: Upper Extremities Deltoid Bicep Tricep Grip  Right 5/5 5/5 5/5 5/5    Left 5/5 5/5 5/5 5/5   Lower Extremities IP Quad PF DF EHL  Right 5/5 5/5 5/5 5/5 5/5  Left 5/5 5/5 5/5 5/5 5/5   Sensation grossly intact to LT  Imaging  Diagnostic angiogram was again reviewed. This demonstrates an approximately 16 mm left paraophthalmic aneurysm projecting towards the right.  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 I do think that this would best be treated by pipeline embolization, possibly with adjuvant coiling.  Plan  will plan on proceeding with pipeline embolization with possible adjuvant coiling of the left internal carotid artery aneurysm   I discussed in detail in the office the treatment options for the aneurysm. The risks of the procedure were explained in detail including risk of stroke or hemorrhage during or immediately after the procedure which could lead to numbness/weakness, paralysis, speech difficulty, coma, or death. I also reviewed other risks of procedure including contrast reaction, nephropathy, or groin hematoma. In addition, I talked to them about the general risks of anesthesia including heart attack, stroke, or DVT/PE. The patient understood our discussion and is willing to proceed. All the patient's questions were answered.

## 2016-12-09 NOTE — Anesthesia Procedure Notes (Signed)
Arterial Line Insertion Start/End11/30/2018 11:20 AM Performed by: Candis Shine, CRNA  Patient location: Pre-op. Preanesthetic checklist: patient identified, IV checked, site marked, risks and benefits discussed, surgical consent, monitors and equipment checked, pre-op evaluation, timeout performed and anesthesia consent Lidocaine 1% used for infiltration Left, radial was placed Catheter size: 20 G Hand hygiene performed  and maximum sterile barriers used   Attempts: 1 Procedure performed without using ultrasound guided technique. Following insertion, dressing applied. Post procedure assessment: normal and unchanged  Patient tolerated the procedure well with no immediate complications.

## 2016-12-09 NOTE — Transfer of Care (Signed)
Immediate Anesthesia Transfer of Care Note  Patient: [place]  Procedure(s) Performed: Pipeline embolization of aneurysm and possible coiling (N/A )  Patient Location: PACU  Anesthesia Type:General  Level of Consciousness: drowsy and patient cooperative  Airway & Oxygen Therapy: Patient Spontanous Breathing and Patient connected to nasal cannula oxygen  Post-op Assessment: Report given to RN and Post -op Vital signs reviewed and stable  Post vital signs: Reviewed and stable  Last Vitals:  Vitals:   12/09/16 0834  BP: 128/77  Pulse: 75  Resp: 20  Temp: 36.7 C  SpO2: 95%    Last Pain:  Vitals:   12/09/16 0904  TempSrc:   PainSc: 3          Complications: No apparent anesthesia complications

## 2016-12-09 NOTE — Anesthesia Postprocedure Evaluation (Signed)
Anesthesia Post Note  Patient: Crystal Stafford  Procedure(s) Performed: Pipeline embolization of aneurysm and possible coiling (N/A )     Patient location during evaluation: PACU Anesthesia Type: General Level of consciousness: awake and alert Pain management: pain level controlled Vital Signs Assessment: post-procedure vital signs reviewed and stable Respiratory status: spontaneous breathing, nonlabored ventilation, respiratory function stable and patient connected to nasal cannula oxygen Cardiovascular status: blood pressure returned to baseline and stable Postop Assessment: no apparent nausea or vomiting Anesthetic complications: no    Last Vitals:  Vitals:   12/09/16 1510 12/09/16 1515  BP: 95/69   Pulse: 87 81  Resp: 13 (!) 7  Temp:    SpO2: 98% 97%    Last Pain:  Vitals:   12/09/16 1400  TempSrc:   PainSc: 0-No pain                 [name]

## 2016-12-10 ENCOUNTER — Other Ambulatory Visit: Payer: Self-pay

## 2016-12-10 MED ORDER — HYDROCODONE-ACETAMINOPHEN 5-325 MG PO TABS: 1.0000 | ORAL_TABLET | ORAL | 0 refills | Status: DC | PRN

## 2016-12-10 NOTE — Discharge Summary (Signed)
Physician Discharge Summary  Patient ID: Crystal Stafford MRN: [id number] DOB/AGE: 1960-09-06 56 y.o.  Admit date: 12/09/2016 Discharge date: 12/10/2016  Admission Diagnoses: Left unruptured paraophthalmic aneurysm  Discharge Diagnoses: The same Active Problems:   Cerebral aneurysm, nonruptured   Discharged Condition: good  Hospital Course: Dr. Kathyrn Sheriff performed a stenting of the patient's aneurysm on 12/09/2016.  The patient's postoperative course was unremarkable.  On postoperative day #1 Crystal Stafford felt well and requested discharge to home.  Crystal Stafford was given written and oral discharge instructions all Crystal Stafford questions were answered.  Consults: None Significant Diagnostic Studies: Cerebral arteriogram Treatments: Stenting of aneurysm Discharge Exam: Blood pressure 111/62, pulse (!) 59, temperature 97.7 F (36.5 C), temperature source Oral, resp. rate 13, height 5\' 4"  (1.626 m), weight 77.5 kg (170 lb 13.7 oz), SpO2 98 %. Patient is alert and oriented.  Crystal Stafford strength and speech is normal.  Crystal Stafford groin dressing has a small bloodstained.  There is no hematoma.  Disposition: Home  Discharge Instructions    Call MD for:  difficulty breathing, headache or visual disturbances   Complete by:  As directed    Call MD for:  extreme fatigue   Complete by:  As directed    Call MD for:  hives   Complete by:  As directed    Call MD for:  persistant dizziness or light-headedness   Complete by:  As directed    Call MD for:  persistant nausea and vomiting   Complete by:  As directed    Call MD for:  redness, tenderness, or signs of infection (pain, swelling, redness, odor or green/yellow discharge around incision site)   Complete by:  As directed    Call MD for:  severe uncontrolled pain   Complete by:  As directed    Call MD for:  temperature >100.4   Complete by:  As directed    Diet - low sodium heart healthy   Complete by:  As directed    Discharge instructions   Complete by:  As directed    Call [phone] for a followup appointment. Take a stool softener while you are using pain medications.   Driving Restrictions   Complete by:  As directed    Do not drive for 2 weeks.   Increase activity slowly   Complete by:  As directed    Lifting restrictions   Complete by:  As directed    Do not lift more than 5 pounds. No excessive bending or twisting.   May shower / Bathe   Complete by:  As directed    He may shower after the pain Crystal Stafford is removed 3 days after surgery. Leave the incision alone.   Remove dressing in 24 hours   Complete by:  As directed      Allergies as of 12/10/2016      Reactions   Doxycycline Shortness Of Breath, Swelling, Rash   Face swelling   Tape Itching, Rash      Medication List    STOP taking these medications   ibuprofen 800 MG tablet Commonly known as:  ADVIL,MOTRIN   nicotine 21 mg/24hr patch Commonly known as:  NICODERM CQ - dosed in mg/24 hours   nitrofurantoin (macrocrystal-monohydrate) 100 MG capsule Commonly known as:  MACROBID     TAKE these medications   aspirin 325 MG tablet Take 325 mg by mouth daily.   atorvastatin 20 MG tablet Commonly known as:  LIPITOR Take 1 tablet (20 mg total) by mouth daily.  clopidogrel 75 MG tablet Commonly known as:  PLAVIX Take 75 mg by mouth daily.   esomeprazole 40 MG capsule Commonly known as:  NEXIUM Take 1 capsule (40 mg total) by mouth daily. What changed:    when to take this  reasons to take this   gabapentin 100 MG capsule Commonly known as:  NEURONTIN Take 2 capsules (200 mg total) by mouth 3 (three) times daily.   HYDROcodone-acetaminophen 5-325 MG tablet Commonly known as:  NORCO/VICODIN Take 1 tablet by mouth every 4 (four) hours as needed for moderate pain.   hydrOXYzine 25 MG tablet Commonly known as:  ATARAX/VISTARIL Take 25 mg by mouth daily as needed for anxiety.   metFORMIN 500 MG 24 hr tablet Commonly known as:  GLUCOPHAGE-XR Take 1 tablet (500 mg total)  by mouth daily with breakfast.   tiZANidine 2 MG tablet Commonly known as:  ZANAFLEX Take 1 tablet (2 mg total) by mouth every 8 (eight) hours as needed for muscle spasms.        Signed: Ophelia Charter 12/10/2016, 8:51 AM

## 2016-12-10 NOTE — Plan of Care (Signed)
Pt adequate for D/C

## 2016-12-10 NOTE — Progress Notes (Signed)
Discharge instructions reviewed with pt and pt's daughter. All questions answered. Pt VSS. PIV removed and site CDI. Pt will call to schedule f/u office visit. Prescription already sent to pt Rx, awaiting to be picked up.

## 2016-12-11 ENCOUNTER — Encounter (HOSPITAL_COMMUNITY): Payer: Self-pay | Admitting: Neurosurgery

## 2016-12-12 ENCOUNTER — Encounter (HOSPITAL_COMMUNITY): Payer: Self-pay | Admitting: Neurosurgery

## 2016-12-12 LAB — GLUCOSE, CAPILLARY: GLUCOSE-CAPILLARY: 95 mg/dL (ref 65–99)

## 2016-12-13 ENCOUNTER — Encounter (HOSPITAL_COMMUNITY): Payer: Self-pay | Admitting: Neurosurgery

## 2016-12-13 ENCOUNTER — Other Ambulatory Visit: Payer: Self-pay | Admitting: *Deleted

## 2016-12-13 NOTE — Patient Outreach (Signed)
[place] [facility]) Care Management  12/13/2016  Crystal Stafford 1960/02/12 [id number]  Referral via health Plan-[employer]; patient discharged from [facility] 12/10/2016:  Per chart review; Admission 11/30-12/01/2016 Procedure:cerebral arteriogram, stent placement , pipeline embolization  Dx: Cerebral aneurysm, unruptured  Telephone call to patient who was advised of reason for call and of [facility] care management services. HIPPA verification received from patient.   Patient voices she has brain procedure on 11/30 & was discharged the next day. States she lives alone but has daughter that offers support. States she has all of medications & is taking as prescribed. States she has discharge instructions & voices understanding of  restrictions, medications, MD appointments.  States she is awaiting call from neurosurgeon's office (Dr. Kathyrn Sheriff) for follow up appointment. States she has appointment scheduled with primary care provider -[facility] (NP-[place]) for follow up. States she is restricted from driving for 2 weeks but daughter will be able to provide transportation.   Patient voices that she stopped smoking cold [place] -1 week ago. Voices she has pain in right groin & headache since procedure done. Voices that she uses cold pack to right groin & takes pain medication which relieves groin pain & headache. States she does not have fever or drainage from groin but states there is a lot of bruising in area.  Patient advised of signs of infection & importance of reporting to MD if symptoms occur. Also advised of importance of taking medications as prescribed. Patient voices understanding of the above.   Patient is appropriate for Transition of care and consents to [facility] care management services.  Plan: Follow up with patient next week. Continue assessments Send involvement letter to MD Send welcome packet to patient.  Sherrin Daisy, RN BSN [place] Management  Coordinator [facility] Care Management  [phone]

## 2016-12-14 ENCOUNTER — Other Ambulatory Visit: Payer: Self-pay | Admitting: Internal Medicine

## 2016-12-14 DIAGNOSIS — R7303 Prediabetes: Secondary | ICD-10-CM

## 2016-12-14 DIAGNOSIS — K219 Gastro-esophageal reflux disease without esophagitis: Secondary | ICD-10-CM

## 2016-12-14 DIAGNOSIS — R200 Anesthesia of skin: Secondary | ICD-10-CM

## 2016-12-14 DIAGNOSIS — R202 Paresthesia of skin: Secondary | ICD-10-CM

## 2016-12-15 ENCOUNTER — Other Ambulatory Visit: Payer: Self-pay | Admitting: Family Medicine

## 2016-12-15 DIAGNOSIS — K219 Gastro-esophageal reflux disease without esophagitis: Secondary | ICD-10-CM

## 2016-12-16 NOTE — Telephone Encounter (Signed)
Spoke with patient and she was also requesting her muscle spasms medication

## 2016-12-16 NOTE — Telephone Encounter (Signed)
CMA call regarding medication refill been sent to pharmacy  Patient Verify DOB   Patient was aware and understood

## 2016-12-20 ENCOUNTER — Ambulatory Visit: Payer: Self-pay | Admitting: *Deleted

## 2016-12-21 ENCOUNTER — Other Ambulatory Visit: Payer: Self-pay | Admitting: *Deleted

## 2016-12-21 DIAGNOSIS — I10 Essential (primary) hypertension: Secondary | ICD-10-CM | POA: Diagnosis not present

## 2016-12-21 DIAGNOSIS — I671 Cerebral aneurysm, nonruptured: Secondary | ICD-10-CM | POA: Diagnosis not present

## 2016-12-21 NOTE — Patient Outreach (Signed)
[place] [facility]) Care Management  12/21/2016  BELMIRA DALEY 02-Apr-1960 [id number]  Transition of care: Call#2; No answer; left HIPPA compliant voice mail requesting return call.   Plan:  Will follow up.  Sherrin Daisy, RN BSN [place] Management Coordinator [facility] Care Management  [phone]

## 2016-12-23 ENCOUNTER — Other Ambulatory Visit: Payer: Self-pay | Admitting: *Deleted

## 2016-12-23 ENCOUNTER — Encounter: Payer: Self-pay | Admitting: *Deleted

## 2016-12-23 NOTE — Patient Outreach (Signed)
[place] [facility]) Care Management  12/23/2016   Crystal Stafford 1960/01/21 [id number]   Transition of care call #3  Subjective:  Patient voices that she has had hospital follow up appointment on 12/12 with surgeon. Voices that she has decreased vision in left eye & reported that to MD. States headaches have decreased and pain is relieved with pain medication. States she still has pain in right groin that decreases with pain medication. Advised patient to report any signs of fever, drainage, severe pain to MD office. Patient voices understanding.    Patient reports that she is taking medication consistently as prescribed by her physicians.  Voices she lives alone but has support from daughter. No problems with transportation.  Objective:  Admission 11/30-12/01/2016 Procedure:cerebral arteriogram, stent placement , pipeline embolization  Dx: Cerebral aneurysm, unruptured   Current Medications:  Current Outpatient Medications  Medication Sig Dispense Refill  . aspirin 325 MG tablet Take 325 mg by mouth daily.    Marland Kitchen atorvastatin (LIPITOR) 20 MG tablet Take 1 tablet (20 mg total) by mouth daily. 30 tablet 2  . clopidogrel (PLAVIX) 75 MG tablet Take 75 mg by mouth daily.    Marland Kitchen esomeprazole (NEXIUM) 40 MG capsule Take 1 capsule (40 mg total) by mouth daily as needed. 30 capsule 3  . HYDROcodone-acetaminophen (NORCO/VICODIN) 5-325 MG tablet Take 1 tablet by mouth every 4 (four) hours as needed for moderate pain. 30 tablet 0  . hydrOXYzine (ATARAX/VISTARIL) 25 MG tablet Take 25 mg by mouth daily as needed for anxiety.    . metFORMIN (GLUCOPHAGE-XR) 500 MG 24 hr tablet Take 1 tablet (500 mg total) by mouth daily with breakfast. 30 tablet 2  . gabapentin (NEURONTIN) 100 MG capsule Take 2 capsules (200 mg total) by mouth 3 (three) times daily. (Patient not taking: Reported on 12/02/2016) 120 capsule 2  . gabapentin (NEURONTIN) 300 MG capsule TAKE ONE CAPSULE BY MOUTH THREE TIMES DAILY  (Patient not taking: Reported on 12/23/2016) 90 capsule 3  . tiZANidine (ZANAFLEX) 2 MG tablet Take 1 tablet (2 mg total) by mouth every 8 (eight) hours as needed for muscle spasms. (Patient not taking: Reported on 12/13/2016) 90 tablet 0   No current facility-administered medications for this visit.     Functional Status:  In your present state of health, do you have any difficulty performing the following activities: 12/13/2016 12/09/2016  Hearing? N N  Vision? Y Y  Comment - left eye  Difficulty concentrating or making decisions? Tempie Donning  Walking or climbing stairs? Y Y  Dressing or bathing? N N  Doing errands, shopping? Y N  Some recent data might be hidden    Fall/Depression Screening: Fall Risk  12/13/2016 09/01/2016 06/04/2015  Falls in the past year? No No No  Risk for fall due to : Medication side effect;Impaired balance/gait - -   PHQ 2/9 Scores 12/13/2016 11/09/2016 09/01/2016 11/02/2015 06/04/2015 10/23/2014 10/23/2014  PHQ - 2 Score 0 '5 4 3 6 ' 0 0  PHQ- 9 Score - '19 16 11 19 ' - -    Assessment:  Recent hospital stay with dx aneursym-unruptured.  Patient appropriate for [facility] transition of care program.  Patient agrees to services.   Plan:  [facility] CM Care Plan Problem One     Most Recent Value  Care Plan Problem One  At risk for hospital readmission as evidenced by recent admission with aneursym repair   Role Documenting the Problem One  Care Management Telephonic Coordinator  Care Plan for  Problem One  Active  [facility] Long Term Goal   Avoid readmission to hospital within 2 weeks of previous admission   [facility] Long Term Goal Start Date  12/13/16  Interventions for Problem One Long Term Goal  Explanations of importance of taking meds, attending MD appts. early reporting of sxs  [facility] CM Short Term Goal #1   Patient will report appointment dates for PCP & neurosurgery follow ups within 2 weeks   [facility] CM Short Term Goal #1 Start Date  12/13/16  [facility] CM Short Term Goal #1 Met Date  12/23/16   Interventions for Short Term Goal #1  explanations of importance calling to get appts or confirming appt dates for hospital follow ups , note dates on calendar,   [facility] CM Short Term Goal #2   Patient states 2 symptoms of infection within  2 weeks  [facility] CM Short Term Goal #2 Start Date  12/13/16  Interventions for Short Term Goal #2  Explanation of signs of infection & importance of early reporting to MD if symptoms occur [advising/re-enforcing sxs-infections to report toMD]     Care plan updated as noted. Will continue to follow care plan as noted, Follow up next. Patient agrees with set appointment.  Sherrin Daisy, RN BSN [place] Management Coordinator [facility] Care Management  [phone]

## 2016-12-29 DIAGNOSIS — F331 Major depressive disorder, recurrent, moderate: Secondary | ICD-10-CM | POA: Diagnosis not present

## 2017-01-02 ENCOUNTER — Encounter: Payer: Self-pay | Admitting: *Deleted

## 2017-01-05 ENCOUNTER — Other Ambulatory Visit: Payer: Self-pay | Admitting: Family Medicine

## 2017-01-09 ENCOUNTER — Other Ambulatory Visit: Payer: Self-pay | Admitting: *Deleted

## 2017-01-09 NOTE — Patient Outreach (Signed)
[place] [facility]) Care Management  01/09/2017  REDA CITRON Jun 23, 1960 [id number]  Admission 11/30-12/01/2016 Procedure:cerebral arteriogram, stent placement , pipeline embolization  Dx: Cerebral aneurysm, unruptured.  Case closure call to patient.  HIPPA verification received.   Patient voices she has not had emergency room visit or hospital admission since recent hospital admission. States she continues to take all of medications prescribed as instructed by her MD. States she has follow up appointment with primary care provider in January. States she has had hospital follow up with neurosurgeon.   States groin pain is completely gone but that she does have headaches that require pain medications.   States she is abe to attend MD appointments & has support from her daughter.  Care plan goals haven been met.  Patient agrees to case closure.  Plan: Update care plan. Send MD closure letter. Send to care management assistant for case closure.   Sherrin Daisy, RN BSN [place] Management Coordinator [facility] Care Management  [phone]

## 2017-01-12 ENCOUNTER — Encounter: Payer: Self-pay | Admitting: *Deleted

## 2017-01-17 ENCOUNTER — Telehealth (INDEPENDENT_AMBULATORY_CARE_PROVIDER_SITE_OTHER): Payer: Self-pay | Admitting: *Deleted

## 2017-01-17 NOTE — Telephone Encounter (Signed)
-----   Message from Alfonse Spruce, [place] sent at 01/16/2017  5:47 PM EST ----- Regarding: Medication Hello,  No BP medications listed on active medication list prior to office visit. BP date of office is well controlled. If BP is greater than 130/80 and remains. Then I would recommend evaluation in office and starting BP medication.  Sincerely,  Fredia Beets FNP-BC  ----- Message ----- From: Marvetta Gibbons, CMA Sent: 12/06/2016   9:01 AM To: Alfonse Spruce, FNP  Spoke with patient & she stated that she never got her Blood pressure medication prescribe & if you prescribe she wants to be sent to [place] off at [place]

## 2017-01-17 NOTE — Telephone Encounter (Signed)
Patient verified DOB Patient is aware of not needing a BP medication due to her BP being well controlled at her visits Patient is complaining of bruising and leg pain in were she had a stent placed.  Patient states the surgeon redirected her to her PCP regarding the concerns. Patient has an appointment on 02/09/17 but believes she needs to be evaluated sooner.

## 2017-01-18 NOTE — Telephone Encounter (Signed)
Patient has appt.scheduled 02/09/17. Recent history of stent placement would like to be seen sooner. If there are any cancellations or available appts.prior to this date please reschedule. If patient is experiencing any significant swelling, redness, severe pain, inability to bear weight, or temperature change in extremity prior to scheduled office visit I would recommend she go to the ED.

## 2017-01-18 NOTE — Telephone Encounter (Signed)
Pt name and DOB verified. Appt scheduled. 01/19/17.Pt aware of  message and sx's to go to ED for. Verbalized understanding.

## 2017-01-19 ENCOUNTER — Encounter: Payer: Self-pay | Admitting: Family Medicine

## 2017-01-19 ENCOUNTER — Ambulatory Visit: Payer: Medicare HMO | Attending: Family Medicine | Admitting: Family Medicine

## 2017-01-19 VITALS — BP 145/83 | HR 100 | Temp 98.3°F | Resp 18 | Ht 64.0 in | Wt 171.0 lb

## 2017-01-19 DIAGNOSIS — H538 Other visual disturbances: Secondary | ICD-10-CM | POA: Diagnosis not present

## 2017-01-19 DIAGNOSIS — F329 Major depressive disorder, single episode, unspecified: Secondary | ICD-10-CM | POA: Insufficient documentation

## 2017-01-19 DIAGNOSIS — Z7984 Long term (current) use of oral hypoglycemic drugs: Secondary | ICD-10-CM | POA: Diagnosis not present

## 2017-01-19 DIAGNOSIS — Z79899 Other long term (current) drug therapy: Secondary | ICD-10-CM | POA: Diagnosis not present

## 2017-01-19 DIAGNOSIS — R7303 Prediabetes: Secondary | ICD-10-CM | POA: Diagnosis not present

## 2017-01-19 DIAGNOSIS — H409 Unspecified glaucoma: Secondary | ICD-10-CM | POA: Diagnosis not present

## 2017-01-19 DIAGNOSIS — Z8679 Personal history of other diseases of the circulatory system: Secondary | ICD-10-CM

## 2017-01-19 DIAGNOSIS — I10 Essential (primary) hypertension: Secondary | ICD-10-CM | POA: Insufficient documentation

## 2017-01-19 DIAGNOSIS — M79604 Pain in right leg: Secondary | ICD-10-CM

## 2017-01-19 DIAGNOSIS — Z7982 Long term (current) use of aspirin: Secondary | ICD-10-CM | POA: Insufficient documentation

## 2017-01-19 DIAGNOSIS — Z8659 Personal history of other mental and behavioral disorders: Secondary | ICD-10-CM | POA: Diagnosis not present

## 2017-01-19 DIAGNOSIS — E785 Hyperlipidemia, unspecified: Secondary | ICD-10-CM | POA: Diagnosis not present

## 2017-01-19 DIAGNOSIS — Z8669 Personal history of other diseases of the nervous system and sense organs: Secondary | ICD-10-CM | POA: Diagnosis not present

## 2017-01-19 DIAGNOSIS — I671 Cerebral aneurysm, nonruptured: Secondary | ICD-10-CM | POA: Insufficient documentation

## 2017-01-19 DIAGNOSIS — R51 Headache: Secondary | ICD-10-CM | POA: Insufficient documentation

## 2017-01-19 DIAGNOSIS — R519 Headache, unspecified: Secondary | ICD-10-CM

## 2017-01-19 DIAGNOSIS — G8929 Other chronic pain: Secondary | ICD-10-CM

## 2017-01-19 LAB — GLUCOSE, POCT (MANUAL RESULT ENTRY): POC Glucose: 147 mg/dl — AB (ref 70–99)

## 2017-01-19 MED ORDER — ACETAMINOPHEN 500 MG PO TABS: 1000.0000 mg | ORAL_TABLET | Freq: Three times a day (TID) | ORAL | 0 refills | Status: DC | PRN

## 2017-01-19 MED ORDER — TRAMADOL HCL 50 MG PO TABS: 50.0000 mg | ORAL_TABLET | Freq: Three times a day (TID) | ORAL | 0 refills | Status: DC | PRN

## 2017-01-19 NOTE — Progress Notes (Signed)
Patient stated she is having bruise on her body and she is on blood thinner. Patient stated she request medication for pain. Patient stated she did not take Nexium due to pharmacy told her it do not go along with her blood thinner medication. Patient stated her bruises do not come in pain. Patient stated she is getting pain due to from her surgery and down from her legs.

## 2017-01-19 NOTE — Progress Notes (Signed)
Subjective:  Patient ID: Crystal Stafford, female    DOB: 21-Jul-1960  Age: 57 y.o. MRN: [id number]  CC: Pain   HPI Crystal Stafford presents for c/o right leg pain. Recent history diagnosed brain aneurysm left ICA 09/08/2016.  Pipeline emobolization procedure on 12/09/2016. She reports headaches. Frequency QOD. Episodes last a hour or less. Headaches described as tension, throbbing, and is primarily left sided. She denies any nausea, vomiting, or weakness. She does report left eye blurred vision with floaters she describes as " white speckles". She reports symptoms of floaters, which  were present prior to surgical procedures. She does report history of visual impairment as wears glasses. Her last ophthalmologist appointment was over one year ago. She reports being diagnosed with glaucoma in both eyes in the past.  She denies any family history of brain aneurysm.  She does report her mother had history of aortic aneurysm. Hypertension/HLD: She is not exercising and is not adherent to low salt diet.  She does not check BP at home. Cardiac symptoms none. Patient denies chest pain, dyspnea, palpitations and syncope.  Cardiovascular risk factors: dyslipidemia, hypertension, sedentary lifestyle and smoking/ tobacco exposure.   Use of agents associated with hypertension: none. History of target organ damage: none.  Right leg pain: Onset this earlier this week. Pain is described as radiating from groin down right lower leg.  Pain 9/10 at worst. She denies any increased swelling or temperature changes to the extremity. Depression: She reports history of depression.  She receives  outpatient therapy at family services at the [place].  She declines any medication at this time.  She denies any SI/HI.  She declines speaking with the LCSW today.    Outpatient Medications Prior to Visit  Medication Sig Dispense Refill  . aspirin 325 MG tablet Take 325 mg by mouth daily.    Marland Kitchen atorvastatin (LIPITOR) 20 MG tablet Take 1  tablet (20 mg total) by mouth daily. 30 tablet 2  . clopidogrel (PLAVIX) 75 MG tablet Take 75 mg by mouth daily.    . hydrOXYzine (ATARAX/VISTARIL) 25 MG tablet Take 25 mg by mouth daily as needed for anxiety.    . metFORMIN (GLUCOPHAGE-XR) 500 MG 24 hr tablet Take 1 tablet (500 mg total) by mouth daily with breakfast. 30 tablet 2  . esomeprazole (NEXIUM) 40 MG capsule Take 1 capsule (40 mg total) by mouth daily as needed. (Patient not taking: Reported on 01/19/2017) 30 capsule 3  . gabapentin (NEURONTIN) 100 MG capsule Take 2 capsules (200 mg total) by mouth 3 (three) times daily. (Patient not taking: Reported on 12/02/2016) 120 capsule 2  . gabapentin (NEURONTIN) 300 MG capsule TAKE ONE CAPSULE BY MOUTH THREE TIMES DAILY (Patient not taking: Reported on 12/23/2016) 90 capsule 3  . HYDROcodone-acetaminophen (NORCO/VICODIN) 5-325 MG tablet Take 1 tablet by mouth every 4 (four) hours as needed for moderate pain. (Patient not taking: Reported on 01/19/2017) 30 tablet 0  . tiZANidine (ZANAFLEX) 2 MG tablet Take 1 tablet (2 mg total) by mouth every 8 (eight) hours as needed for muscle spasms. (Patient not taking: Reported on 12/13/2016) 90 tablet 0   No facility-administered medications prior to visit.     ROS Review of Systems  Constitutional: Positive for fatigue.  Eyes: Positive for visual disturbance (history of glaucoma).  Respiratory: Negative.   Cardiovascular: Negative.   Musculoskeletal: Positive for myalgias (right groin/leg).  Neurological: Positive for headaches.  Psychiatric/Behavioral: Negative for suicidal ideas.       History of  depression.    Objective:  BP (!) 145/83 (BP Location: Right Arm, Patient Position: Sitting, Cuff Size: Normal)   Pulse 100   Temp 98.3 F (36.8 C) (Oral)   Resp 18   Ht 5\' 4"  (1.626 m)   Wt 171 lb (77.6 kg)   SpO2 99%   BMI 29.35 kg/m   BP/Weight 01/19/2017 12/10/2016 93/23/5573  Systolic BP 220 254 -  Diastolic BP 83 87 -  Wt. (Lbs) 171 -  170.86  BMI 29.35 - 29.33     Physical Exam  Constitutional: She appears well-developed and well-nourished.  HENT:  Head: Normocephalic and atraumatic.  Right Ear: External ear normal.  Left Ear: External ear normal.  Nose: Nose normal.  Mouth/Throat: Oropharynx is clear and moist.  Eyes: Conjunctivae and EOM are normal. Pupils are equal, round, and reactive to light.  Neck: No JVD present.  Cardiovascular: Normal rate, regular rhythm, normal heart sounds and intact distal pulses.  Pulses:      Carotid pulses are 2+ on the right side, and 2+ on the left side.      Radial pulses are 2+ on the right side, and 2+ on the left side.       Dorsalis pedis pulses are 2+ on the right side, and 2+ on the left side.       Posterior tibial pulses are 2+ on the right side, and 2+ on the left side.  Pulmonary/Chest: Effort normal and breath sounds normal.  Abdominal: Soft. Bowel sounds are normal.  Musculoskeletal: Normal range of motion. She exhibits no edema.  Neurological: She is alert.  Skin: Skin is warm and dry.  Psychiatric: She exhibits a depressed mood. She expresses no homicidal and no suicidal ideation. She expresses no suicidal plans and no homicidal plans.  Nursing note and vitals reviewed.    Assessment & Plan:   1. Chronic left-sided headaches Follow up with neurology specialist. ED precautions given.  - traMADol (ULTRAM) 50 MG tablet; Take 1 tablet (50 mg total) by mouth every 8 (eight) hours as needed.  Dispense: 30 tablet; Refill: 0 - acetaminophen (TYLENOL) 500 MG tablet; Take 2 tablets (1,000 mg total) by mouth every 8 (eight) hours as needed for headache.  Dispense: 30 tablet; Refill: 0  2. Right leg pain Upon waiting for chaperone for evaluation, patient left office before right groin area could be assessed, before AVS could be given, and prescription script. - traMADol (ULTRAM) 50 MG tablet; Take 1 tablet (50 mg total) by mouth every 8 (eight) hours as needed.   Dispense: 30 tablet; Refill: 0 - acetaminophen (TYLENOL) 500 MG tablet; Take 2 tablets (1,000 mg total) by mouth every 8 (eight) hours as needed for headache.  Dispense: 30 tablet; Refill: 0  3. Blurred vision, left eye   - Ambulatory referral to Ophthalmology  4. History of glaucoma   - Ambulatory referral to Ophthalmology  5. History of  hypertension History of hypertension, BP controlled without use of anti-hypertensives. BP elevated in the presence of acute pain. Recommend BP check within 1 week to evaluate.  Patient left before AVS and scripts could be given.  6. Prediabetes  - Glucose (CBG)  7. History of depression -She declines speaking with LCSW or resources.   8. History of visual impairment  - Ambulatory referral to Ophthalmology  Follow up: Return in about 1 week (around 01/26/2017) for BP check .    Alfonse Spruce FNP

## 2017-02-01 ENCOUNTER — Other Ambulatory Visit: Payer: Self-pay | Admitting: Family Medicine

## 2017-02-01 DIAGNOSIS — R7303 Prediabetes: Secondary | ICD-10-CM

## 2017-02-06 IMAGING — NM NM MISC PROCEDURE
6 series · 36 of 36 positions shown · non-contrast
Comparison: none

[Series 1: rest · 6.51mm/px · 6 of 64 frames shown]
[frame 6/64]
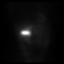
[frame 16/64]
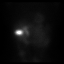
[frame 27/64]
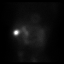
[frame 38/64]
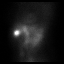
[frame 48/64]
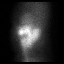
[frame 59/64]
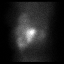

[Series 1: wbr_r-proj_st rest · 6.51mm/px · 6 of 64 frames shown]
[frame 6/64]
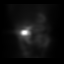
[frame 16/64]
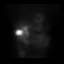
[frame 27/64]
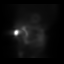
[frame 38/64]
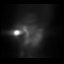
[frame 48/64]
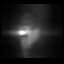
[frame 59/64]
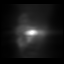

[Series 2: wbr_s-proj_st stress · 6.51mm/px · 6 of 64 frames shown (1 of 2)]
[frame 6/64]
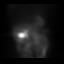
[frame 16/64]
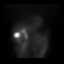
[frame 27/64]
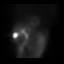
[frame 38/64]
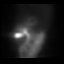
[frame 48/64]
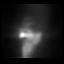
[frame 59/64]
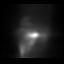

[Series 2: stress · 6.51mm/px · 6 of 64 frames shown (1 of 2)]
[frame 6/64]
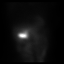
[frame 16/64]
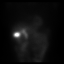
[frame 27/64]
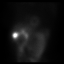
[frame 38/64]
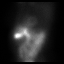
[frame 48/64]
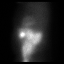
[frame 59/64]
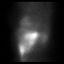

[Series 2: wbr_s-proj_st stress · 6.51mm/px · 6 of 512 frames shown (2 of 2)]
[frame 43/512]
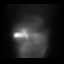
[frame 128/512]
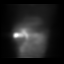
[frame 214/512]
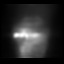
[frame 299/512]
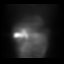
[frame 384/512]
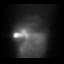
[frame 470/512]
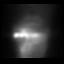

[Series 2: stress · 6.51mm/px · 6 of 512 frames shown (2 of 2)]
[frame 43/512]
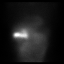
[frame 128/512]
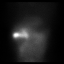
[frame 214/512]
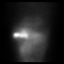
[frame 299/512]
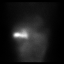
[frame 384/512]
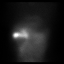
[frame 470/512]
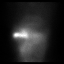

[36 of 36 positions shown; findings below may reference images not displayed]

Canned report from images found in remote index.

Refer to host system for actual result text.

## 2017-02-09 ENCOUNTER — Ambulatory Visit: Payer: Medicare HMO | Admitting: Family Medicine

## 2017-02-16 DIAGNOSIS — F331 Major depressive disorder, recurrent, moderate: Secondary | ICD-10-CM | POA: Diagnosis not present

## 2017-02-28 DIAGNOSIS — G2581 Restless legs syndrome: Secondary | ICD-10-CM | POA: Diagnosis not present

## 2017-02-28 DIAGNOSIS — F331 Major depressive disorder, recurrent, moderate: Secondary | ICD-10-CM | POA: Diagnosis not present

## 2017-02-28 DIAGNOSIS — K219 Gastro-esophageal reflux disease without esophagitis: Secondary | ICD-10-CM | POA: Diagnosis not present

## 2017-02-28 DIAGNOSIS — F17200 Nicotine dependence, unspecified, uncomplicated: Secondary | ICD-10-CM | POA: Diagnosis not present

## 2017-02-28 DIAGNOSIS — I671 Cerebral aneurysm, nonruptured: Secondary | ICD-10-CM | POA: Diagnosis not present

## 2017-02-28 DIAGNOSIS — E785 Hyperlipidemia, unspecified: Secondary | ICD-10-CM | POA: Diagnosis not present

## 2017-02-28 DIAGNOSIS — R7303 Prediabetes: Secondary | ICD-10-CM | POA: Diagnosis not present

## 2017-03-06 ENCOUNTER — Other Ambulatory Visit: Payer: Self-pay

## 2017-03-06 ENCOUNTER — Encounter (HOSPITAL_COMMUNITY): Payer: Self-pay

## 2017-03-06 ENCOUNTER — Emergency Department (HOSPITAL_COMMUNITY): Payer: Medicare HMO

## 2017-03-06 ENCOUNTER — Emergency Department (HOSPITAL_COMMUNITY)
Admission: EM | Admit: 2017-03-06 | Discharge: 2017-03-06 | Disposition: A | Discharge status: Home / Self Care | Payer: Medicare HMO | Attending: Emergency Medicine | Admitting: Emergency Medicine

## 2017-03-06 DIAGNOSIS — R0602 Shortness of breath: Secondary | ICD-10-CM | POA: Diagnosis not present

## 2017-03-06 DIAGNOSIS — J45909 Unspecified asthma, uncomplicated: Secondary | ICD-10-CM | POA: Insufficient documentation

## 2017-03-06 DIAGNOSIS — Z7982 Long term (current) use of aspirin: Secondary | ICD-10-CM | POA: Insufficient documentation

## 2017-03-06 DIAGNOSIS — Z79899 Other long term (current) drug therapy: Secondary | ICD-10-CM | POA: Diagnosis not present

## 2017-03-06 DIAGNOSIS — R6889 Other general symptoms and signs: Secondary | ICD-10-CM

## 2017-03-06 DIAGNOSIS — J111 Influenza due to unidentified influenza virus with other respiratory manifestations: Secondary | ICD-10-CM | POA: Diagnosis not present

## 2017-03-06 DIAGNOSIS — I10 Essential (primary) hypertension: Secondary | ICD-10-CM | POA: Insufficient documentation

## 2017-03-06 DIAGNOSIS — R079 Chest pain, unspecified: Secondary | ICD-10-CM | POA: Diagnosis not present

## 2017-03-06 DIAGNOSIS — F17210 Nicotine dependence, cigarettes, uncomplicated: Secondary | ICD-10-CM | POA: Diagnosis not present

## 2017-03-06 DIAGNOSIS — R05 Cough: Secondary | ICD-10-CM | POA: Diagnosis not present

## 2017-03-06 LAB — BASIC METABOLIC PANEL
ANION GAP: 11 (ref 5–15)
BUN: 11 mg/dL (ref 6–20)
CALCIUM: 9.1 mg/dL (ref 8.9–10.3)
CO2: 23 mmol/L (ref 22–32)
Chloride: 105 mmol/L (ref 101–111)
Creatinine, Ser: 1.22 mg/dL — ABNORMAL HIGH (ref 0.44–1.00)
GFR calc non Af Amer: 49 mL/min — ABNORMAL LOW (ref >60)
GFR, EST AFRICAN AMERICAN: 57 mL/min — AB (ref >60)
GLUCOSE: 86 mg/dL (ref 65–99)
POTASSIUM: 4.7 mmol/L (ref 3.5–5.1)
SODIUM: 139 mmol/L (ref 135–145)

## 2017-03-06 LAB — CBC
HEMATOCRIT: 37.9 % (ref 36.0–46.0)
Hemoglobin: 12.2 g/dL (ref 12.0–15.0)
MCH: 28.2 pg (ref 26.0–34.0)
MCHC: 32.2 g/dL (ref 30.0–36.0)
MCV: 87.5 fL (ref 78.0–100.0)
Platelets: 264 10*3/uL (ref 150–400)
RBC: 4.33 MIL/uL (ref 3.87–5.11)
RDW: 15.4 % (ref 11.5–15.5)
WBC: 7 10*3/uL (ref 4.0–10.5)

## 2017-03-06 LAB — I-STAT BETA HCG BLOOD, ED (MC, WL, AP ONLY)

## 2017-03-06 LAB — I-STAT TROPONIN, ED: TROPONIN I, POC: 0 ng/mL (ref 0.00–0.08)

## 2017-03-06 MED ORDER — ALBUTEROL SULFATE HFA 108 (90 BASE) MCG/ACT IN AERS
2.0000 | INHALATION_SPRAY | RESPIRATORY_TRACT | Status: DC
  Administered 2017-03-06: 2 via RESPIRATORY_TRACT
  Filled 2017-03-06: qty 6.7

## 2017-03-06 MED ORDER — OSELTAMIVIR PHOSPHATE 75 MG PO CAPS: 75.0000 mg | ORAL_CAPSULE | Freq: Two times a day (BID) | ORAL | 0 refills | Status: DC

## 2017-03-06 MED ORDER — IBUPROFEN 400 MG PO TABS
600.0000 mg | ORAL_TABLET | Freq: Once | ORAL | Status: AC
  Administered 2017-03-06: 11:00:00 600 mg via ORAL
  Filled 2017-03-06: qty 1

## 2017-03-06 MED ORDER — ACETAMINOPHEN 500 MG PO TABS
1000.0000 mg | ORAL_TABLET | Freq: Once | ORAL | Status: AC
  Administered 2017-03-06: 1000 mg via ORAL
  Filled 2017-03-06: qty 2

## 2017-03-06 NOTE — ED Triage Notes (Signed)
Pt presents to the ed with complaints of chest pain and flu like symptoms x 5 days. Pt states pain is worse with cough. Hx of surgery recently for aneurysm in her brain. NAD  In triage

## 2017-03-06 NOTE — ED Provider Notes (Signed)
[facility] EMERGENCY DEPARTMENT Provider Note   CSN: [id number] Arrival date & time: 03/06/17  0730     History   Chief Complaint Chief Complaint  Patient presents with  . Chest Pain  . Influenza    HPI Crystal Stafford is a 57 y.o. female.  HPI Patient is a 57 year old female presents to the emergency department with complaints of cough and congestion and myalgias over the past 4-5 days.  She reports her symptoms are worsening.  Reports some pain in her chest but only when coughing.  Denies back pain.  No unilateral leg swelling.  No history of DVT or pulmonary embolism.  Reports chills without documented fever.  Symptoms are moderate in severity.   Past Medical History:  Diagnosis Date  . Anxiety   . Arthritis   . Asthma   . Depression   . GERD (gastroesophageal reflux disease)   . Headache   . Hypertension   . MRSA (methicillin resistant Staphylococcus aureus)   . Pre-diabetes     Patient Active Problem List   Diagnosis Date Noted  . Cerebral aneurysm, nonruptured 12/09/2016  . Aneurysm ([place]) 09/29/2016  . 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 Hospital discharge follow-up 10/23/2014  . PICC (peripherally inserted central catheter) removal 10/23/2014  . Sepsis ([place])   . Leukocytosis 10/10/2014  . SIRS (systemic inflammatory response syndrome) ([place]) 10/10/2014  . Impaired glucose tolerance 10/10/2014  . Myalgia   . Essential hypertension 10/21/2013  . Midline low back pain with left-sided sciatica 10/21/2013  . Breast cancer screening 10/21/2013  . Gastroesophageal reflux disease without esophagitis 10/21/2013  . Encounter for screening mammogram for malignant neoplasm of breast 10/21/2013  . Numbness and tingling 05/20/2013  . Shoulder pain, right 02/18/2013  . Morbid obesity ([place]) 02/18/2013  . Physical exam, routine 10/25/2012  . Allergy 10/25/2012  . Hypertension 04/06/2012  . Prediabetes 04/06/2012  . Peptic ulcer  04/06/2012  . INSOMNIA 02/04/2010  . SEBACEOUS CYST, SCALP 08/21/2009  . HYPERLIPIDEMIA 06/02/2009  . HEADACHE 06/02/2009  . DEPRESSION 05/19/2009  . FATIGUE 05/19/2009  . 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 TOBACCO ABUSE 11/24/2006  . PELVIC PAIN, RIGHT 11/24/2006  . ASTHMA 11/10/2005    Past Surgical History:  Procedure Laterality Date  . ABDOMINAL HYSTERECTOMY    . IR ANGIO INTRA EXTRACRAN SEL INTERNAL CAROTID BILAT MOD SED  10/20/2016  . IR ANGIO INTRA EXTRACRAN SEL INTERNAL CAROTID BILAT MOD SED  12/09/2016  . IR ANGIO VERTEBRAL SEL VERTEBRAL BILAT MOD SED  10/20/2016  . IR ANGIOGRAM FOLLOW UP STUDY  12/09/2016  . IR TRANSCATH/EMBOLIZ  12/09/2016  . RADIOLOGY WITH ANESTHESIA N/A 12/09/2016   Procedure: Pipeline embolization of aneurysm and possible coiling;  Surgeon: Consuella Lose, MD;  Location: [facility];  Service: Radiology;  Laterality: N/A;    OB History    No data available       Home Medications    Prior to Admission medications   Medication Sig Start Date End Date Taking? Authorizing Provider  acetaminophen (TYLENOL) 500 MG tablet Take 2 tablets (1,000 mg total) by mouth every 8 (eight) hours as needed for headache. 01/19/17  Yes Hairston, Toy Baker R, FNP  aspirin 325 MG tablet Take 325 mg by mouth daily.   Yes [provider]  atorvastatin (LIPITOR) 20 MG tablet Take 1 tablet (20 mg total) by mouth daily. 11/30/16  Yes Fredia Beets R, FNP  clopidogrel (PLAVIX) 75 MG tablet Take 75 mg by  mouth daily.   Yes [provider]  hydrOXYzine (ATARAX/VISTARIL) 25 MG tablet Take 25 mg by mouth daily as needed for anxiety.   Yes [provider]  metFORMIN (GLUCOPHAGE-XR) 500 MG 24 hr tablet TAKE 1 TABLET BY MOUTH ONCE DAILY WITH BREAKFAST 02/01/17  Yes Hairston, Mandesia R, FNP  pramipexole (MIRAPEX) 0.125 MG tablet Take 0.125 mg by mouth at bedtime.   Yes [provider]  tiZANidine (ZANAFLEX) 2 MG tablet Take 1  tablet (2 mg total) by mouth every 8 (eight) hours as needed for muscle spasms. 09/05/16  Yes Tresa Garter, MD  esomeprazole (NEXIUM) 40 MG capsule Take 1 capsule (40 mg total) by mouth daily as needed. Patient not taking: Reported on 01/19/2017 12/15/16   Alfonse Spruce, FNP  gabapentin (NEURONTIN) 100 MG capsule Take 2 capsules (200 mg total) by mouth 3 (three) times daily. Patient not taking: Reported on 12/02/2016 11/02/15   Lottie Mussel T, MD  gabapentin (NEURONTIN) 300 MG capsule TAKE ONE CAPSULE BY MOUTH THREE TIMES DAILY Patient not taking: Reported on 12/23/2016 12/15/16   Alfonse Spruce, FNP  HYDROcodone-acetaminophen (NORCO/VICODIN) 5-325 MG tablet Take 1 tablet by mouth every 4 (four) hours as needed for moderate pain. Patient not taking: Reported on 01/19/2017 12/10/16   Newman Pies, MD  oseltamivir (TAMIFLU) 75 MG capsule Take 1 capsule (75 mg total) by mouth every 12 (twelve) hours. 03/06/17   Jola Schmidt, MD  traMADol (ULTRAM) 50 MG tablet Take 1 tablet (50 mg total) by mouth every 8 (eight) hours as needed. Patient not taking: Reported on 03/06/2017 01/19/17   Alfonse Spruce, FNP    Family History Family History  Problem Relation Age of Onset  . Colon cancer Maternal Grandfather 48  . Breast cancer Maternal Aunt     Social History Social History   Tobacco Use  . Smoking status: Current Every Day Smoker    Packs/day: 1.00    Types: Cigarettes  . Smokeless tobacco: Never Used  Substance Use Topics  . Alcohol use: No    Alcohol/week: 0.0 oz  . Drug use: No     Allergies   Doxycycline and Tape   Review of Systems Review of Systems  All other systems reviewed and are negative.    Physical Exam Updated Vital Signs BP (!) 133/91   Pulse 92   Temp 98.3 F (36.8 C)   Resp 18   Wt 77.6 kg (171 lb)   SpO2 100%   BMI 29.35 kg/m   Physical Exam  Constitutional: She is oriented to person, place, and time. She appears  well-developed and well-nourished. No distress.  HENT:  Head: Normocephalic and atraumatic.  Eyes: EOM are normal.  Neck: Normal range of motion.  Cardiovascular: Normal rate, regular rhythm and normal heart sounds.  Pulmonary/Chest: Effort normal and breath sounds normal.  Abdominal: Soft. She exhibits no distension. There is no tenderness.  Musculoskeletal: Normal range of motion.  Neurological: She is alert and oriented to person, place, and time.  Skin: Skin is warm and dry.  Psychiatric: She has a normal mood and affect. Judgment normal.  Nursing note and vitals reviewed.    ED Treatments / Results  Labs (all labs ordered are listed, but only abnormal results are displayed) Labs Reviewed  BASIC METABOLIC PANEL  CBC  I-STAT TROPONIN, ED  I-STAT BETA HCG BLOOD, ED ([facility], [facility], [facility] ONLY)    EKG  EKG Interpretation None       Radiology Dg  Chest 2 View  Result Date: 03/06/2017 CLINICAL DATA:  Shortness of breath, chest congestion and tightness, generalized body aches, and productive cough for 2 days. History of asthma, hypertension, cerebral aneurysm, current smoker. EXAM: CHEST  2 VIEW COMPARISON:  PA and lateral chest x-ray of October 10, 2015 FINDINGS: The lungs are well-expanded and clear. The heart and pulmonary vascularity are normal. The mediastinum is normal in width. There is no pleural effusion. There is gentle reverse S shaped thoracolumbar scoliosis. IMPRESSION: There is no pneumonia nor other acute cardiopulmonary abnormality. Electronically Signed   By: David  Martinique M.D.   On: 03/06/2017 08:27    Procedures Procedures (including critical care time)  Medications Ordered in ED Medications  albuterol (PROVENTIL HFA;VENTOLIN HFA) 108 (90 Base) MCG/ACT inhaler 2 puff (not administered)  ibuprofen (ADVIL,MOTRIN) tablet 600 mg (not administered)  acetaminophen (TYLENOL) tablet 1,000 mg (not administered)     Initial Impression / Assessment and Plan / ED Course    I have reviewed the triage vital signs and the nursing notes.  Pertinent labs & imaging results that were available during my care of the patient were reviewed by me and considered in my medical decision making (see chart for details).     Influenza-like illness.  Home with symptomatic care.  Home with Tamiflu.  Patient understands return to the ER for new or worsening symptoms.  Patient with productive cough.  Albuterol to help with upper respiratory symptoms  Final Clinical Impressions(s) / ED Diagnoses   Final diagnoses:  Flu-like symptoms    ED Discharge Orders        Ordered    oseltamivir (TAMIFLU) 75 MG capsule  Every 12 hours     03/06/17 1101       Jola Schmidt, MD 03/06/17 1103

## 2017-03-06 NOTE — ED Notes (Signed)
Only able to get ISTAT on pt.

## 2017-03-17 LAB — POCT ABI - SCREENING FOR PILOT NO CHARGE: OTHER: NEGATIVE

## 2017-03-29 DIAGNOSIS — L84 Corns and callosities: Secondary | ICD-10-CM | POA: Diagnosis not present

## 2017-04-10 ENCOUNTER — Encounter (HOSPITAL_COMMUNITY): Payer: Self-pay

## 2017-04-10 ENCOUNTER — Other Ambulatory Visit: Payer: Self-pay

## 2017-04-10 ENCOUNTER — Emergency Department (HOSPITAL_COMMUNITY)
Admission: EM | Admit: 2017-04-10 | Discharge: 2017-04-10 | Disposition: A | Discharge status: Home / Self Care | Payer: Medicare HMO | Attending: Emergency Medicine | Admitting: Emergency Medicine

## 2017-04-10 ENCOUNTER — Emergency Department (HOSPITAL_COMMUNITY): Payer: Medicare HMO

## 2017-04-10 DIAGNOSIS — R0789 Other chest pain: Secondary | ICD-10-CM | POA: Insufficient documentation

## 2017-04-10 DIAGNOSIS — I10 Essential (primary) hypertension: Secondary | ICD-10-CM | POA: Diagnosis not present

## 2017-04-10 DIAGNOSIS — J45909 Unspecified asthma, uncomplicated: Secondary | ICD-10-CM | POA: Insufficient documentation

## 2017-04-10 DIAGNOSIS — Z7982 Long term (current) use of aspirin: Secondary | ICD-10-CM | POA: Diagnosis not present

## 2017-04-10 DIAGNOSIS — F17210 Nicotine dependence, cigarettes, uncomplicated: Secondary | ICD-10-CM | POA: Insufficient documentation

## 2017-04-10 DIAGNOSIS — Z79899 Other long term (current) drug therapy: Secondary | ICD-10-CM | POA: Insufficient documentation

## 2017-04-10 DIAGNOSIS — Z7902 Long term (current) use of antithrombotics/antiplatelets: Secondary | ICD-10-CM | POA: Insufficient documentation

## 2017-04-10 DIAGNOSIS — R42 Dizziness and giddiness: Secondary | ICD-10-CM | POA: Diagnosis not present

## 2017-04-10 DIAGNOSIS — R0602 Shortness of breath: Secondary | ICD-10-CM | POA: Diagnosis not present

## 2017-04-10 DIAGNOSIS — R079 Chest pain, unspecified: Secondary | ICD-10-CM | POA: Diagnosis not present

## 2017-04-10 LAB — CBC
HCT: 38.4 % (ref 36.0–46.0)
HEMOGLOBIN: 12.6 g/dL (ref 12.0–15.0)
MCH: 28.6 pg (ref 26.0–34.0)
MCHC: 32.8 g/dL (ref 30.0–36.0)
MCV: 87.3 fL (ref 78.0–100.0)
PLATELETS: 282 10*3/uL (ref 150–400)
RBC: 4.4 MIL/uL (ref 3.87–5.11)
RDW: 15.9 % — ABNORMAL HIGH (ref 11.5–15.5)
WBC: 9 10*3/uL (ref 4.0–10.5)

## 2017-04-10 LAB — TROPONIN I: Troponin I: <0.03 ng/mL (ref <0.03)

## 2017-04-10 LAB — BASIC METABOLIC PANEL
ANION GAP: 8 (ref 5–15)
BUN: 16 mg/dL (ref 6–20)
CALCIUM: 9.2 mg/dL (ref 8.9–10.3)
CO2: 23 mmol/L (ref 22–32)
Chloride: 110 mmol/L (ref 101–111)
Creatinine, Ser: 1.03 mg/dL — ABNORMAL HIGH (ref 0.44–1.00)
GFR calc non Af Amer: >60 mL/min (ref >60)
GLUCOSE: 100 mg/dL — AB (ref 65–99)
Potassium: 4 mmol/L (ref 3.5–5.1)
Sodium: 141 mmol/L (ref 135–145)

## 2017-04-10 MED ORDER — HYDROCHLOROTHIAZIDE 25 MG PO TABS: 25.0000 mg | ORAL_TABLET | Freq: Every day | ORAL | 0 refills | Status: DC

## 2017-04-10 NOTE — ED Notes (Signed)
Pt ambulated to restroom from room, tolerated well. 

## 2017-04-10 NOTE — ED Notes (Signed)
States she came into the ED today because she has been checking her blood pressure and it was high. Currently not on any blood pressure medication.

## 2017-04-10 NOTE — ED Triage Notes (Signed)
Pt states she checked her BP at home and it was high. She reports she had an aneurysm on 12/09/16. Pt reports some dizziness. BP here is 171/85 in triage. Pt AOX4.

## 2017-04-10 NOTE — ED Provider Notes (Signed)
[facility] EMERGENCY DEPARTMENT Provider Note   CSN: [id number] Arrival date & time: 04/10/17  0734     History   Chief Complaint Chief Complaint  Patient presents with  . Hypertension    HPI Crystal Stafford is a 57 y.o. female.  Patient reports she took her blood pressure last night and it was noted to be 476 systolic.  She also complains of anterior left chest pain onset gradually 8 PM last night which is constant.  Nonexertional.  No associated shortness of breath nausea or sweatiness.  Not made worse with breathing or with walking.  Pain is mild.  She also reports she is felt "dizzy "meaning "wobbly" for the past 2 days.  No headache no visual changes no focal numbness or weakness.  No other associated symptoms.  No treatment prior to coming here.  Nothing makes symptoms better or worse.  HPI  Past Medical History:  Diagnosis Date  . Anxiety   . Arthritis   . Asthma   . Depression   . GERD (gastroesophageal reflux disease)   . Headache   . Hypertension   . MRSA (methicillin resistant Staphylococcus aureus)   . Pre-diabetes     Patient Active Problem List   Diagnosis Date Noted  . Cerebral aneurysm, nonruptured 12/09/2016  . Aneurysm ([place]) 09/29/2016  . 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 Hospital discharge follow-up 10/23/2014  . PICC (peripherally inserted central catheter) removal 10/23/2014  . Sepsis ([place])   . Leukocytosis 10/10/2014  . SIRS (systemic inflammatory response syndrome) ([place]) 10/10/2014  . Impaired glucose tolerance 10/10/2014  . Myalgia   . Essential hypertension 10/21/2013  . Midline low back pain with left-sided sciatica 10/21/2013  . Breast cancer screening 10/21/2013  . Gastroesophageal reflux disease without esophagitis 10/21/2013  . Encounter for screening mammogram for malignant neoplasm of breast 10/21/2013  . Numbness and tingling 05/20/2013  . Shoulder pain, right 02/18/2013  . Morbid obesity ([place])  02/18/2013  . Physical exam, routine 10/25/2012  . Allergy 10/25/2012  . Hypertension 04/06/2012  . Prediabetes 04/06/2012  . Peptic ulcer 04/06/2012  . INSOMNIA 02/04/2010  . SEBACEOUS CYST, SCALP 08/21/2009  . HYPERLIPIDEMIA 06/02/2009  . HEADACHE 06/02/2009  . DEPRESSION 05/19/2009  . FATIGUE 05/19/2009  . 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 TOBACCO ABUSE 11/24/2006  . PELVIC PAIN, RIGHT 11/24/2006  . ASTHMA 11/10/2005    Past Surgical History:  Procedure Laterality Date  . ABDOMINAL HYSTERECTOMY    . IR ANGIO INTRA EXTRACRAN SEL INTERNAL CAROTID BILAT MOD SED  10/20/2016  . IR ANGIO INTRA EXTRACRAN SEL INTERNAL CAROTID BILAT MOD SED  12/09/2016  . IR ANGIO VERTEBRAL SEL VERTEBRAL BILAT MOD SED  10/20/2016  . IR ANGIOGRAM FOLLOW UP STUDY  12/09/2016  . IR TRANSCATH/EMBOLIZ  12/09/2016  . RADIOLOGY WITH ANESTHESIA N/A 12/09/2016   Procedure: Pipeline embolization of aneurysm and possible coiling;  Surgeon: Consuella Lose, MD;  Location: [facility];  Service: Radiology;  Laterality: N/A;    Repair of cerebral aneurysm OB History   None      Home Medications    Prior to Admission medications   Medication Sig Start Date End Date Taking? Authorizing Provider  acetaminophen (TYLENOL) 500 MG tablet Take 2 tablets (1,000 mg total) by mouth every 8 (eight) hours as needed for headache. 01/19/17   Alfonse Spruce, FNP  aspirin 325 MG tablet Take 325 mg by mouth daily.    [provider]  atorvastatin (  LIPITOR) 20 MG tablet Take 1 tablet (20 mg total) by mouth daily. 11/30/16   Alfonse Spruce, FNP  clopidogrel (PLAVIX) 75 MG tablet Take 75 mg by mouth daily.    [provider]  esomeprazole (NEXIUM) 40 MG capsule Take 1 capsule (40 mg total) by mouth daily as needed. Patient not taking: Reported on 01/19/2017 12/15/16   Alfonse Spruce, FNP  gabapentin (NEURONTIN) 100 MG capsule Take 2 capsules (200 mg total) by mouth 3 (three) times  daily. Patient not taking: Reported on 12/02/2016 11/02/15   Lottie Mussel T, MD  gabapentin (NEURONTIN) 300 MG capsule TAKE ONE CAPSULE BY MOUTH THREE TIMES DAILY Patient not taking: Reported on 12/23/2016 12/15/16   Alfonse Spruce, FNP  HYDROcodone-acetaminophen (NORCO/VICODIN) 5-325 MG tablet Take 1 tablet by mouth every 4 (four) hours as needed for moderate pain. Patient not taking: Reported on 01/19/2017 12/10/16   Newman Pies, MD  hydrOXYzine (ATARAX/VISTARIL) 25 MG tablet Take 25 mg by mouth daily as needed for anxiety.    [provider]  metFORMIN (GLUCOPHAGE-XR) 500 MG 24 hr tablet TAKE 1 TABLET BY MOUTH ONCE DAILY WITH BREAKFAST 02/01/17   Alfonse Spruce, FNP  oseltamivir (TAMIFLU) 75 MG capsule Take 1 capsule (75 mg total) by mouth every 12 (twelve) hours. 03/06/17   Jola Schmidt, MD  pramipexole (MIRAPEX) 0.125 MG tablet Take 0.125 mg by mouth at bedtime.    [provider]  tiZANidine (ZANAFLEX) 2 MG tablet Take 1 tablet (2 mg total) by mouth every 8 (eight) hours as needed for muscle spasms. 09/05/16   Tresa Garter, MD  traMADol (ULTRAM) 50 MG tablet Take 1 tablet (50 mg total) by mouth every 8 (eight) hours as needed. Patient not taking: Reported on 03/06/2017 01/19/17   Alfonse Spruce, FNP    Family History Family History  Problem Relation Age of Onset  . Colon cancer Maternal Grandfather 3  . Breast cancer Maternal Aunt     Social History Social History   Tobacco Use  . Smoking status: Current Every Day Smoker    Packs/day: 1.00    Types: Cigarettes  . Smokeless tobacco: Never Used  Substance Use Topics  . Alcohol use: No    Alcohol/week: 0.0 oz  . Drug use: No     Allergies   Doxycycline and Tape   Review of Systems Review of Systems  Constitutional: Negative.   HENT: Negative.   Respiratory: Negative.   Cardiovascular: Positive for chest pain.  Gastrointestinal: Negative.   Musculoskeletal: Negative.     Skin: Negative.   Neurological: Positive for dizziness.  Psychiatric/Behavioral: Negative.   All other systems reviewed and are negative.    Physical Exam Updated Vital Signs BP (!) 134/95 (BP Location: Right Arm)   Pulse 88   Temp (!) 97.4 F (36.3 C) (Oral)   Resp 17   Ht 5\' 4"  (1.626 m)   Wt 74.8 kg (165 lb)   SpO2 98%   BMI 28.32 kg/m   Physical Exam  Constitutional: She is oriented to person, place, and time. She appears well-developed and well-nourished. No distress.  HENT:  Head: Normocephalic and atraumatic.  Eyes: Pupils are equal, round, and reactive to light. Conjunctivae are normal.  Neck: Neck supple. No tracheal deviation present. No thyromegaly present.  Cardiovascular: Normal rate and regular rhythm.  No murmur heard. Pulmonary/Chest: Effort normal and breath sounds normal. She exhibits tenderness.  Chest tender over left anterior aspect, reproducing pain exactly  Abdominal:  Soft. Bowel sounds are normal. She exhibits no distension. There is no tenderness.  Musculoskeletal: Normal range of motion. She exhibits no edema or tenderness.  Neurological: She is alert and oriented to person, place, and time. Coordination normal.  Gait normal Romberg normal pronator drift normal finger to nose normal DTR symmetric bilaterally at knee jerk ankle jerk and biceps toes downward going bilaterally.  Not lightheaded on standing  Skin: Skin is warm and dry. No rash noted.  Psychiatric: She has a normal mood and affect.  Nursing note and vitals reviewed.    ED Treatments / Results  Labs (all labs ordered are listed, but only abnormal results are displayed) Labs Reviewed  BASIC METABOLIC PANEL  CBC  TROPONIN I  Chest x-ray viewed by me Results for orders placed or performed during the hospital encounter of 78/93/81  Basic metabolic panel  Result Value Ref Range   Sodium 141 135 - 145 mmol/L   Potassium 4.0 3.5 - 5.1 mmol/L   Chloride 110 101 - 111 mmol/L   CO2 23  22 - 32 mmol/L   Glucose, Bld 100 (H) 65 - 99 mg/dL   BUN 16 6 - 20 mg/dL   Creatinine, Ser 1.03 (H) 0.44 - 1.00 mg/dL   Calcium 9.2 8.9 - 10.3 mg/dL   GFR calc non Af Amer >60 >60 mL/min   GFR calc Af Amer >60 >60 mL/min   Anion gap 8 5 - 15  CBC  Result Value Ref Range   WBC 9.0 4.0 - 10.5 K/uL   RBC 4.40 3.87 - 5.11 MIL/uL   Hemoglobin 12.6 12.0 - 15.0 g/dL   HCT 38.4 36.0 - 46.0 %   MCV 87.3 78.0 - 100.0 fL   MCH 28.6 26.0 - 34.0 pg   MCHC 32.8 30.0 - 36.0 g/dL   RDW 15.9 (H) 11.5 - 15.5 %   Platelets 282 150 - 400 K/uL  Troponin I  Result Value Ref Range   Troponin I <0.03 <0.03 ng/mL   Dg Chest 2 View  Result Date: 04/10/2017 CLINICAL DATA:  Left chest pain radiating to the left shoulder. Dizziness and some shortness of breath. EXAM: CHEST - 2 VIEW COMPARISON:  03/06/2017 FINDINGS: The cardiomediastinal silhouette is within normal limits. The lungs are well inflated and clear. There is no evidence of pleural effusion or pneumothorax. Lower thoracic levoscoliosis is again noted. IMPRESSION: No active cardiopulmonary disease. Electronically Signed   By: Logan Bores M.D.   On: 04/10/2017 09:04    EKG EKG Interpretation  Date/Time:  Monday April 10 2017 07:53:16 EDT Ventricular Rate:  92 PR Interval:  160 QRS Duration: 84 QT Interval:  362 QTC Calculation: 447 R Axis:   51 Text Interpretation:  Normal sinus rhythm Septal infarct , age undetermined Abnormal ECG No significant change since last tracing Confirmed by Orlie Dakin [phone]) on 04/10/2017 8:37:15 AM   Radiology No results found.  Procedures Procedures (including critical care time)  Medications Ordered in ED Medications - No data to display  Chest xray viewed by me Initial Impression / Assessment and Plan / ED Course  I have reviewed the triage vital signs and the nursing notes.  Pertinent labs & imaging results that were available during my care of the patient were reviewed by me and considered in my  medical decision making (see chart for details).     Patient declines pain medicine.  11 AM she continues to complain of chest pain.  Chest pain is most consistent  with chest wall pain.  Strongly doubt cardiac etiology.  Pain easily reproducible.  Normal troponin, normal EKG.  Heart score equals 3.  Plan  back to primary care physician Dr. Dema Severin.  I counseled patient for 5 minutes on smoking cessation.  Prescription HCTZ.  Blood pressure recheck 3 weeks  Final Clinical Impressions(s) / ED Diagnoses   #1 chest wall pain #2 elevated blood pressure Final diagnoses:  None  3 tobacco abuse  ED Discharge Orders    None       Orlie Dakin, MD 04/10/17 1110

## 2017-04-10 NOTE — ED Notes (Signed)
Pt ambulated to room from waiting room, tolerated well. 

## 2017-04-10 NOTE — Discharge Instructions (Signed)
Take Tylenol as needed for pain.  Take the blood pressure medication prescribed starting today.  Call your primary care physician today to arrange to get your blood pressure rechecked within the next 3 weeks.  Today's was elevated at 139/90.  Ask your doctor to help you to stop smoking

## 2017-04-10 NOTE — ED Notes (Signed)
C/o left anterior chest pain states it is worse with movement or when taking a deep breath

## 2017-04-17 DIAGNOSIS — F17200 Nicotine dependence, unspecified, uncomplicated: Secondary | ICD-10-CM | POA: Diagnosis not present

## 2017-04-17 DIAGNOSIS — I10 Essential (primary) hypertension: Secondary | ICD-10-CM | POA: Diagnosis not present

## 2017-04-20 DIAGNOSIS — F331 Major depressive disorder, recurrent, moderate: Secondary | ICD-10-CM | POA: Diagnosis not present

## 2017-05-06 ENCOUNTER — Emergency Department (HOSPITAL_COMMUNITY): Payer: Medicare HMO

## 2017-05-06 ENCOUNTER — Emergency Department (HOSPITAL_COMMUNITY)
Admission: EM | Admit: 2017-05-06 | Discharge: 2017-05-07 | Disposition: A | Discharge status: Home / Self Care | Payer: Medicare HMO | Attending: Emergency Medicine | Admitting: Emergency Medicine

## 2017-05-06 DIAGNOSIS — N289 Disorder of kidney and ureter, unspecified: Secondary | ICD-10-CM | POA: Diagnosis not present

## 2017-05-06 DIAGNOSIS — S299XXA Unspecified injury of thorax, initial encounter: Secondary | ICD-10-CM | POA: Diagnosis not present

## 2017-05-06 DIAGNOSIS — Z8679 Personal history of other diseases of the circulatory system: Secondary | ICD-10-CM | POA: Diagnosis not present

## 2017-05-06 DIAGNOSIS — R7989 Other specified abnormal findings of blood chemistry: Secondary | ICD-10-CM | POA: Diagnosis not present

## 2017-05-06 DIAGNOSIS — E876 Hypokalemia: Secondary | ICD-10-CM | POA: Diagnosis not present

## 2017-05-06 DIAGNOSIS — S21112A Laceration without foreign body of left front wall of thorax without penetration into thoracic cavity, initial encounter: Secondary | ICD-10-CM | POA: Insufficient documentation

## 2017-05-06 DIAGNOSIS — E872 Acidosis: Secondary | ICD-10-CM | POA: Diagnosis not present

## 2017-05-06 DIAGNOSIS — S21332A Puncture wound without foreign body of left front wall of thorax with penetration into thoracic cavity, initial encounter: Secondary | ICD-10-CM | POA: Diagnosis not present

## 2017-05-06 DIAGNOSIS — Z23 Encounter for immunization: Secondary | ICD-10-CM | POA: Insufficient documentation

## 2017-05-06 DIAGNOSIS — T502X5A Adverse effect of carbonic-anhydrase inhibitors, benzothiadiazides and other diuretics, initial encounter: Secondary | ICD-10-CM | POA: Diagnosis not present

## 2017-05-06 DIAGNOSIS — S21012A Laceration without foreign body of left breast, initial encounter: Secondary | ICD-10-CM | POA: Diagnosis not present

## 2017-05-06 DIAGNOSIS — Z7902 Long term (current) use of antithrombotics/antiplatelets: Secondary | ICD-10-CM | POA: Insufficient documentation

## 2017-05-06 DIAGNOSIS — Y929 Unspecified place or not applicable: Secondary | ICD-10-CM | POA: Insufficient documentation

## 2017-05-06 DIAGNOSIS — Y939 Activity, unspecified: Secondary | ICD-10-CM | POA: Insufficient documentation

## 2017-05-06 DIAGNOSIS — T148XXA Other injury of unspecified body region, initial encounter: Secondary | ICD-10-CM | POA: Diagnosis not present

## 2017-05-06 DIAGNOSIS — Y999 Unspecified external cause status: Secondary | ICD-10-CM | POA: Diagnosis not present

## 2017-05-06 DIAGNOSIS — R0781 Pleurodynia: Secondary | ICD-10-CM | POA: Diagnosis not present

## 2017-05-06 LAB — I-STAT CG4 LACTIC ACID, ED: Lactic Acid, Venous: 3.17 mmol/L (ref 0.5–1.9)

## 2017-05-06 LAB — I-STAT CHEM 8, ED
BUN: 24 mg/dL — AB (ref 6–20)
CHLORIDE: 104 mmol/L (ref 101–111)
CREATININE: 1.6 mg/dL — AB (ref 0.44–1.00)
Calcium, Ion: 1.08 mmol/L — ABNORMAL LOW (ref 1.15–1.40)
Glucose, Bld: 177 mg/dL — ABNORMAL HIGH (ref 65–99)
HEMATOCRIT: 40 % (ref 36.0–46.0)
Hemoglobin: 13.6 g/dL (ref 12.0–15.0)
POTASSIUM: 3.2 mmol/L — AB (ref 3.5–5.1)
Sodium: 139 mmol/L (ref 135–145)
TCO2: 20 mmol/L — ABNORMAL LOW (ref 22–32)

## 2017-05-06 MED ORDER — IOPAMIDOL (ISOVUE-300) INJECTION 61%
INTRAVENOUS | Status: AC
  Filled 2017-05-06: qty 100

## 2017-05-06 MED ORDER — TETANUS-DIPHTH-ACELL PERTUSSIS 5-2.5-18.5 LF-MCG/0.5 IM SUSP
0.5000 mL | Freq: Once | INTRAMUSCULAR | Status: AC
  Administered 2017-05-07: 0.5 mL via INTRAMUSCULAR
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
## 2017-05-07 ENCOUNTER — Emergency Department (HOSPITAL_COMMUNITY): Payer: Medicare HMO

## 2017-05-07 ENCOUNTER — Other Ambulatory Visit: Payer: Self-pay

## 2017-05-07 ENCOUNTER — Encounter (HOSPITAL_COMMUNITY): Payer: Self-pay | Admitting: Emergency Medicine

## 2017-05-07 DIAGNOSIS — S21332A Puncture wound without foreign body of left front wall of thorax with penetration into thoracic cavity, initial encounter: Secondary | ICD-10-CM | POA: Diagnosis not present

## 2017-05-07 LAB — DIFFERENTIAL
BASOS ABS: 0 10*3/uL (ref 0.0–0.1)
Basophils Relative: 0 %
EOS ABS: 0.1 10*3/uL (ref 0.0–0.7)
Eosinophils Relative: 1 %
Lymphocytes Relative: 54 %
Lymphs Abs: 7.8 10*3/uL — ABNORMAL HIGH (ref 0.7–4.0)
MONO ABS: 0.7 10*3/uL (ref 0.1–1.0)
Monocytes Relative: 5 %
NEUTROS PCT: 40 %
Neutro Abs: 5.8 10*3/uL (ref 1.7–7.7)

## 2017-05-07 LAB — TYPE AND SCREEN
ABO/RH(D): O POS
Antibody Screen: NEGATIVE
UNIT DIVISION: 0
UNIT DIVISION: 0

## 2017-05-07 LAB — BPAM RBC
BLOOD PRODUCT EXPIRATION DATE: 201905302359
Blood Product Expiration Date: 201905302359
ISSUE DATE / TIME: 201904272341
ISSUE DATE / TIME: 201904272341
UNIT TYPE AND RH: 9500
Unit Type and Rh: 9500

## 2017-05-07 LAB — CBC
HEMATOCRIT: 38 % (ref 36.0–46.0)
HEMOGLOBIN: 12.7 g/dL (ref 12.0–15.0)
MCH: 28.7 pg (ref 26.0–34.0)
MCHC: 33.4 g/dL (ref 30.0–36.0)
MCV: 85.8 fL (ref 78.0–100.0)
Platelets: 267 10*3/uL (ref 150–400)
RBC: 4.43 MIL/uL (ref 3.87–5.11)
RDW: 16.6 % — ABNORMAL HIGH (ref 11.5–15.5)
WBC: 14.4 10*3/uL — AB (ref 4.0–10.5)

## 2017-05-07 LAB — COMPREHENSIVE METABOLIC PANEL
ALBUMIN: 3.9 g/dL (ref 3.5–5.0)
ALT: 18 U/L (ref 14–54)
ANION GAP: 12 (ref 5–15)
AST: 24 U/L (ref 15–41)
Alkaline Phosphatase: 84 U/L (ref 38–126)
BUN: 25 mg/dL — ABNORMAL HIGH (ref 6–20)
CO2: 18 mmol/L — ABNORMAL LOW (ref 22–32)
Calcium: 9.3 mg/dL (ref 8.9–10.3)
Chloride: 105 mmol/L (ref 101–111)
Creatinine, Ser: 1.58 mg/dL — ABNORMAL HIGH (ref 0.44–1.00)
GFR calc non Af Amer: 36 mL/min — ABNORMAL LOW (ref >60)
GFR, EST AFRICAN AMERICAN: 42 mL/min — AB (ref >60)
GLUCOSE: 179 mg/dL — AB (ref 65–99)
POTASSIUM: 3.3 mmol/L — AB (ref 3.5–5.1)
Sodium: 135 mmol/L (ref 135–145)
Total Bilirubin: 0.5 mg/dL (ref 0.3–1.2)
Total Protein: 7.2 g/dL (ref 6.5–8.1)

## 2017-05-07 LAB — PREPARE FRESH FROZEN PLASMA: Unit division: 0

## 2017-05-07 LAB — PROTIME-INR
INR: 0.98
PROTHROMBIN TIME: 12.9 s (ref 11.4–15.2)

## 2017-05-07 LAB — BPAM FFP
BLOOD PRODUCT EXPIRATION DATE: 201904302359
Blood Product Expiration Date: 201904302359
ISSUE DATE / TIME: 201904272345
ISSUE DATE / TIME: 201904272345
Unit Type and Rh: 6200
Unit Type and Rh: 6200

## 2017-05-07 LAB — I-STAT BETA HCG BLOOD, ED (MC, WL, AP ONLY): I-stat hCG, quantitative: <5 m[IU]/mL (ref <5)

## 2017-05-07 LAB — CDS SEROLOGY

## 2017-05-07 LAB — LACTIC ACID, PLASMA: LACTIC ACID, VENOUS: 2.1 mmol/L — AB (ref 0.5–1.9)

## 2017-05-07 LAB — ABO/RH: ABO/RH(D): O POS

## 2017-05-07 LAB — ETHANOL: Alcohol, Ethyl (B): <10 mg/dL (ref <10)

## 2017-05-07 MED ORDER — TRAMADOL HCL 50 MG PO TABS
50.0000 mg | ORAL_TABLET | Freq: Once | ORAL | Status: AC
  Administered 2017-05-07: 50 mg via ORAL
  Filled 2017-05-07: qty 1

## 2017-05-07 MED ORDER — TETANUS-DIPHTH-ACELL PERTUSSIS 5-2.5-18.5 LF-MCG/0.5 IM SUSP
INTRAMUSCULAR | Status: AC
  Filled 2017-05-07: qty 0.5

## 2017-05-07 MED ORDER — SODIUM CHLORIDE 0.9 % IV BOLUS
1000.0000 mL | Freq: Once | INTRAVENOUS | Status: AC
  Administered 2017-05-06: 1000 mL via INTRAVENOUS

## 2017-05-07 MED ORDER — POTASSIUM CHLORIDE CRYS ER 20 MEQ PO TBCR
40.0000 meq | EXTENDED_RELEASE_TABLET | Freq: Once | ORAL | Status: AC
  Administered 2017-05-07: 40 meq via ORAL
  Filled 2017-05-07: qty 2

## 2017-05-07 MED ORDER — TRAMADOL HCL 50 MG PO TABS: 50.0000 mg | ORAL_TABLET | Freq: Four times a day (QID) | ORAL | 0 refills | Status: DC | PRN

## 2017-05-07 MED ORDER — IOPAMIDOL (ISOVUE-300) INJECTION 61%
100.0000 mL | Freq: Once | INTRAVENOUS | Status: AC | PRN
  Administered 2017-05-07: 100 mL via INTRAVENOUS

## 2017-05-07 MED ORDER — POTASSIUM CHLORIDE CRYS ER 20 MEQ PO TBCR: 20.0000 meq | EXTENDED_RELEASE_TABLET | Freq: Every day | ORAL | 0 refills | Status: DC

## 2017-05-07 NOTE — Discharge Instructions (Signed)
Your creatinine (blood test of your kidneys) is worse today than it was four weeks ago (has gone from 1.03 on 04/10/2017 to 1.58 tonight). Please make sure to drink plenty of fluids. Have your doctor recheck the blood test in 1-2 weeks to make sure your kidneys are not getting worse.   Do not take ibuprofen or naproxen, because they can harm your kidneys.

## 2017-05-07 NOTE — ED Notes (Signed)
Patient returned from CT

## 2017-05-07 NOTE — ED Notes (Signed)
Date and time results received: 05/07/17 0307 (use smartphrase ".now" to insert current time)  Test: lactic acid Critical Value: 2.1  Name of Provider Notified: Dr. Roxanne Mins  Orders Received? Or Actions Taken?: no new orders
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
## 2017-05-07 NOTE — ED Notes (Signed)
To CT with RN.

## 2017-05-08 LAB — PATHOLOGIST SMEAR REVIEW: Path Review: REACTIVE

## 2017-05-10 ENCOUNTER — Encounter (HOSPITAL_COMMUNITY): Payer: Self-pay

## 2017-05-13 ENCOUNTER — Other Ambulatory Visit: Payer: Self-pay

## 2017-05-13 ENCOUNTER — Emergency Department (HOSPITAL_COMMUNITY)
Admission: EM | Admit: 2017-05-13 | Discharge: 2017-05-13 | Disposition: A | Discharge status: Home / Self Care | Payer: Medicare HMO | Attending: Emergency Medicine | Admitting: Emergency Medicine

## 2017-05-13 ENCOUNTER — Encounter (HOSPITAL_COMMUNITY): Payer: Self-pay | Admitting: Emergency Medicine

## 2017-05-13 DIAGNOSIS — Z7982 Long term (current) use of aspirin: Secondary | ICD-10-CM | POA: Diagnosis not present

## 2017-05-13 DIAGNOSIS — Z7902 Long term (current) use of antithrombotics/antiplatelets: Secondary | ICD-10-CM | POA: Diagnosis not present

## 2017-05-13 DIAGNOSIS — Z4889 Encounter for other specified surgical aftercare: Secondary | ICD-10-CM | POA: Insufficient documentation

## 2017-05-13 DIAGNOSIS — N644 Mastodynia: Secondary | ICD-10-CM | POA: Insufficient documentation

## 2017-05-13 DIAGNOSIS — T8189XA Other complications of procedures, not elsewhere classified, initial encounter: Secondary | ICD-10-CM | POA: Diagnosis not present

## 2017-05-13 DIAGNOSIS — S21012D Laceration without foreign body of left breast, subsequent encounter: Secondary | ICD-10-CM | POA: Diagnosis not present

## 2017-05-13 DIAGNOSIS — Z7984 Long term (current) use of oral hypoglycemic drugs: Secondary | ICD-10-CM | POA: Insufficient documentation

## 2017-05-13 DIAGNOSIS — Z5189 Encounter for other specified aftercare: Secondary | ICD-10-CM

## 2017-05-13 DIAGNOSIS — J45909 Unspecified asthma, uncomplicated: Secondary | ICD-10-CM | POA: Diagnosis not present

## 2017-05-13 DIAGNOSIS — I10 Essential (primary) hypertension: Secondary | ICD-10-CM | POA: Diagnosis not present

## 2017-05-13 DIAGNOSIS — Z4800 Encounter for change or removal of nonsurgical wound dressing: Secondary | ICD-10-CM | POA: Diagnosis not present

## 2017-05-13 MED ORDER — CEPHALEXIN 500 MG PO CAPS: 500.0000 mg | ORAL_CAPSULE | Freq: Three times a day (TID) | ORAL | 0 refills | Status: DC

## 2017-05-13 MED ORDER — TRAMADOL HCL 50 MG PO TABS: 50.0000 mg | ORAL_TABLET | Freq: Four times a day (QID) | ORAL | 0 refills | Status: DC | PRN

## 2017-05-13 NOTE — Discharge Instructions (Signed)
Please have your surgical staples removed in 4 days. Take antibiotic as prescribed.  Take pain medication as needed.  Wear bra with good support decrease the pressure at the laceration site.  Return if you have any concerns.
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
## 2017-05-17 ENCOUNTER — Emergency Department (HOSPITAL_COMMUNITY)
Admission: EM | Admit: 2017-05-17 | Discharge: 2017-05-17 | Discharge status: Against Medical Advice | Payer: Medicare HMO | Attending: Emergency Medicine | Admitting: Emergency Medicine

## 2017-05-17 ENCOUNTER — Encounter (HOSPITAL_COMMUNITY): Payer: Self-pay

## 2017-05-17 DIAGNOSIS — Z4802 Encounter for removal of sutures: Secondary | ICD-10-CM | POA: Insufficient documentation

## 2017-05-17 DIAGNOSIS — Z5321 Procedure and treatment not carried out due to patient leaving prior to being seen by health care provider: Secondary | ICD-10-CM | POA: Insufficient documentation

## 2017-05-17 NOTE — ED Notes (Signed)
Pt states she is leaving because she has to go pick up her granddaughter. Pt states she is unable to wait any longer. Pt refused VS recheck.

## 2017-05-17 NOTE — ED Triage Notes (Signed)
Patient here to have staples remove from upper left breast, in place x 2 weeks. No complaints

## 2017-05-18 ENCOUNTER — Ambulatory Visit (HOSPITAL_COMMUNITY)
Admission: EM | Admit: 2017-05-18 | Discharge: 2017-05-18 | Disposition: A | Discharge status: Home / Self Care | Payer: Medicare HMO

## 2017-05-18 DIAGNOSIS — F331 Major depressive disorder, recurrent, moderate: Secondary | ICD-10-CM | POA: Diagnosis not present

## 2017-05-18 NOTE — ED Triage Notes (Signed)
Pt here for staple removal. Removed 9 staples from breast. Wound clean and dry.

## 2017-05-29 DIAGNOSIS — F4321 Adjustment disorder with depressed mood: Secondary | ICD-10-CM | POA: Diagnosis not present

## 2017-05-31 ENCOUNTER — Other Ambulatory Visit: Payer: Self-pay | Admitting: Neurosurgery

## 2017-05-31 DIAGNOSIS — I10 Essential (primary) hypertension: Secondary | ICD-10-CM | POA: Diagnosis not present

## 2017-05-31 DIAGNOSIS — R7303 Prediabetes: Secondary | ICD-10-CM | POA: Diagnosis not present

## 2017-05-31 DIAGNOSIS — I671 Cerebral aneurysm, nonruptured: Secondary | ICD-10-CM

## 2017-06-08 DIAGNOSIS — F331 Major depressive disorder, recurrent, moderate: Secondary | ICD-10-CM | POA: Diagnosis not present

## 2017-06-15 ENCOUNTER — Other Ambulatory Visit: Payer: Self-pay | Admitting: Neurosurgery

## 2017-06-16 ENCOUNTER — Encounter (HOSPITAL_COMMUNITY): Payer: Self-pay

## 2017-06-16 ENCOUNTER — Ambulatory Visit (HOSPITAL_COMMUNITY)
Admission: RE | Admit: 2017-06-16 | Discharge: 2017-06-16 | Disposition: A | Discharge status: Home / Self Care | Payer: Medicare HMO | Source: Ambulatory Visit | Attending: Neurosurgery | Admitting: Neurosurgery

## 2017-06-16 ENCOUNTER — Other Ambulatory Visit: Payer: Self-pay | Admitting: Neurosurgery

## 2017-06-16 DIAGNOSIS — Z8679 Personal history of other diseases of the circulatory system: Secondary | ICD-10-CM | POA: Diagnosis not present

## 2017-06-16 DIAGNOSIS — Z48812 Encounter for surgical aftercare following surgery on the circulatory system: Secondary | ICD-10-CM | POA: Insufficient documentation

## 2017-06-16 DIAGNOSIS — I671 Cerebral aneurysm, nonruptured: Secondary | ICD-10-CM

## 2017-06-16 DIAGNOSIS — J45909 Unspecified asthma, uncomplicated: Secondary | ICD-10-CM | POA: Diagnosis not present

## 2017-06-16 DIAGNOSIS — F17210 Nicotine dependence, cigarettes, uncomplicated: Secondary | ICD-10-CM | POA: Diagnosis not present

## 2017-06-16 DIAGNOSIS — M5442 Lumbago with sciatica, left side: Secondary | ICD-10-CM | POA: Insufficient documentation

## 2017-06-16 DIAGNOSIS — Z7902 Long term (current) use of antithrombotics/antiplatelets: Secondary | ICD-10-CM | POA: Diagnosis not present

## 2017-06-16 DIAGNOSIS — F329 Major depressive disorder, single episode, unspecified: Secondary | ICD-10-CM | POA: Diagnosis not present

## 2017-06-16 DIAGNOSIS — K219 Gastro-esophageal reflux disease without esophagitis: Secondary | ICD-10-CM | POA: Insufficient documentation

## 2017-06-16 DIAGNOSIS — Z7982 Long term (current) use of aspirin: Secondary | ICD-10-CM | POA: Diagnosis not present

## 2017-06-16 DIAGNOSIS — Z8614 Personal history of Methicillin resistant Staphylococcus aureus infection: Secondary | ICD-10-CM | POA: Diagnosis not present

## 2017-06-16 DIAGNOSIS — N3946 Mixed incontinence: Secondary | ICD-10-CM | POA: Insufficient documentation

## 2017-06-16 DIAGNOSIS — E785 Hyperlipidemia, unspecified: Secondary | ICD-10-CM | POA: Insufficient documentation

## 2017-06-16 DIAGNOSIS — Z7984 Long term (current) use of oral hypoglycemic drugs: Secondary | ICD-10-CM | POA: Diagnosis not present

## 2017-06-16 DIAGNOSIS — F419 Anxiety disorder, unspecified: Secondary | ICD-10-CM | POA: Diagnosis not present

## 2017-06-16 DIAGNOSIS — Z8774 Personal history of (corrected) congenital malformations of heart and circulatory system: Secondary | ICD-10-CM | POA: Insufficient documentation

## 2017-06-16 DIAGNOSIS — M1990 Unspecified osteoarthritis, unspecified site: Secondary | ICD-10-CM | POA: Diagnosis not present

## 2017-06-16 DIAGNOSIS — R7303 Prediabetes: Secondary | ICD-10-CM | POA: Insufficient documentation

## 2017-06-16 HISTORY — PX: IR ANGIO INTRA EXTRACRAN SEL INTERNAL CAROTID BILAT MOD SED: IMG5363

## 2017-06-16 HISTORY — PX: IR US GUIDE VASC ACCESS RIGHT: IMG2390

## 2017-06-16 HISTORY — PX: IR ANGIO VERTEBRAL SEL VERTEBRAL BILAT MOD SED: IMG5369

## 2017-06-16 LAB — CBC WITH DIFFERENTIAL/PLATELET
Abs Immature Granulocytes: 0 10*3/uL (ref 0.0–0.1)
BASOS PCT: 0 %
Basophils Absolute: 0 10*3/uL (ref 0.0–0.1)
Eosinophils Absolute: 0.1 10*3/uL (ref 0.0–0.7)
Eosinophils Relative: 1 %
HEMATOCRIT: 36.5 % (ref 36.0–46.0)
HEMOGLOBIN: 11.6 g/dL — AB (ref 12.0–15.0)
IMMATURE GRANULOCYTES: 0 %
LYMPHS ABS: 3.3 10*3/uL (ref 0.7–4.0)
LYMPHS PCT: 42 %
MCH: 28.5 pg (ref 26.0–34.0)
MCHC: 31.8 g/dL (ref 30.0–36.0)
MCV: 89.7 fL (ref 78.0–100.0)
MONO ABS: 0.6 10*3/uL (ref 0.1–1.0)
Monocytes Relative: 7 %
NEUTROS PCT: 48 %
Neutro Abs: 3.7 10*3/uL (ref 1.7–7.7)
PLATELETS: 290 10*3/uL (ref 150–400)
RBC: 4.07 MIL/uL (ref 3.87–5.11)
RDW: 17.9 % — ABNORMAL HIGH (ref 11.5–15.5)
WBC: 7.7 10*3/uL (ref 4.0–10.5)

## 2017-06-16 LAB — BASIC METABOLIC PANEL
ANION GAP: 9 (ref 5–15)
BUN: 9 mg/dL (ref 6–20)
CALCIUM: 9.1 mg/dL (ref 8.9–10.3)
CHLORIDE: 111 mmol/L (ref 101–111)
CO2: 22 mmol/L (ref 22–32)
Creatinine, Ser: 1.15 mg/dL — ABNORMAL HIGH (ref 0.44–1.00)
GFR calc non Af Amer: 53 mL/min — ABNORMAL LOW (ref >60)
GLUCOSE: 91 mg/dL (ref 65–99)
POTASSIUM: 4.2 mmol/L (ref 3.5–5.1)
Sodium: 142 mmol/L (ref 135–145)

## 2017-06-16 LAB — PROTIME-INR
INR: 1.04
Prothrombin Time: 13.5 seconds (ref 11.4–15.2)

## 2017-06-16 LAB — APTT: aPTT: 30 seconds (ref 24–36)

## 2017-06-16 MED ORDER — HEPARIN SODIUM (PORCINE) 1000 UNIT/ML IJ SOLN
INTRAMUSCULAR | Status: AC
  Filled 2017-06-16: qty 1

## 2017-06-16 MED ORDER — IOHEXOL 300 MG/ML  SOLN: 35.0000 mL | Freq: Once | INTRAMUSCULAR | Status: DC | PRN

## 2017-06-16 MED ORDER — SODIUM CHLORIDE 0.9 % IV SOLN: INTRAVENOUS | Status: DC

## 2017-06-16 MED ORDER — HEPARIN SODIUM (PORCINE) 1000 UNIT/ML IJ SOLN
INTRAMUSCULAR | Status: AC | PRN
  Administered 2017-06-16: 2000 [IU] via INTRAVENOUS

## 2017-06-16 MED ORDER — LIDOCAINE HCL (PF) 1 % IJ SOLN
INTRAMUSCULAR | Status: AC | PRN
  Administered 2017-06-16: 10 mL

## 2017-06-16 MED ORDER — MIDAZOLAM HCL 2 MG/2ML IJ SOLN
INTRAMUSCULAR | Status: AC | PRN
  Administered 2017-06-16: 1 mg via INTRAVENOUS

## 2017-06-16 MED ORDER — CEFAZOLIN SODIUM-DEXTROSE 2-4 GM/100ML-% IV SOLN: 2.0000 g | INTRAVENOUS | Status: DC

## 2017-06-16 MED ORDER — LIDOCAINE HCL 1 % IJ SOLN
INTRAMUSCULAR | Status: AC
  Filled 2017-06-16: qty 20

## 2017-06-16 MED ORDER — CHLORHEXIDINE GLUCONATE CLOTH 2 % EX PADS: 6.0000 | MEDICATED_PAD | Freq: Once | CUTANEOUS | Status: DC

## 2017-06-16 MED ORDER — HYDROCODONE-ACETAMINOPHEN 5-325 MG PO TABS: 1.0000 | ORAL_TABLET | ORAL | Status: DC | PRN

## 2017-06-16 MED ORDER — FENTANYL CITRATE (PF) 100 MCG/2ML IJ SOLN
INTRAMUSCULAR | Status: AC | PRN
  Administered 2017-06-16: 25 ug via INTRAVENOUS

## 2017-06-16 MED ORDER — FENTANYL CITRATE (PF) 100 MCG/2ML IJ SOLN
INTRAMUSCULAR | Status: AC
  Filled 2017-06-16: qty 2

## 2017-06-16 MED ORDER — MIDAZOLAM HCL 2 MG/2ML IJ SOLN
INTRAMUSCULAR | Status: AC
  Filled 2017-06-16: qty 2

## 2017-06-16 NOTE — Progress Notes (Signed)
Per Dr. Kathyrn Sheriff

## 2017-06-16 NOTE — Sedation Documentation (Signed)
Pressure removed from groin site. Incision is clean and dry with no signs of bleeding.

## 2017-06-16 NOTE — H&P (Signed)
Chief Complaint   Aneurysm   HPI   HPI: Crystal Stafford is a 57 y.o. 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 She had an unremarkable post operative course and has been doing well since stenting. She presents today for follow up diagnostic angiogram. She is without any concerns today.  Patient Active Problem List   Diagnosis Date Noted  . Cerebral aneurysm, nonruptured 12/09/2016  . Aneurysm ([place]) 09/29/2016  . 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 Hospital discharge follow-up 10/23/2014  . PICC (peripherally inserted central catheter) removal 10/23/2014  . Sepsis ([place])   . Leukocytosis 10/10/2014  . SIRS (systemic inflammatory response syndrome) ([place]) 10/10/2014  . Impaired glucose tolerance 10/10/2014  . Myalgia   . Essential hypertension 10/21/2013  . Midline low back pain with left-sided sciatica 10/21/2013  . Breast cancer screening 10/21/2013  . Gastroesophageal reflux disease without esophagitis 10/21/2013  . Encounter for screening mammogram for malignant neoplasm of breast 10/21/2013  . Numbness and tingling 05/20/2013  . Shoulder pain, right 02/18/2013  . Morbid obesity ([place]) 02/18/2013  . Physical exam, routine 10/25/2012  . Allergy 10/25/2012  . Hypertension 04/06/2012  . Prediabetes 04/06/2012  . Peptic ulcer 04/06/2012  . INSOMNIA 02/04/2010  . SEBACEOUS CYST, SCALP 08/21/2009  . HYPERLIPIDEMIA 06/02/2009  . HEADACHE 06/02/2009  . DEPRESSION 05/19/2009  . FATIGUE 05/19/2009  . 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 TOBACCO ABUSE 11/24/2006  . PELVIC PAIN, RIGHT 11/24/2006  . ASTHMA 11/10/2005    PMH: Past Medical History:  Diagnosis Date  . Anxiety   . Arthritis   . Depression   . GERD (gastroesophageal reflux disease)   . Headache   . Hypertension   . MRSA (methicillin resistant Staphylococcus aureus)   . Pre-diabetes     PSH: Past Surgical History:  Procedure Laterality Date  .  ABDOMINAL HYSTERECTOMY    . IR ANGIO INTRA EXTRACRAN SEL INTERNAL CAROTID BILAT MOD SED  10/20/2016  . IR ANGIO INTRA EXTRACRAN SEL INTERNAL CAROTID BILAT MOD SED  12/09/2016  . IR ANGIO VERTEBRAL SEL VERTEBRAL BILAT MOD SED  10/20/2016  . IR ANGIOGRAM FOLLOW UP STUDY  12/09/2016  . IR TRANSCATH/EMBOLIZ  12/09/2016  . RADIOLOGY WITH ANESTHESIA N/A 12/09/2016   Procedure: Pipeline embolization of aneurysm and possible coiling;  Surgeon: Consuella Lose, MD;  Location: [facility];  Service: Radiology;  Laterality: N/A;     (Not in a hospital admission)  SH: Social History   Tobacco Use  . Smoking status: Current Every Day Smoker    Packs/day: 0.50  . Smokeless tobacco: Never Used  Substance Use Topics  . Alcohol use: Never    Frequency: Never  . Drug use: Never    MEDS: Prior to Admission medications   Medication Sig Start Date End Date Taking? Authorizing Provider  acetaminophen (TYLENOL) 500 MG tablet Take 2 tablets (1,000 mg total) by mouth every 8 (eight) hours as needed for headache. 01/19/17  Yes Hairston, Toy Baker R, FNP  aspirin 325 MG tablet Take 325 mg by mouth daily.   Yes [provider]  aspirin EC 325 MG tablet Take 325 mg by mouth daily.   Yes [provider]  atorvastatin (LIPITOR) 20 MG tablet Take 1 tablet (20 mg total) by mouth daily. 11/30/16  Yes Fredia Beets R, FNP  clopidogrel (PLAVIX) 75 MG tablet Take 75 mg by mouth daily.   Yes [provider]  esomeprazole (NEXIUM) 40 MG capsule Take  1 capsule (40 mg total) by mouth daily as needed. 12/15/16  Yes Hairston, Maylon Peppers, FNP  gabapentin (NEURONTIN) 100 MG capsule Take 2 capsules (200 mg total) by mouth 3 (three) times daily. 11/02/15  Yes Langeland, Dawn T, MD  hydrochlorothiazide (HYDRODIURIL) 25 MG tablet Take 1 tablet (25 mg total) by mouth daily. 04/10/17  Yes Orlie Dakin, MD  hydrOXYzine (ATARAX/VISTARIL) 25 MG tablet Take 25 mg by mouth daily as needed for anxiety.   Yes  [provider]  metFORMIN (GLUCOPHAGE-XR) 500 MG 24 hr tablet TAKE 1 TABLET BY MOUTH ONCE DAILY WITH BREAKFAST 02/01/17  Yes Hairston, Mandesia R, FNP  potassium chloride SA (K-DUR,KLOR-CON) 20 MEQ tablet Take 1 tablet (20 mEq total) by mouth daily. 8/93/81  Yes Delora Fuel, MD  pramipexole (MIRAPEX) 0.125 MG tablet Take 0.125 mg by mouth at bedtime.   Yes [provider]  ranitidine (ZANTAC) 300 MG tablet Take 300 mg by mouth daily. 03/30/17  Yes [provider]  tiZANidine (ZANAFLEX) 2 MG tablet Take 1 tablet (2 mg total) by mouth every 8 (eight) hours as needed for muscle spasms. 09/05/16  Yes Tresa Garter, MD  traMADol (ULTRAM) 50 MG tablet Take 1 tablet (50 mg total) by mouth every 6 (six) hours as needed for moderate pain. 05/13/17  Yes Domenic Moras, PA-C  atorvastatin (LIPITOR) 20 MG tablet Take 20 mg by mouth daily.    [provider]  cephALEXin (KEFLEX) 500 MG capsule Take 1 capsule (500 mg total) by mouth 3 (three) times daily. 05/13/17   Domenic Moras, PA-C  clopidogrel (PLAVIX) 75 MG tablet Take 75 mg by mouth daily.    [provider]  esomeprazole (NEXIUM) 40 MG capsule Take 40 mg by mouth daily as needed (acid reflux).    [provider]  gabapentin (NEURONTIN) 300 MG capsule TAKE ONE CAPSULE BY MOUTH THREE TIMES DAILY Patient not taking: Reported on 12/23/2016 12/15/16   Alfonse Spruce, FNP  hydrochlorothiazide (HYDRODIURIL) 25 MG tablet Take 25 mg by mouth daily.    [provider]  HYDROcodone-acetaminophen (NORCO/VICODIN) 5-325 MG tablet Take 1 tablet by mouth every 4 (four) hours as needed for moderate pain. Patient not taking: Reported on 01/19/2017 12/10/16   Newman Pies, MD  metFORMIN (GLUCOPHAGE-XR) 500 MG 24 hr tablet Take 500 mg by mouth daily with breakfast.    [provider]  pramipexole (MIRAPEX) 0.125 MG tablet Take 0.125 mg by mouth at bedtime as needed (restless leg).    [provider]  tiZANidine (ZANAFLEX) 2 MG tablet Take 2 mg by mouth every 6 (six) hours as needed for muscle spasms.    [provider]    ALLERGY: Allergies  Allergen Reactions  . Doxycycline Shortness Of Breath, Swelling and Rash    Face swelling  . Doxycycline Rash  . Tape Itching and Rash    Social History   Tobacco Use  . Smoking status: Current Every Day Smoker    Packs/day: 0.50  . Smokeless tobacco: Never Used  Substance Use Topics  . Alcohol use: Never    Frequency: Never     Family History  Problem Relation Age of Onset  . Colon cancer Maternal Grandfather 54  . Breast cancer Maternal Aunt      ROS   ROS  Exam   Vitals:   06/16/17 0711  BP: 120/78  Pulse: 71  Resp: (!) 22  Temp: 99.1 F (37.3 C)  SpO2: 97%   General appearance:  WDWN Eyes: PERRL, Fundoscopic: normal Cardiovascular: Regular rate and rhythm without murmurs, rubs, gallops. No edema or variciosities. Distal pulses normal. Pulmonary: Clear to auscultation Musculoskeletal:     Muscle tone upper extremities: Normal    Muscle tone lower extremities: Normal    Motor exam: Upper Extremities Deltoid Bicep Tricep Grip  Right 5/5 5/5 5/5 5/5  Left 5/5 5/5 5/5 5/5   Lower Extremity IP Quad PF DF EHL  Right 5/5 5/5 5/5 5/5 5/5  Left 5/5 5/5 5/5 5/5 5/5   Neurological Awake, alert, oriented Memory and concentration grossly intact Speech fluent, appropriate CNII: Visual fields normal CNIII/IV/VI: EOMI CNV: Facial sensation normal CNVII: Symmetric, normal strength CNVIII: Grossly normal CNIX: Normal palate movement CNXI: Trap and SCM strength normal CN XII: Tongue protrusion normal Sensation grossly intact to LT DTR: Normal Coordination (finger/nose & heel/shin): Normal  Results - Imaging/Labs   Results for orders placed or performed during the hospital encounter of 06/16/17 (from the past 48 hour(s))  APTT     Status: None   Collection Time: 06/16/17  7:30 AM  Result  Value Ref Range   aPTT 30 24 - 36 seconds    Comment: Performed at [facility], [place] [address] [place], [place] [address]  CBC WITH DIFFERENTIAL     Status: Abnormal   Collection Time: 06/16/17  7:30 AM  Result Value Ref Range   WBC 7.7 4.0 - 10.5 K/uL   RBC 4.07 3.87 - 5.11 MIL/uL   Hemoglobin 11.6 (L) 12.0 - 15.0 g/dL   HCT 36.5 36.0 - 46.0 %   MCV 89.7 78.0 - 100.0 fL   MCH 28.5 26.0 - 34.0 pg   MCHC 31.8 30.0 - 36.0 g/dL   RDW 17.9 (H) 11.5 - 15.5 %   Platelets 290 150 - 400 K/uL   Neutrophils Relative % 48 %   Neutro Abs 3.7 1.7 - 7.7 K/uL   Lymphocytes Relative 42 %   Lymphs Abs 3.3 0.7 - 4.0 K/uL   Monocytes Relative 7 %   Monocytes Absolute 0.6 0.1 - 1.0 K/uL   Eosinophils Relative 1 %   Eosinophils Absolute 0.1 0.0 - 0.7 K/uL   Basophils Relative 0 %   Basophils Absolute 0.0 0.0 - 0.1 K/uL   Immature Granulocytes 0 %   Abs Immature Granulocytes 0.0 0.0 - 0.1 K/uL    Comment: Performed at [facility] [address], [place], [place] [address]  Protime-INR     Status: None   Collection Time: 06/16/17  7:30 AM  Result Value Ref Range   Prothrombin Time 13.5 11.4 - 15.2 seconds   INR 1.04     Comment: Performed at [facility] [address], [place], [place] [address]    No results found.   Impression/Plan   - 57 y.o. female with previously stented left paraophthalmic aneurysm Dec 2018. She presents today for f/u diagnostic cerebral angiogram. While in office, risks, benefits and alternatives to procedure were discussed. Patient states in own language understanding and wished to proceed. All questions answered. Consent signed.

## 2017-06-16 NOTE — Sedation Documentation (Signed)
5 french exoseal device deployed.

## 2017-06-17 ENCOUNTER — Encounter (HOSPITAL_COMMUNITY): Payer: Self-pay | Admitting: Emergency Medicine

## 2017-06-17 ENCOUNTER — Other Ambulatory Visit: Payer: Self-pay

## 2017-06-17 ENCOUNTER — Emergency Department (HOSPITAL_COMMUNITY)
Admission: EM | Admit: 2017-06-17 | Discharge: 2017-06-17 | Disposition: A | Discharge status: Home / Self Care | Payer: Medicare HMO | Attending: Emergency Medicine | Admitting: Emergency Medicine

## 2017-06-17 ENCOUNTER — Emergency Department (HOSPITAL_BASED_OUTPATIENT_CLINIC_OR_DEPARTMENT_OTHER)
Admission: RE | Admit: 2017-06-17 | Discharge: 2017-06-17 | Disposition: A | Discharge status: Home / Self Care | Payer: Medicare HMO | Source: Ambulatory Visit | Attending: Emergency Medicine | Admitting: Emergency Medicine

## 2017-06-17 DIAGNOSIS — M79609 Pain in unspecified limb: Secondary | ICD-10-CM

## 2017-06-17 DIAGNOSIS — R7303 Prediabetes: Secondary | ICD-10-CM | POA: Insufficient documentation

## 2017-06-17 DIAGNOSIS — J45909 Unspecified asthma, uncomplicated: Secondary | ICD-10-CM | POA: Insufficient documentation

## 2017-06-17 DIAGNOSIS — F17200 Nicotine dependence, unspecified, uncomplicated: Secondary | ICD-10-CM | POA: Diagnosis not present

## 2017-06-17 DIAGNOSIS — Z7902 Long term (current) use of antithrombotics/antiplatelets: Secondary | ICD-10-CM | POA: Diagnosis not present

## 2017-06-17 DIAGNOSIS — Z79899 Other long term (current) drug therapy: Secondary | ICD-10-CM | POA: Diagnosis not present

## 2017-06-17 DIAGNOSIS — Z7984 Long term (current) use of oral hypoglycemic drugs: Secondary | ICD-10-CM | POA: Insufficient documentation

## 2017-06-17 DIAGNOSIS — G8918 Other acute postprocedural pain: Secondary | ICD-10-CM | POA: Diagnosis not present

## 2017-06-17 DIAGNOSIS — Z9889 Other specified postprocedural states: Secondary | ICD-10-CM | POA: Diagnosis not present

## 2017-06-17 DIAGNOSIS — I10 Essential (primary) hypertension: Secondary | ICD-10-CM | POA: Insufficient documentation

## 2017-06-17 DIAGNOSIS — R1030 Lower abdominal pain, unspecified: Secondary | ICD-10-CM | POA: Diagnosis not present

## 2017-06-17 DIAGNOSIS — Z7982 Long term (current) use of aspirin: Secondary | ICD-10-CM | POA: Insufficient documentation

## 2017-06-17 NOTE — Progress Notes (Addendum)
VASCULAR LAB PRELIMINARY  PRELIMINARY  PRELIMINARY  PRELIMINARY  Right groin ultrasound completed.    Preliminary report:  There is no obvious evidence of pseudoaneurysm or AV fistula noted in the right groin.   Called report to Dr. Baldemar Lenis, [facility], RVT 06/17/2017, 10:29 AM

## 2017-06-17 NOTE — ED Triage Notes (Signed)
Patient reports bleeding at surgical dressing at right groin this evening , no bleeding at triage / dressing changed by RN at triage , pt. stated surgery done yesterday . Ambulatory.

## 2017-06-17 NOTE — Discharge Instructions (Signed)
Take Tylenol as directed for pain.  Call Dr. Dema Severin to arrange to be seen in the office or return if concern for any reason.  Ask Dr. Dema Severin to help you to stop smoking

## 2017-06-17 NOTE — ED Provider Notes (Addendum)
[place] [facility] EMERGENCY DEPARTMENT Provider Note   CSN: [id number] Arrival date & time: 06/17/17  0251     History   Chief Complaint Chief Complaint  Patient presents with  . Post-op Problem    HPI JUBILEE VIVERO is a 57 y.o. female.  HPI's complaints of pain and bleeding of right groin started yesterday after having undergone cerebral angiogram.  No other complaint.  Pain is mild nothing makes symptoms better or worse.  No other associated symptoms no treatment prior to coming she was told to stop Plavix which she has done no other associated symptoms Past Medical History:  Diagnosis Date  . Anxiety   . Arthritis   . Depression   . GERD (gastroesophageal reflux disease)   . Headache   . Hypertension   . MRSA (methicillin resistant Staphylococcus aureus)   . Pre-diabetes     Patient Active Problem List   Diagnosis Date Noted  . Cerebral aneurysm, nonruptured 12/09/2016  . Aneurysm ([place]) 09/29/2016  . 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 Hospital discharge follow-up 10/23/2014  . PICC (peripherally inserted central catheter) removal 10/23/2014  . Sepsis ([place])   . Leukocytosis 10/10/2014  . SIRS (systemic inflammatory response syndrome) ([place]) 10/10/2014  . Impaired glucose tolerance 10/10/2014  . Myalgia   . Essential hypertension 10/21/2013  . Midline low back pain with left-sided sciatica 10/21/2013  . Breast cancer screening 10/21/2013  . Gastroesophageal reflux disease without esophagitis 10/21/2013  . Encounter for screening mammogram for malignant neoplasm of breast 10/21/2013  . Numbness and tingling 05/20/2013  . Shoulder pain, right 02/18/2013  . Morbid obesity ([place]) 02/18/2013  . Physical exam, routine 10/25/2012  . Allergy 10/25/2012  . Hypertension 04/06/2012  . Prediabetes 04/06/2012  . Peptic ulcer 04/06/2012  . INSOMNIA 02/04/2010  . SEBACEOUS CYST, SCALP 08/21/2009  . HYPERLIPIDEMIA 06/02/2009  . HEADACHE 06/02/2009    . DEPRESSION 05/19/2009  . FATIGUE 05/19/2009  . 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 TOBACCO ABUSE 11/24/2006  . PELVIC PAIN, RIGHT 11/24/2006  . ASTHMA 11/10/2005    Past Surgical History:  Procedure Laterality Date  . ABDOMINAL HYSTERECTOMY    . IR ANGIO INTRA EXTRACRAN SEL INTERNAL CAROTID BILAT MOD SED  10/20/2016  . IR ANGIO INTRA EXTRACRAN SEL INTERNAL CAROTID BILAT MOD SED  12/09/2016  . IR ANGIO VERTEBRAL SEL VERTEBRAL BILAT MOD SED  10/20/2016  . IR ANGIOGRAM FOLLOW UP STUDY  12/09/2016  . IR TRANSCATH/EMBOLIZ  12/09/2016  . RADIOLOGY WITH ANESTHESIA N/A 12/09/2016   Procedure: Pipeline embolization of aneurysm and possible coiling;  Surgeon: Consuella Lose, MD;  Location: [facility];  Service: Radiology;  Laterality: N/A;     OB History   None      Home Medications    Prior to Admission medications   Medication Sig Start Date End Date Taking? Authorizing Provider  acetaminophen (TYLENOL) 500 MG tablet Take 2 tablets (1,000 mg total) by mouth every 8 (eight) hours as needed for headache. 01/19/17   Alfonse Spruce, FNP  aspirin 325 MG tablet Take 325 mg by mouth daily.    [provider]  aspirin EC 325 MG tablet Take 325 mg by mouth daily.    [provider]  atorvastatin (LIPITOR) 20 MG tablet Take 1 tablet (20 mg total) by mouth daily. 11/30/16   Alfonse Spruce, FNP  atorvastatin (LIPITOR) 20 MG tablet Take 20 mg by mouth daily.    [provider]  cephALEXin (  KEFLEX) 500 MG capsule Take 1 capsule (500 mg total) by mouth 3 (three) times daily. 05/13/17   Domenic Moras, PA-C  clopidogrel (PLAVIX) 75 MG tablet Take 75 mg by mouth daily.    [provider]  clopidogrel (PLAVIX) 75 MG tablet Take 75 mg by mouth daily.    [provider]  esomeprazole (NEXIUM) 40 MG capsule Take 1 capsule (40 mg total) by mouth daily as needed. 12/15/16   Alfonse Spruce, FNP  esomeprazole (NEXIUM) 40 MG capsule Take 40  mg by mouth daily as needed (acid reflux).    [provider]  gabapentin (NEURONTIN) 100 MG capsule Take 2 capsules (200 mg total) by mouth 3 (three) times daily. 11/02/15   Maren Reamer, MD  gabapentin (NEURONTIN) 300 MG capsule TAKE ONE CAPSULE BY MOUTH THREE TIMES DAILY Patient not taking: Reported on 12/23/2016 12/15/16   Alfonse Spruce, FNP  hydrochlorothiazide (HYDRODIURIL) 25 MG tablet Take 1 tablet (25 mg total) by mouth daily. 04/10/17   Orlie Dakin, MD  hydrochlorothiazide (HYDRODIURIL) 25 MG tablet Take 25 mg by mouth daily.    [provider]  HYDROcodone-acetaminophen (NORCO/VICODIN) 5-325 MG tablet Take 1 tablet by mouth every 4 (four) hours as needed for moderate pain. Patient not taking: Reported on 01/19/2017 12/10/16   Newman Pies, MD  hydrOXYzine (ATARAX/VISTARIL) 25 MG tablet Take 25 mg by mouth daily as needed for anxiety.    [provider]  metFORMIN (GLUCOPHAGE-XR) 500 MG 24 hr tablet TAKE 1 TABLET BY MOUTH ONCE DAILY WITH BREAKFAST 02/01/17   Alfonse Spruce, FNP  metFORMIN (GLUCOPHAGE-XR) 500 MG 24 hr tablet Take 500 mg by mouth daily with breakfast.    [provider]  potassium chloride SA (K-DUR,KLOR-CON) 20 MEQ tablet Take 1 tablet (20 mEq total) by mouth daily. 9/38/18   Delora Fuel, MD  pramipexole (MIRAPEX) 0.125 MG tablet Take 0.125 mg by mouth at bedtime.    [provider]  pramipexole (MIRAPEX) 0.125 MG tablet Take 0.125 mg by mouth at bedtime as needed (restless leg).    [provider]  ranitidine (ZANTAC) 300 MG tablet Take 300 mg by mouth daily. 03/30/17   [provider]  tiZANidine (ZANAFLEX) 2 MG tablet Take 1 tablet (2 mg total) by mouth every 8 (eight) hours as needed for muscle spasms. 09/05/16   Tresa Garter, MD  tiZANidine (ZANAFLEX) 2 MG tablet Take 2 mg by mouth every 6 (six) hours as needed for muscle spasms.    [provider]  traMADol (ULTRAM) 50  MG tablet Take 1 tablet (50 mg total) by mouth every 6 (six) hours as needed for moderate pain. 05/13/17   Domenic Moras, PA-C    Family History Family History  Problem Relation Age of Onset  . Colon cancer Maternal Grandfather 33  . Breast cancer Maternal Aunt     Social History Social History   Tobacco Use  . Smoking status: Current Every Day Smoker    Packs/day: 0.50  . Smokeless tobacco: Never Used  Substance Use Topics  . Alcohol use: Never    Frequency: Never  . Drug use: Never     Allergies   Doxycycline; Doxycycline; and Tape   Review of Systems Review of Systems  Musculoskeletal:       Right groin pain  All other systems reviewed and are negative.    Physical Exam Updated Vital Signs BP 115/69 (BP Location: Left Arm)   Pulse 60   Temp  72 F (36.7 C) (Oral)   Resp 14   SpO2 97%   Physical Exam  Constitutional: She appears well-developed and well-nourished.  HENT:  Head: Normocephalic and atraumatic.  Eyes: Pupils are equal, round, and reactive to light. Conjunctivae are normal.  Neck: Neck supple. No tracheal deviation present. No thyromegaly present.  Cardiovascular: Normal rate and regular rhythm.  No murmur heard. Pulmonary/Chest: Effort normal and breath sounds normal.  Abdominal: Soft. Bowel sounds are normal. She exhibits no distension. There is no tenderness.  Musculoskeletal: Normal range of motion. She exhibits no edema or tenderness.  righLower extremity ecchymotic and tender at inguinal crease.  No appreciable swelling.  DP pulses 2+ bilaterally femoral pulses 2+ bilaterally.  Good capillary refill.  Full range of motion.  There is a puncture site at the inguinal crease.  No active bleeding.  Neurological: She is alert. Coordination normal.  Skin: Skin is warm and dry. No rash noted.  Psychiatric: She has a normal mood and affect.  Nursing note and vitals reviewed.    ED Treatments / Results  Labs (all labs ordered are listed, but only  abnormal results are displayed) Labs Reviewed - No data to display  EKG None  Radiology No results found.  Procedures Procedures (including critical care time)  Medications Ordered in ED Medications - No data to display   Initial Impression / Assessment and Plan / ED Course  I have reviewed the triage vital signs and the nursing notes.  Pertinent labs & imaging results that were available during my care of the patient were reviewed by me and considered in my medical decision making (see chart for details).     Vascular study to rule out pseudoaneurysm ordered Vascular study shows no pseudoaneurysm or AV fistula.  10:45 AM patient alert ambulatory no distress.  Plan Tylenol for pain.  No complication of procedure follow-up with Dr. Dema Severin as needed.  I counseled patient for 5 minutes on smoking cessation Final Clinical Impressions(s) / ED Diagnoses  Diagnosis#1 postoperative pain and bleeding Final diagnoses:  None   #2 tobacco abuse ED Discharge Orders    None       Orlie Dakin, MD 06/17/17 [place], MD 06/17/17 1052

## 2017-06-27 DIAGNOSIS — F4310 Post-traumatic stress disorder, unspecified: Secondary | ICD-10-CM | POA: Diagnosis not present

## 2017-07-07 DIAGNOSIS — M5442 Lumbago with sciatica, left side: Secondary | ICD-10-CM | POA: Diagnosis not present

## 2017-07-07 DIAGNOSIS — I10 Essential (primary) hypertension: Secondary | ICD-10-CM | POA: Diagnosis not present

## 2017-07-07 DIAGNOSIS — L910 Hypertrophic scar: Secondary | ICD-10-CM | POA: Diagnosis not present

## 2017-07-11 ENCOUNTER — Encounter (HOSPITAL_COMMUNITY): Payer: Self-pay | Admitting: Neurosurgery

## 2017-07-20 DIAGNOSIS — F4310 Post-traumatic stress disorder, unspecified: Secondary | ICD-10-CM | POA: Diagnosis not present

## 2017-07-26 ENCOUNTER — Other Ambulatory Visit: Payer: Self-pay | Admitting: Physician Assistant

## 2017-07-26 ENCOUNTER — Ambulatory Visit
Admission: RE | Admit: 2017-07-26 | Discharge: 2017-07-26 | Disposition: A | Discharge status: Home / Self Care | Payer: Medicare HMO | Source: Ambulatory Visit | Attending: Physician Assistant | Admitting: Physician Assistant

## 2017-07-26 DIAGNOSIS — M5416 Radiculopathy, lumbar region: Secondary | ICD-10-CM | POA: Diagnosis not present

## 2017-07-26 DIAGNOSIS — M47816 Spondylosis without myelopathy or radiculopathy, lumbar region: Secondary | ICD-10-CM | POA: Diagnosis not present

## 2017-07-26 DIAGNOSIS — M4186 Other forms of scoliosis, lumbar region: Secondary | ICD-10-CM | POA: Diagnosis not present

## 2017-07-31 DIAGNOSIS — Z6829 Body mass index (BMI) 29.0-29.9, adult: Secondary | ICD-10-CM | POA: Diagnosis not present

## 2017-07-31 DIAGNOSIS — M5442 Lumbago with sciatica, left side: Secondary | ICD-10-CM | POA: Diagnosis not present

## 2017-07-31 DIAGNOSIS — I10 Essential (primary) hypertension: Secondary | ICD-10-CM | POA: Diagnosis not present

## 2017-08-08 DIAGNOSIS — M25552 Pain in left hip: Secondary | ICD-10-CM | POA: Diagnosis not present

## 2017-08-14 ENCOUNTER — Other Ambulatory Visit: Payer: Self-pay | Admitting: Orthopedic Surgery

## 2017-08-14 DIAGNOSIS — M25552 Pain in left hip: Secondary | ICD-10-CM

## 2017-08-16 ENCOUNTER — Ambulatory Visit
Admission: RE | Admit: 2017-08-16 | Discharge: 2017-08-16 | Disposition: A | Discharge status: Home / Self Care | Payer: Medicare HMO | Source: Ambulatory Visit | Attending: Orthopedic Surgery | Admitting: Orthopedic Surgery

## 2017-08-16 DIAGNOSIS — M25552 Pain in left hip: Secondary | ICD-10-CM

## 2017-08-21 ENCOUNTER — Other Ambulatory Visit: Payer: Self-pay | Admitting: Orthopedic Surgery

## 2017-08-21 ENCOUNTER — Ambulatory Visit: Payer: Medicare HMO | Admitting: Physical Therapy

## 2017-08-21 DIAGNOSIS — M545 Low back pain, unspecified: Secondary | ICD-10-CM

## 2017-08-21 DIAGNOSIS — M25552 Pain in left hip: Secondary | ICD-10-CM | POA: Diagnosis not present

## 2017-08-25 DIAGNOSIS — F331 Major depressive disorder, recurrent, moderate: Secondary | ICD-10-CM | POA: Diagnosis not present

## 2017-08-27 ENCOUNTER — Ambulatory Visit
Admission: RE | Admit: 2017-08-27 | Discharge: 2017-08-27 | Disposition: A | Discharge status: Home / Self Care | Payer: Medicare HMO | Source: Ambulatory Visit | Attending: Orthopedic Surgery | Admitting: Orthopedic Surgery

## 2017-08-27 DIAGNOSIS — M545 Low back pain, unspecified: Secondary | ICD-10-CM

## 2017-08-31 ENCOUNTER — Other Ambulatory Visit: Payer: Medicare HMO

## 2017-08-31 DIAGNOSIS — M25552 Pain in left hip: Secondary | ICD-10-CM | POA: Diagnosis not present

## 2017-09-21 DIAGNOSIS — F331 Major depressive disorder, recurrent, moderate: Secondary | ICD-10-CM | POA: Diagnosis not present

## 2017-09-21 DIAGNOSIS — M5416 Radiculopathy, lumbar region: Secondary | ICD-10-CM | POA: Diagnosis not present

## 2017-09-21 DIAGNOSIS — M5126 Other intervertebral disc displacement, lumbar region: Secondary | ICD-10-CM | POA: Diagnosis not present

## 2017-10-05 DIAGNOSIS — M5416 Radiculopathy, lumbar region: Secondary | ICD-10-CM | POA: Diagnosis not present

## 2017-10-05 DIAGNOSIS — M5126 Other intervertebral disc displacement, lumbar region: Secondary | ICD-10-CM | POA: Diagnosis not present

## 2017-10-16 DIAGNOSIS — K219 Gastro-esophageal reflux disease without esophagitis: Secondary | ICD-10-CM | POA: Diagnosis not present

## 2017-10-16 DIAGNOSIS — Z23 Encounter for immunization: Secondary | ICD-10-CM | POA: Diagnosis not present

## 2017-10-16 DIAGNOSIS — M5136 Other intervertebral disc degeneration, lumbar region: Secondary | ICD-10-CM | POA: Diagnosis not present

## 2017-10-16 DIAGNOSIS — E785 Hyperlipidemia, unspecified: Secondary | ICD-10-CM | POA: Diagnosis not present

## 2017-10-16 DIAGNOSIS — F17200 Nicotine dependence, unspecified, uncomplicated: Secondary | ICD-10-CM | POA: Diagnosis not present

## 2017-10-16 DIAGNOSIS — R7303 Prediabetes: Secondary | ICD-10-CM | POA: Diagnosis not present

## 2017-10-16 DIAGNOSIS — I10 Essential (primary) hypertension: Secondary | ICD-10-CM | POA: Diagnosis not present

## 2017-10-16 DIAGNOSIS — G2581 Restless legs syndrome: Secondary | ICD-10-CM | POA: Diagnosis not present

## 2017-10-26 DIAGNOSIS — M5416 Radiculopathy, lumbar region: Secondary | ICD-10-CM | POA: Diagnosis not present

## 2017-10-26 DIAGNOSIS — M5126 Other intervertebral disc displacement, lumbar region: Secondary | ICD-10-CM | POA: Diagnosis not present

## 2017-11-01 ENCOUNTER — Other Ambulatory Visit: Payer: Self-pay | Admitting: Neurosurgery

## 2017-11-01 DIAGNOSIS — I671 Cerebral aneurysm, nonruptured: Secondary | ICD-10-CM

## 2017-11-10 DIAGNOSIS — M5126 Other intervertebral disc displacement, lumbar region: Secondary | ICD-10-CM | POA: Diagnosis not present

## 2017-11-10 DIAGNOSIS — M5416 Radiculopathy, lumbar region: Secondary | ICD-10-CM | POA: Diagnosis not present

## 2017-11-22 DIAGNOSIS — H5203 Hypermetropia, bilateral: Secondary | ICD-10-CM | POA: Diagnosis not present

## 2017-11-22 DIAGNOSIS — Q150 Congenital glaucoma: Secondary | ICD-10-CM | POA: Diagnosis not present

## 2017-11-22 DIAGNOSIS — I10 Essential (primary) hypertension: Secondary | ICD-10-CM | POA: Diagnosis not present

## 2017-11-29 ENCOUNTER — Other Ambulatory Visit: Payer: Self-pay | Admitting: Neurosurgery

## 2017-12-05 DIAGNOSIS — M5126 Other intervertebral disc displacement, lumbar region: Secondary | ICD-10-CM | POA: Diagnosis not present

## 2017-12-05 DIAGNOSIS — M5416 Radiculopathy, lumbar region: Secondary | ICD-10-CM | POA: Diagnosis not present

## 2017-12-12 ENCOUNTER — Other Ambulatory Visit: Payer: Self-pay | Admitting: Neurosurgery

## 2017-12-12 ENCOUNTER — Other Ambulatory Visit (HOSPITAL_COMMUNITY): Payer: Self-pay | Admitting: Neurosurgery

## 2017-12-12 ENCOUNTER — Ambulatory Visit (HOSPITAL_COMMUNITY)
Admission: RE | Admit: 2017-12-12 | Discharge: 2017-12-12 | Disposition: A | Discharge status: Home / Self Care | Payer: Medicare HMO | Source: Ambulatory Visit | Attending: Neurosurgery | Admitting: Neurosurgery

## 2017-12-12 ENCOUNTER — Other Ambulatory Visit: Payer: Self-pay

## 2017-12-12 ENCOUNTER — Encounter (HOSPITAL_COMMUNITY): Payer: Self-pay | Admitting: Radiology

## 2017-12-12 DIAGNOSIS — F329 Major depressive disorder, single episode, unspecified: Secondary | ICD-10-CM | POA: Insufficient documentation

## 2017-12-12 DIAGNOSIS — I671 Cerebral aneurysm, nonruptured: Secondary | ICD-10-CM | POA: Diagnosis not present

## 2017-12-12 DIAGNOSIS — M1990 Unspecified osteoarthritis, unspecified site: Secondary | ICD-10-CM | POA: Diagnosis not present

## 2017-12-12 DIAGNOSIS — Z7984 Long term (current) use of oral hypoglycemic drugs: Secondary | ICD-10-CM | POA: Diagnosis not present

## 2017-12-12 DIAGNOSIS — Z8614 Personal history of Methicillin resistant Staphylococcus aureus infection: Secondary | ICD-10-CM | POA: Insufficient documentation

## 2017-12-12 DIAGNOSIS — I10 Essential (primary) hypertension: Secondary | ICD-10-CM | POA: Diagnosis not present

## 2017-12-12 DIAGNOSIS — Z09 Encounter for follow-up examination after completed treatment for conditions other than malignant neoplasm: Secondary | ICD-10-CM | POA: Insufficient documentation

## 2017-12-12 DIAGNOSIS — F17210 Nicotine dependence, cigarettes, uncomplicated: Secondary | ICD-10-CM | POA: Insufficient documentation

## 2017-12-12 DIAGNOSIS — R7303 Prediabetes: Secondary | ICD-10-CM | POA: Diagnosis not present

## 2017-12-12 DIAGNOSIS — Z6831 Body mass index (BMI) 31.0-31.9, adult: Secondary | ICD-10-CM | POA: Diagnosis not present

## 2017-12-12 DIAGNOSIS — Z7902 Long term (current) use of antithrombotics/antiplatelets: Secondary | ICD-10-CM | POA: Insufficient documentation

## 2017-12-12 DIAGNOSIS — Z7982 Long term (current) use of aspirin: Secondary | ICD-10-CM | POA: Insufficient documentation

## 2017-12-12 DIAGNOSIS — M545 Low back pain: Secondary | ICD-10-CM | POA: Insufficient documentation

## 2017-12-12 DIAGNOSIS — M791 Myalgia, unspecified site: Secondary | ICD-10-CM | POA: Diagnosis not present

## 2017-12-12 DIAGNOSIS — Z8774 Personal history of (corrected) congenital malformations of heart and circulatory system: Secondary | ICD-10-CM | POA: Diagnosis not present

## 2017-12-12 DIAGNOSIS — Z789 Other specified health status: Secondary | ICD-10-CM

## 2017-12-12 DIAGNOSIS — I872 Venous insufficiency (chronic) (peripheral): Secondary | ICD-10-CM | POA: Diagnosis not present

## 2017-12-12 DIAGNOSIS — N3946 Mixed incontinence: Secondary | ICD-10-CM | POA: Diagnosis not present

## 2017-12-12 DIAGNOSIS — F419 Anxiety disorder, unspecified: Secondary | ICD-10-CM | POA: Insufficient documentation

## 2017-12-12 DIAGNOSIS — K219 Gastro-esophageal reflux disease without esophagitis: Secondary | ICD-10-CM | POA: Insufficient documentation

## 2017-12-12 DIAGNOSIS — Z8679 Personal history of other diseases of the circulatory system: Secondary | ICD-10-CM | POA: Diagnosis not present

## 2017-12-12 HISTORY — PX: IR ANGIO INTRA EXTRACRAN SEL COM CAROTID INNOMINATE UNI R MOD SED: IMG5359

## 2017-12-12 HISTORY — PX: IR ANGIO VERTEBRAL SEL VERTEBRAL UNI R MOD SED: IMG5368

## 2017-12-12 HISTORY — PX: IR US GUIDE VASC ACCESS RIGHT: IMG2390

## 2017-12-12 HISTORY — PX: IR RADIOLOGY PERIPHERAL GUIDED IV START: IMG5598

## 2017-12-12 HISTORY — PX: IR ANGIO INTRA EXTRACRAN SEL INTERNAL CAROTID UNI L MOD SED: IMG5361

## 2017-12-12 LAB — CBC WITH DIFFERENTIAL/PLATELET
ABS IMMATURE GRANULOCYTES: 0.02 10*3/uL (ref 0.00–0.07)
Basophils Absolute: 0 10*3/uL (ref 0.0–0.1)
Basophils Relative: 0 %
Eosinophils Absolute: 0.1 10*3/uL (ref 0.0–0.5)
Eosinophils Relative: 1 %
HCT: 39.4 % (ref 36.0–46.0)
Hemoglobin: 12.1 g/dL (ref 12.0–15.0)
Immature Granulocytes: 0 %
LYMPHS PCT: 48 %
Lymphs Abs: 3.2 10*3/uL (ref 0.7–4.0)
MCH: 28 pg (ref 26.0–34.0)
MCHC: 30.7 g/dL (ref 30.0–36.0)
MCV: 91.2 fL (ref 80.0–100.0)
Monocytes Absolute: 0.5 10*3/uL (ref 0.1–1.0)
Monocytes Relative: 8 %
NEUTROS ABS: 2.8 10*3/uL (ref 1.7–7.7)
Neutrophils Relative %: 43 %
Platelets: 280 10*3/uL (ref 150–400)
RBC: 4.32 MIL/uL (ref 3.87–5.11)
RDW: 15.9 % — ABNORMAL HIGH (ref 11.5–15.5)
WBC: 6.6 10*3/uL (ref 4.0–10.5)
nRBC: 0 % (ref 0.0–0.2)

## 2017-12-12 LAB — BASIC METABOLIC PANEL
Anion gap: 8 (ref 5–15)
BUN: 9 mg/dL (ref 6–20)
CO2: 23 mmol/L (ref 22–32)
Calcium: 9.1 mg/dL (ref 8.9–10.3)
Chloride: 109 mmol/L (ref 98–111)
Creatinine, Ser: 1.12 mg/dL — ABNORMAL HIGH (ref 0.44–1.00)
GFR calc Af Amer: >60 mL/min (ref >60)
GFR calc non Af Amer: 55 mL/min — ABNORMAL LOW (ref >60)
Glucose, Bld: 88 mg/dL (ref 70–99)
Potassium: 3.8 mmol/L (ref 3.5–5.1)
Sodium: 140 mmol/L (ref 135–145)

## 2017-12-12 LAB — APTT: aPTT: 29 seconds (ref 24–36)

## 2017-12-12 LAB — PROTIME-INR
INR: 0.97
Prothrombin Time: 12.8 seconds (ref 11.4–15.2)

## 2017-12-12 MED ORDER — MIDAZOLAM HCL 2 MG/2ML IJ SOLN
INTRAMUSCULAR | Status: AC | PRN
  Administered 2017-12-12: 1 mg via INTRAVENOUS

## 2017-12-12 MED ORDER — IOPAMIDOL (ISOVUE-300) INJECTION 61%
INTRAVENOUS | Status: AC
  Filled 2017-12-12: qty 100

## 2017-12-12 MED ORDER — HEPARIN SODIUM (PORCINE) 1000 UNIT/ML IJ SOLN
INTRAMUSCULAR | Status: AC | PRN
  Administered 2017-12-12: 3000 [IU] via INTRAVENOUS

## 2017-12-12 MED ORDER — FENTANYL CITRATE (PF) 100 MCG/2ML IJ SOLN
INTRAMUSCULAR | Status: AC | PRN
  Administered 2017-12-12: 25 ug via INTRAVENOUS

## 2017-12-12 MED ORDER — LIDOCAINE HCL (PF) 1 % IJ SOLN
INTRAMUSCULAR | Status: AC | PRN
  Administered 2017-12-12: 10 mL

## 2017-12-12 MED ORDER — ACETAMINOPHEN 325 MG PO TABS
650.0000 mg | ORAL_TABLET | Freq: Four times a day (QID) | ORAL | Status: DC | PRN
  Administered 2017-12-12: 650 mg via ORAL
  Filled 2017-12-12 (×3): qty 2

## 2017-12-12 MED ORDER — NITROGLYCERIN 1 MG/10 ML FOR IR/CATH LAB
INTRA_ARTERIAL | Status: AC | PRN
  Administered 2017-12-12: 100 ug via INTRA_ARTERIAL

## 2017-12-12 MED ORDER — FENTANYL CITRATE (PF) 100 MCG/2ML IJ SOLN
INTRAMUSCULAR | Status: AC
  Filled 2017-12-12: qty 2

## 2017-12-12 MED ORDER — VERAPAMIL HCL 2.5 MG/ML IV SOLN
INTRAVENOUS | Status: AC | PRN
  Administered 2017-12-12: 2.5 mg via INTRA_ARTERIAL

## 2017-12-12 MED ORDER — LIDOCAINE HCL 1 % IJ SOLN
INTRAMUSCULAR | Status: AC
  Filled 2017-12-12: qty 20

## 2017-12-12 MED ORDER — HYDRALAZINE HCL 20 MG/ML IJ SOLN
INTRAMUSCULAR | Status: AC
  Filled 2017-12-12: qty 1

## 2017-12-12 MED ORDER — HYDRALAZINE HCL 20 MG/ML IJ SOLN
INTRAMUSCULAR | Status: AC | PRN
  Administered 2017-12-12: 10 mg via INTRAVENOUS

## 2017-12-12 MED ORDER — MIDAZOLAM HCL 2 MG/2ML IJ SOLN
INTRAMUSCULAR | Status: AC
  Filled 2017-12-12: qty 2

## 2017-12-12 MED ORDER — HEPARIN SODIUM (PORCINE) 1000 UNIT/ML IJ SOLN
INTRAMUSCULAR | Status: AC
  Filled 2017-12-12: qty 1

## 2017-12-12 MED ORDER — HEPARIN SOD (PORK) LOCK FLUSH 100 UNIT/ML IV SOLN: 500.0000 [IU] | INTRAVENOUS | Status: DC | PRN

## 2017-12-12 MED ORDER — CEFAZOLIN SODIUM-DEXTROSE 2-4 GM/100ML-% IV SOLN: 2.0000 g | INTRAVENOUS | Status: DC

## 2017-12-12 MED ORDER — CHLORHEXIDINE GLUCONATE CLOTH 2 % EX PADS: 6.0000 | MEDICATED_PAD | Freq: Once | CUTANEOUS | Status: DC

## 2017-12-12 MED ORDER — LIDOCAINE-PRILOCAINE 2.5-2.5 % EX CREA
TOPICAL_CREAM | Freq: Once | CUTANEOUS | Status: AC
  Administered 2017-12-12: 11:00:00 via TOPICAL
  Filled 2017-12-12: qty 5

## 2017-12-12 MED ORDER — IOPAMIDOL (ISOVUE-300) INJECTION 61%
INTRAVENOUS | Status: AC
  Administered 2017-12-12: 100 mL
  Filled 2017-12-12: qty 50

## 2017-12-12 MED ORDER — VERAPAMIL HCL 2.5 MG/ML IV SOLN
INTRAVENOUS | Status: AC
  Filled 2017-12-12: qty 2

## 2017-12-12 MED ORDER — NITROGLYCERIN 1 MG/10 ML FOR IR/CATH LAB
INTRA_ARTERIAL | Status: AC
  Filled 2017-12-12: qty 10

## 2017-12-12 NOTE — Sedation Documentation (Signed)
Right radial sheath removed. T-Band applied, balloon at 14

## 2017-12-12 NOTE — Progress Notes (Signed)
Pt complains of really bad pain in her (R) shoulder. Nothing visablly different CMS WNL in (R) arm and extremities.Dr Kathyrn Sheriff notified and to come assess pt

## 2017-12-12 NOTE — Progress Notes (Signed)
Pt complained of hurting all over from" being on that hard table in xr"  And a headache.  Dr Kathyrn Sheriff notified.  Order received for tylenol which was given

## 2017-12-12 NOTE — H&P (Signed)
Chief Complaint   Aneurysm  HPI   HPI: Crystal Stafford is a 57 y.o. 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   No history of hemorrhage from aneurysm. She presents today for routine diagnostic angiogram for monitoring.  She is without any concerns.  Patient Active Problem List   Diagnosis Date Noted  . Cerebral aneurysm, nonruptured 12/09/2016  . Aneurysm ([place]) 09/29/2016  . 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 Hospital discharge follow-up 10/23/2014  . PICC (peripherally inserted central catheter) removal 10/23/2014  . Sepsis ([place])   . Leukocytosis 10/10/2014  . SIRS (systemic inflammatory response syndrome) ([place]) 10/10/2014  . Impaired glucose tolerance 10/10/2014  . Myalgia   . Essential hypertension 10/21/2013  . Midline low back pain with left-sided sciatica 10/21/2013  . Breast cancer screening 10/21/2013  . Gastroesophageal reflux disease without esophagitis 10/21/2013  . Encounter for screening mammogram for malignant neoplasm of breast 10/21/2013  . Numbness and tingling 05/20/2013  . Shoulder pain, right 02/18/2013  . Morbid obesity ([place]) 02/18/2013  . Physical exam, routine 10/25/2012  . Allergy 10/25/2012  . Hypertension 04/06/2012  . Prediabetes 04/06/2012  . Peptic ulcer 04/06/2012  . INSOMNIA 02/04/2010  . SEBACEOUS CYST, SCALP 08/21/2009  . HYPERLIPIDEMIA 06/02/2009  . HEADACHE 06/02/2009  . DEPRESSION 05/19/2009  . FATIGUE 05/19/2009  . 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 TOBACCO ABUSE 11/24/2006  . PELVIC PAIN, RIGHT 11/24/2006  . ASTHMA 11/10/2005    PMH: Past Medical History:  Diagnosis Date  . Anxiety   . Arthritis   . Depression   . GERD (gastroesophageal reflux disease)   . Headache   . Hypertension   . MRSA (methicillin resistant Staphylococcus aureus)   . Pre-diabetes     PSH: Past Surgical History:  Procedure  Laterality Date  . ABDOMINAL HYSTERECTOMY    . IR ANGIO INTRA EXTRACRAN SEL INTERNAL CAROTID BILAT MOD SED  10/20/2016  . IR ANGIO INTRA EXTRACRAN SEL INTERNAL CAROTID BILAT MOD SED  12/09/2016  . IR ANGIO INTRA EXTRACRAN SEL INTERNAL CAROTID BILAT MOD SED  06/16/2017  . IR ANGIO VERTEBRAL SEL VERTEBRAL BILAT MOD SED  10/20/2016  . IR ANGIO VERTEBRAL SEL VERTEBRAL BILAT MOD SED  06/16/2017  . IR ANGIOGRAM FOLLOW UP STUDY  12/09/2016  . IR TRANSCATH/EMBOLIZ  12/09/2016  . IR US GUIDE VASC ACCESS RIGHT  06/16/2017  . RADIOLOGY WITH ANESTHESIA N/A 12/09/2016   Procedure: Pipeline embolization of aneurysm and possible coiling;  Surgeon: Consuella Lose, MD;  Location: [facility];  Service: Radiology;  Laterality: N/A;     (Not in a hospital admission)  SH: Social History   Tobacco Use  . Smoking status: Current Every Day Smoker    Packs/day: 0.50  . Smokeless tobacco: Never Used  Substance Use Topics  . Alcohol use: Never    Frequency: Never  . Drug use: Never    MEDS: Prior to Admission medications   Medication Sig Start Date End Date Taking? Authorizing Provider  acetaminophen (TYLENOL) 500 MG tablet Take 2 tablets (1,000 mg total) by mouth every 8 (eight) hours as needed for headache. 01/19/17   Alfonse Spruce, FNP  aspirin 325 MG tablet Take 325 mg by mouth daily.    [provider]  aspirin EC 325 MG tablet Take 325 mg by mouth daily.    [provider]  atorvastatin (LIPITOR) 20 MG tablet Take  1 tablet (20 mg total) by mouth daily. 11/30/16   Alfonse Spruce, FNP  atorvastatin (LIPITOR) 20 MG tablet Take 20 mg by mouth daily.    [provider]  cephALEXin (KEFLEX) 500 MG capsule Take 1 capsule (500 mg total) by mouth 3 (three) times daily. 05/13/17   Domenic Moras, PA-C  clopidogrel (PLAVIX) 75 MG tablet Take 75 mg by mouth daily.    [provider]  clopidogrel (PLAVIX) 75 MG tablet Take 75 mg by mouth daily.    [provider]    esomeprazole (NEXIUM) 40 MG capsule Take 1 capsule (40 mg total) by mouth daily as needed. 12/15/16   Alfonse Spruce, FNP  esomeprazole (NEXIUM) 40 MG capsule Take 40 mg by mouth daily as needed (acid reflux).    [provider]  gabapentin (NEURONTIN) 100 MG capsule Take 2 capsules (200 mg total) by mouth 3 (three) times daily. 11/02/15   Maren Reamer, MD  gabapentin (NEURONTIN) 300 MG capsule TAKE ONE CAPSULE BY MOUTH THREE TIMES DAILY Patient not taking: Reported on 12/23/2016 12/15/16   Alfonse Spruce, FNP  hydrochlorothiazide (HYDRODIURIL) 25 MG tablet Take 1 tablet (25 mg total) by mouth daily. 04/10/17   Orlie Dakin, MD  hydrochlorothiazide (HYDRODIURIL) 25 MG tablet Take 25 mg by mouth daily.    [provider]  HYDROcodone-acetaminophen (NORCO/VICODIN) 5-325 MG tablet Take 1 tablet by mouth every 4 (four) hours as needed for moderate pain. Patient not taking: Reported on 01/19/2017 12/10/16   Newman Pies, MD  hydrOXYzine (ATARAX/VISTARIL) 25 MG tablet Take 25 mg by mouth daily as needed for anxiety.    [provider]  metFORMIN (GLUCOPHAGE-XR) 500 MG 24 hr tablet TAKE 1 TABLET BY MOUTH ONCE DAILY WITH BREAKFAST 02/01/17   Alfonse Spruce, FNP  metFORMIN (GLUCOPHAGE-XR) 500 MG 24 hr tablet Take 500 mg by mouth daily with breakfast.    [provider]  potassium chloride SA (K-DUR,KLOR-CON) 20 MEQ tablet Take 1 tablet (20 mEq total) by mouth daily. 4/00/86   Delora Fuel, MD  pramipexole (MIRAPEX) 0.125 MG tablet Take 0.125 mg by mouth at bedtime.    [provider]  pramipexole (MIRAPEX) 0.125 MG tablet Take 0.125 mg by mouth at bedtime as needed (restless leg).    [provider]  ranitidine (ZANTAC) 300 MG tablet Take 300 mg by mouth daily. 03/30/17   [provider]  tiZANidine (ZANAFLEX) 2 MG tablet Take 1 tablet (2 mg total) by mouth every 8 (eight) hours as needed for muscle spasms. 09/05/16    Tresa Garter, MD  tiZANidine (ZANAFLEX) 2 MG tablet Take 2 mg by mouth every 6 (six) hours as needed for muscle spasms.    [provider]  traMADol (ULTRAM) 50 MG tablet Take 1 tablet (50 mg total) by mouth every 6 (six) hours as needed for moderate pain. 05/13/17   Domenic Moras, PA-C    ALLERGY: Allergies  Allergen Reactions  . Doxycycline Shortness Of Breath, Swelling and Rash    Face swelling  . Doxycycline Rash  . Tape Itching and Rash    Social History   Tobacco Use  . Smoking status: Current Every Day Smoker    Packs/day: 0.50  . Smokeless tobacco: Never Used  Substance Use Topics  . Alcohol use: Never    Frequency: Never     Family History  Problem Relation Age of Onset  . Colon cancer Maternal Grandfather 59  . Breast cancer Maternal Aunt  ROS   ROS  Exam   Vitals:   12/12/17 0630  BP: (!) 155/96  Pulse: 84  Resp: 18  Temp: 98.6 F (37 C)  SpO2: 99%   General appearance: WDWN, NAD Eyes: No scleral injection Cardiovascular: Regular rate and rhythm without murmurs, rubs, gallops. No edema or variciosities. Distal pulses normal. Pulmonary: Effort normal, non-labored breathing Musculoskeletal:     Muscle tone upper extremities: Normal    Muscle tone lower extremities: Normal    Motor exam: Upper Extremities Deltoid Bicep Tricep Grip  Right 5/5 5/5 5/5 5/5  Left 5/5 5/5 5/5 5/5   Lower Extremity IP Quad PF DF EHL  Right 5/5 5/5 5/5 5/5 5/5  Left 5/5 5/5 5/5 5/5 5/5   Neurological Mental Status:    - Patient is awake, alert, oriented to person, place, month, year, and situation    - Patient is able to give a clear and coherent history.    - No signs of aphasia or neglect Cranial Nerves    - II: Visual Fields are full. PERRL    - III/IV/VI: EOMI without ptosis or diploplia.     - V: Facial sensation is grossly normal    - VII: Facial movement is symmetric.     - VIII: hearing is intact to voice    - X: Uvula elevates  symmetrically    - XI: Shoulder shrug is symmetric.    - XII: tongue is midline without atrophy or fasciculations.  Sensory: Sensation grossly intact to LT Deep Tendon Reflexes    - 2+ and symmetric in the biceps and patellae.  Plantars   - Toes are downgoing bilaterally.  Cerebellar    - FNF and HKS are intact bilaterally   Results - Imaging/Labs   No results found for this or any previous visit (from the past 48 hour(s)).  No results found.  IMAGING: Diagnostic cerebral angiogram dated 06/16/2017 was again reviewed.  This again demonstrates essentially complete occlusion of the large previously seen left ophthalmic aneurysm.  There is an endo leak around the proximal end of the stent in the proximal portion of the horizontal cavernous segment.  There is no discrete aneurysm.  Impression/Plan   57 y.o. 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  She presents for routine monitoring and angiogram.  While in the office risks, benefits alternatives to the procedure were discussed.  Patient stated an own language understanding and wished to proceed.

## 2017-12-12 NOTE — Discharge Instructions (Addendum)
NO METFORMIN/GLUCOPHAGE FOR 2 DAYS ° ° ° °Radial Site Care °Refer to this sheet in the next few weeks. These instructions provide you with information about caring for yourself after your procedure. Your health care provider may also give you more specific instructions. Your treatment has been planned according to current medical practices, but problems sometimes occur. Call your health care provider if you have any problems or questions after your procedure. °What can I expect after the procedure? °After your procedure, it is typical to have the following: °· Bruising at the radial site that usually fades within 1-2 weeks. °· Blood collecting in the tissue (hematoma) that may be painful to the touch. It should usually decrease in size and tenderness within 1-2 weeks. ° °Follow these instructions at home: °· Take medicines only as directed by your health care provider. °· You may shower 24-48 hours after the procedure or as directed by your health care provider. Remove the bandage (dressing) and gently wash the site with plain soap and water. Pat the area dry with a clean towel. Do not rub the site, because this may cause bleeding. °· Do not take baths, swim, or use a hot tub until your health care provider approves. °· Check your insertion site every day for redness, swelling, or drainage. °· Do not apply powder or lotion to the site. °· Do not flex or bend the affected arm for 24 hours or as directed by your health care provider. °· Do not push or pull heavy objects with the affected arm for 24 hours or as directed by your health care provider. °· Do not lift over 10 lb (4.5 kg) for 5 days after your procedure or as directed by your health care provider. °· Ask your health care provider when it is okay to: °? Return to work or school. °? Resume usual physical activities or sports. °? Resume sexual activity. °· Do not drive home if you are discharged the same day as the procedure. Have someone else drive  you. °· You may drive 24 hours after the procedure unless otherwise instructed by your health care provider. °· Do not operate machinery or power tools for 24 hours after the procedure. °· If your procedure was done as an outpatient procedure, which means that you went home the same day as your procedure, a responsible adult should be with you for the first 24 hours after you arrive home. °· Keep all follow-up visits as directed by your health care provider. This is important. °Contact a health care provider if: °· You have a fever. °· You have chills. °· You have increased bleeding from the radial site. Hold pressure on the site. °Get help right away if: °· You have unusual pain at the radial site. °· You have redness, warmth, or swelling at the radial site. °· You have drainage (other than a small amount of blood on the dressing) from the radial site. °· The radial site is bleeding, and the bleeding does not stop after 30 minutes of holding steady pressure on the site. °· Your arm or hand becomes pale, cool, tingly, or numb. °This information is not intended to replace advice given to you by your health care provider. Make sure you discuss any questions you have with your health care provider. °Document Released: 01/29/2010 Document Revised: 06/04/2015 Document Reviewed: 07/15/2013 °Elsevier Interactive Patient Education © 2018 [employer] °Moderate Conscious Sedation, Adult, Care After °These instructions provide you with information about caring for yourself after your   procedure. Your health care provider may also give you more specific instructions. Your treatment has been planned according to current medical practices, but problems sometimes occur. Call your health care provider if you have any problems or questions after your procedure. °What can I expect after the procedure? °After your procedure, it is common: °· To feel sleepy for several hours. °· To feel clumsy and have poor balance for several  hours. °· To have poor judgment for several hours. °· To vomit if you eat too soon. ° °Follow these instructions at home: °For at least 24 hours after the procedure: ° °· Do not: °? Participate in activities where you could fall or become injured. °? Drive. °? Use heavy machinery. °? Drink alcohol. °? Take sleeping pills or medicines that cause drowsiness. °? Make important decisions or sign legal documents. °? Take care of children on your own. °· Rest. °Eating and drinking °· Follow the diet recommended by your health care provider. °· If you vomit: °? Drink water, juice, or soup when you can drink without vomiting. °? Make sure you have little or no nausea before eating solid foods. °General instructions °· Have a responsible adult stay with you until you are awake and alert. °· Take over-the-counter and prescription medicines only as told by your health care provider. °· If you smoke, do not smoke without supervision. °· Keep all follow-up visits as told by your health care provider. This is important. °Contact a health care provider if: °· You keep feeling nauseous or you keep vomiting. °· You feel light-headed. °· You develop a rash. °· You have a fever. °Get help right away if: °· You have trouble breathing. °This information is not intended to replace advice given to you by your health care provider. Make sure you discuss any questions you have with your health care provider. °Document Released: 10/17/2012 Document Revised: 06/01/2015 Document Reviewed: 04/18/2015 °Elsevier Interactive Patient Education © 2018 [employer] ° °

## 2017-12-14 ENCOUNTER — Other Ambulatory Visit (HOSPITAL_COMMUNITY): Payer: Self-pay | Admitting: Neurosurgery

## 2017-12-14 ENCOUNTER — Encounter (HOSPITAL_COMMUNITY): Payer: Self-pay

## 2017-12-14 DIAGNOSIS — Z789 Other specified health status: Secondary | ICD-10-CM

## 2017-12-19 ENCOUNTER — Encounter (HOSPITAL_COMMUNITY): Payer: Self-pay | Admitting: Neurosurgery

## 2017-12-29 DIAGNOSIS — M5416 Radiculopathy, lumbar region: Secondary | ICD-10-CM | POA: Diagnosis not present

## 2017-12-29 DIAGNOSIS — M5126 Other intervertebral disc displacement, lumbar region: Secondary | ICD-10-CM | POA: Diagnosis not present

## 2018-01-22 DIAGNOSIS — I10 Essential (primary) hypertension: Secondary | ICD-10-CM | POA: Diagnosis not present

## 2018-01-22 DIAGNOSIS — K219 Gastro-esophageal reflux disease without esophagitis: Secondary | ICD-10-CM | POA: Diagnosis not present

## 2018-01-22 DIAGNOSIS — T2121XA Burn of second degree of chest wall, initial encounter: Secondary | ICD-10-CM | POA: Diagnosis not present

## 2018-01-22 DIAGNOSIS — Z7984 Long term (current) use of oral hypoglycemic drugs: Secondary | ICD-10-CM | POA: Diagnosis not present

## 2018-01-22 DIAGNOSIS — E785 Hyperlipidemia, unspecified: Secondary | ICD-10-CM | POA: Diagnosis not present

## 2018-01-22 DIAGNOSIS — R1310 Dysphagia, unspecified: Secondary | ICD-10-CM | POA: Diagnosis not present

## 2018-01-22 DIAGNOSIS — E1169 Type 2 diabetes mellitus with other specified complication: Secondary | ICD-10-CM | POA: Diagnosis not present

## 2018-01-22 DIAGNOSIS — R1010 Upper abdominal pain, unspecified: Secondary | ICD-10-CM | POA: Diagnosis not present

## 2018-01-30 DIAGNOSIS — M5416 Radiculopathy, lumbar region: Secondary | ICD-10-CM | POA: Diagnosis not present

## 2018-01-30 DIAGNOSIS — M5126 Other intervertebral disc displacement, lumbar region: Secondary | ICD-10-CM | POA: Diagnosis not present

## 2018-02-07 ENCOUNTER — Encounter: Payer: Medicare HMO | Attending: Family Medicine | Admitting: Registered"

## 2018-02-07 ENCOUNTER — Encounter: Payer: Self-pay | Admitting: Registered"

## 2018-02-07 DIAGNOSIS — E119 Type 2 diabetes mellitus without complications: Secondary | ICD-10-CM | POA: Insufficient documentation

## 2018-02-07 NOTE — Progress Notes (Signed)
Diabetes Self-Management Education  Visit Type:  First/Initial  Appt. Start Time: 8:00 Appt. End Time: 9:20  02/07/2018  [name], identified by name and date of birth, is a 58 y.o. female with a diagnosis of Diabetes: Type 2.   ASSESSMENT  Pt expectations: wants to know how to get off diabetes medications, to be healthier to prolong life  Pt states she typically does not eat when not hungry. Pt states she drinks coffee throughout the day and it fills her up; will not eat. Pt reports smoking cigarettes; states she quit once and then stress led her to smoking again. Pt states she wants to quit.   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 Pt states she sometimes forgets to take medications. Pt states she does not sleep well at night. Pt states she is tired often during thee day. Pt reports watching her granddaughter during the day on Mon, Wed, and Fri.  Next visit-chronic complications, acute complications, smoking, support group, etc.   There were no vitals taken for this visit. There is no height or weight on file to calculate BMI.   Diabetes Self-Management Education - 02/07/18 0810      Health Coping   How would you rate your overall health?  Fair      Psychosocial Assessment   Patient Belief/Attitude about Diabetes  Motivated to manage diabetes    Self-care barriers  Other (comment)   sometimes forgets to take medications   Self-management support  Doctor's office;Family;Friends    Patient Concerns  Nutrition/Meal planning    Special Needs  Simplified materials    Preferred Learning Style  No preference indicated    Learning Readiness  Ready      Complications   Last HgB A1C per patient/outside source  6 %    How often do you check your blood sugar?  3-4 times/day    Fasting Blood glucose range (mg/dL)  70-129    Postprandial Blood glucose range (mg/dL)  130-179;70-129   83-134   Number of hypoglycemic episodes per  month  --   often, like every other day   Number of hyperglycemic episodes per week  3    Can you tell when your blood sugar is high?  No    Have you had a dilated eye exam in the past 12 months?  Yes    Have you had a dental exam in the past 12 months?  No    Are you checking your feet?  Yes    How many days per week are you checking your feet?  7      Dietary Intake   Breakfast  sometimes skips; cereal     Snack (morning)  none    Lunch  typically skips    Snack (afternoon)  none    Dinner  boiled chicken + rice + green beans    Snack (evening)  none    Beverage(s)  coffee, whole milk, water      Exercise   Exercise Type  ADL's    How many days per week to you exercise?  0    How many minutes per day do you exercise?  0    Total minutes per week of exercise  0      Patient Education   Previous Diabetes Education  No    Disease state   Definition of diabetes, type 1 and 2, and the diagnosis of diabetes;Factors that contribute to  the development of diabetes    Nutrition management   Role of diet in the treatment of diabetes and the relationship between the three main macronutrients and blood glucose level    Monitoring  Purpose and frequency of SMBG.    Psychosocial adjustment  Role of stress on diabetes    Personal strategies to promote health  Review risk of smoking and offered smoking cessation      Individualized Goals (developed by patient)   Nutrition  Follow meal plan discussed;General guidelines for healthy choices and portions discussed    Physical Activity  Exercise 3-5 times per week;30 minutes per day    Medications  take my medication as prescribed    Monitoring   test blood glucose pre and post meals as discussed    Reducing Risk  stop smoking      Post-Education Assessment   Patient understands the diabetes disease and treatment process.  Demonstrates understanding / competency    Patient understands incorporating nutritional management into lifestyle.  Needs  Review    Patient undertands incorporating physical activity into lifestyle.  Needs Review    Patient understands using medications safely.  Demonstrates understanding / competency    Patient understands monitoring blood glucose, interpreting and using results  Needs Review    Patient understands prevention, detection, and treatment of acute complications.  Needs Instruction    Patient understands prevention, detection, and treatment of chronic complications.  Needs Instruction    Patient understands how to develop strategies to address psychosocial issues.  Needs Instruction    Patient understands how to develop strategies to promote health/change behavior.  Needs Instruction      Outcomes   Program Status  Not Completed       Learning Objective:  Patient will have a greater understanding of diabetes self-management. Patient education plan is to attend individual and/or group sessions per assessed needs and concerns.   Plan:   Patient Instructions  - Aim to eat throughout the day, including breakfast, lunch and dinner.   - Aim to have non-starchy vegetables with lunch and dinner.   - Have water with meals instead of coffee.     Expected Outcomes:  Demonstrated interest in learning. Expect positive outcomes  Education material provided: ADA Diabetes: Your Take Control Guide  If problems or questions, patient to contact team via:  Phone and Email  Future DSME appointment: - 2 wks

## 2018-02-07 NOTE — Patient Instructions (Signed)
-   Aim to eat throughout the day, including breakfast, lunch and dinner.   - Aim to have non-starchy vegetables with lunch and dinner.   - Have water with meals instead of coffee.

## 2018-02-08 ENCOUNTER — Encounter: Payer: Self-pay | Admitting: Internal Medicine

## 2018-02-08 ENCOUNTER — Ambulatory Visit (INDEPENDENT_AMBULATORY_CARE_PROVIDER_SITE_OTHER): Payer: Medicare HMO | Admitting: Internal Medicine

## 2018-02-08 VITALS — BP 110/74 | HR 74 | Ht 64.0 in | Wt 173.0 lb

## 2018-02-08 DIAGNOSIS — K219 Gastro-esophageal reflux disease without esophagitis: Secondary | ICD-10-CM | POA: Diagnosis not present

## 2018-02-08 DIAGNOSIS — Z86010 Personal history of colonic polyps: Secondary | ICD-10-CM | POA: Diagnosis not present

## 2018-02-08 DIAGNOSIS — R1310 Dysphagia, unspecified: Secondary | ICD-10-CM | POA: Diagnosis not present

## 2018-02-08 MED ORDER — NA SULFATE-K SULFATE-MG SULF 17.5-3.13-1.6 GM/177ML PO SOLN: 1.0000 | Freq: Once | ORAL | 0 refills | Status: AC

## 2018-02-08 NOTE — Patient Instructions (Signed)

## 2018-02-08 NOTE — Progress Notes (Signed)
HISTORY OF PRESENT ILLNESS:  Crystal Stafford is a 58 y.o. female with a history of type 2 diabetes mellitus who is sent today by her primary care provider Dr. Dema Severin regarding new onset dysphasia.  Patient has longstanding reflux symptoms including regurgitation and pyrosis.  She is not certain but it sounds like she has been on PPI sporadically.  Currently on omeprazole 40 mg daily which is helpful, though incompletely.  She tells me that she has had intermittent solid food dysphasia for a few weeks.  Items such as chicken problematic.  She is having some nausea without vomiting.  As well mild epigastric discomfort.  No weight loss.  No change in bowel habits though she will experience occasional diarrhea felt secondary to metformin.  Patient did undergo routine screening colonoscopy March 2015.  She was found to have 2 polyps 1 of which was a sessile serrated polyp.  Follow-up in 5 years recommended.  Her GI review of systems is otherwise negative.  Review of outside blood work from December 12, 2017 finds unremarkable comprehensive metabolic panel and CBC.  Hemoglobin 12.1.  Relevant x-ray imaging shows CT scan 2016 with possible UTI.  Cute abnormality  REVIEW OF SYSTEMS:  All non-GI ROS negative unless otherwise stated in the HPI except for back pain, headaches, excessive thirst, excessive urination  Past Medical History:  Diagnosis Date  . Anxiety   . Arthritis   . Depression   . GERD (gastroesophageal reflux disease)   . Headache   . Hyperlipidemia   . Hypertension   . MRSA (methicillin resistant Staphylococcus aureus)   . Pre-diabetes     Past Surgical History:  Procedure Laterality Date  . ABDOMINAL HYSTERECTOMY    . IR ANGIO INTRA EXTRACRAN SEL COM CAROTID INNOMINATE UNI R MOD SED  12/12/2017  . IR ANGIO INTRA EXTRACRAN SEL INTERNAL CAROTID BILAT MOD SED  10/20/2016  . IR ANGIO INTRA EXTRACRAN SEL INTERNAL CAROTID BILAT MOD SED  12/09/2016  . IR ANGIO INTRA EXTRACRAN SEL INTERNAL  CAROTID BILAT MOD SED  06/16/2017  . IR ANGIO INTRA EXTRACRAN SEL INTERNAL CAROTID UNI L MOD SED  12/12/2017  . IR ANGIO VERTEBRAL SEL VERTEBRAL BILAT MOD SED  10/20/2016  . IR ANGIO VERTEBRAL SEL VERTEBRAL BILAT MOD SED  06/16/2017  . IR ANGIO VERTEBRAL SEL VERTEBRAL UNI R MOD SED  12/12/2017  . IR ANGIOGRAM FOLLOW UP STUDY  12/09/2016  . IR RADIOLOGY PERIPHERAL GUIDED IV START  12/12/2017  . IR TRANSCATH/EMBOLIZ  12/09/2016  . IR US GUIDE VASC ACCESS RIGHT  06/16/2017  . IR US GUIDE VASC ACCESS RIGHT  12/12/2017  . RADIOLOGY WITH ANESTHESIA N/A 12/09/2016   Procedure: Pipeline embolization of aneurysm and possible coiling;  Surgeon: Consuella Lose, MD;  Location: [facility];  Service: Radiology;  Laterality: N/A;    Social History Crystal Stafford  reports that she has been smoking cigarettes. She has a 10.00 pack-year smoking history. She has never used smokeless tobacco. She reports that she does not drink alcohol or use drugs.  family history includes Breast cancer in her maternal aunt; Colon cancer (age of onset: 18) in her maternal grandfather; Diabetes in an other family member; Hypertension in an other family member.  Allergies  Allergen Reactions  . Doxycycline Shortness Of Breath, Swelling and Rash    Face swelling  . Doxycycline Rash  . Tape Itching and Rash       PHYSICAL EXAMINATION: Vital signs: BP 110/74   Pulse 74  Ht 5\' 4"  (1.626 m)   Wt 173 lb (78.5 kg)   BMI 29.70 kg/m   Constitutional: generally well-appearing, no acute distress Psychiatric: alert and oriented x3, cooperative Eyes: extraocular movements intact, anicteric, conjunctiva pink Mouth: oral pharynx moist, no lesions Neck: supple no lymphadenopathy Cardiovascular: heart regular rate and rhythm, no murmur Lungs: clear to auscultation bilaterally Abdomen: soft, nontender, nondistended, no obvious ascites, no peritoneal signs, normal bowel sounds, no organomegaly Rectal: Deferred until  colonoscopy Extremities: no clubbing, cyanosis, or lower extremity edema bilaterally Skin: no lesions on visible extremities Neuro: No focal deficits.  Cranial nerves intact  ASSESSMENT:  1.  Chronic GERD.  Improved but incompletely so with PPI therapy 2.  Intermittent solid food dysphasia.  New onset.  Suspect peptic stricture and/or significant esophageal edema.  However, diabetic.  Rule out esophageal candidiasis 3.  Obesity 4.  Diabetes mellitus 5.  History of sessile serrated polyp.  Due for follow-up surveillance colonoscopy   PLAN:  1.  Reflux precautions 2.  Continue omeprazole 40 mg daily 3.  Weight loss 4.  Schedule upper endoscopy with possible esophageal dilation.The nature of the procedure, as well as the risks, benefits, and alternatives were carefully and thoroughly reviewed with the patient. Ample time for discussion and questions allowed. The patient understood, was satisfied, and agreed to proceed. 5.  Schedule surveillance colonoscopy with polypectomy if necessary.The nature of the procedure, as well as the risks, benefits, and alternatives were carefully and thoroughly reviewed with the patient. Ample time for discussion and questions allowed. The patient understood, was satisfied, and agreed to proceed. 6.  Hold metformin the day of the procedure to avoid unwanted hypoglycemia 7.  Further recommendations pending the outcome of the above studies  A copy of this consultation note has been sent to Dr. Dema Severin

## 2018-02-09 ENCOUNTER — Encounter (HOSPITAL_COMMUNITY): Payer: Self-pay | Admitting: Radiology

## 2018-02-21 DIAGNOSIS — Z6830 Body mass index (BMI) 30.0-30.9, adult: Secondary | ICD-10-CM | POA: Diagnosis not present

## 2018-02-21 DIAGNOSIS — I10 Essential (primary) hypertension: Secondary | ICD-10-CM | POA: Diagnosis not present

## 2018-02-21 DIAGNOSIS — I671 Cerebral aneurysm, nonruptured: Secondary | ICD-10-CM | POA: Diagnosis not present

## 2018-02-22 ENCOUNTER — Ambulatory Visit: Payer: Medicare HMO | Admitting: Registered"

## 2018-02-28 ENCOUNTER — Encounter: Payer: Medicare HMO | Admitting: Internal Medicine

## 2018-02-28 ENCOUNTER — Ambulatory Visit (AMBULATORY_SURGERY_CENTER): Payer: Medicare HMO | Admitting: Internal Medicine

## 2018-02-28 ENCOUNTER — Encounter: Payer: Self-pay | Admitting: Internal Medicine

## 2018-02-28 VITALS — BP 125/91 | HR 92 | Temp 96.6°F | Resp 14 | Ht 64.0 in | Wt 173.0 lb

## 2018-02-28 DIAGNOSIS — D123 Benign neoplasm of transverse colon: Secondary | ICD-10-CM

## 2018-02-28 DIAGNOSIS — Z86010 Personal history of colonic polyps: Secondary | ICD-10-CM | POA: Diagnosis not present

## 2018-02-28 DIAGNOSIS — K2980 Duodenitis without bleeding: Secondary | ICD-10-CM | POA: Diagnosis not present

## 2018-02-28 DIAGNOSIS — D120 Benign neoplasm of cecum: Secondary | ICD-10-CM | POA: Diagnosis not present

## 2018-02-28 DIAGNOSIS — D124 Benign neoplasm of descending colon: Secondary | ICD-10-CM

## 2018-02-28 DIAGNOSIS — R1310 Dysphagia, unspecified: Secondary | ICD-10-CM | POA: Diagnosis not present

## 2018-02-28 DIAGNOSIS — K219 Gastro-esophageal reflux disease without esophagitis: Secondary | ICD-10-CM

## 2018-02-28 MED ORDER — SODIUM CHLORIDE 0.9 % IV SOLN: 500.0000 mL | Freq: Once | INTRAVENOUS | Status: DC

## 2018-02-28 NOTE — Op Note (Signed)
[facility] Patient Name: Crystal Stafford Procedure Date: 02/28/2018 2:40 PM MRN: [id number] Endoscopist: Docia Chuck. Henrene Pastor , MD Age: 58 Referring MD:  Date of Birth: Jul 19, 1960 Gender: Female Account #: [id number] Procedure:                Colonoscopy with cold snare polypectomy x 5 Indications:              High risk colon cancer surveillance: Personal                            history of sessile serrated colon polyp (less than                            10 mm in size) with no dysplasia. Previous                            examination March 2015 Medicines:                Monitored Anesthesia Care Procedure:                Pre-Anesthesia Assessment:                           - Prior to the procedure, a History and Physical                            was performed, and patient medications and                            allergies were reviewed. The patient's tolerance of                            previous anesthesia was also reviewed. The risks                            and benefits of the procedure and the sedation                            options and risks were discussed with the patient.                            All questions were answered, and informed consent                            was obtained. Prior Anticoagulants: The patient has                            taken no previous anticoagulant or antiplatelet                            agents. ASA Grade Assessment: II - A patient with                            mild systemic disease. After reviewing the risks  and benefits, the patient was deemed in                            satisfactory condition to undergo the procedure.                           After obtaining informed consent, the colonoscope                            was passed under direct vision. Throughout the                            procedure, the patient's blood pressure, pulse, and                            oxygen saturations  were monitored continuously. The                            Colonoscope was introduced through the anus and                            advanced to the the cecum, identified by                            appendiceal orifice and ileocecal valve. The                            ileocecal valve, appendiceal orifice, and rectum                            were photographed. The quality of the bowel                            preparation was excellent. The colonoscopy was                            performed without difficulty. The patient tolerated                            the procedure well. The bowel preparation used was                            SUPREP. Scope In: 2:43:09 PM Scope Out: 2:59:04 PM Scope Withdrawal Time: 0 hours 12 minutes 41 seconds  Total Procedure Duration: 0 hours 15 minutes 55 seconds  Findings:                 Five polyps were found in the descending colon,                            transverse colon and cecum. The polyps were 2 to 4                            mm in size. These polyps were removed with a cold  snare. Resection and retrieval were complete.                           The exam was otherwise without abnormality on                            direct and retroflexion views. Small internal                            hemorrhoids. Complications:            No immediate complications. Estimated blood loss:                            None. Estimated Blood Loss:     Estimated blood loss: none. Impression:               - Five 2 to 4 mm polyps in the descending colon, in                            the transverse colon and in the cecum, removed with                            a cold snare. Resected and retrieved.                           - The examination was otherwise normal on direct                            and retroflexion views. Recommendation:           - Repeat colonoscopy in 3 - 5 years for                             surveillance.                           - Patient has a contact number available for                            emergencies. The signs and symptoms of potential                            delayed complications were discussed with the                            patient. Return to normal activities tomorrow.                            Written discharge instructions were provided to the                            patient.                           - Resume previous diet.                           -  Continue present medications.                           - Await pathology results. Docia Chuck. Henrene Pastor, MD 02/28/2018 3:03:42 PM This report has been signed electronically.

## 2018-02-28 NOTE — Op Note (Signed)
[facility] Patient Name: Crystal Stafford Procedure Date: 02/28/2018 2:40 PM MRN: [id number] Endoscopist: Docia Chuck. Henrene Pastor , MD Age: 58 Referring MD:  Date of Birth: November 17, 1960 Gender: Female Account #: [id number] Procedure:                Upper GI endoscopy with [facility] dilation of the                            esophagus. [address] [place] Indications:              Dysphagia (intermittent solid food), Esophageal                            reflux Medicines:                Monitored Anesthesia Care Procedure:                Pre-Anesthesia Assessment:                           - Prior to the procedure, a History and Physical                            was performed, and patient medications and                            allergies were reviewed. The patient's tolerance of                            previous anesthesia was also reviewed. The risks                            and benefits of the procedure and the sedation                            options and risks were discussed with the patient.                            All questions were answered, and informed consent                            was obtained. Prior Anticoagulants: The patient has                            taken no previous anticoagulant or antiplatelet                            agents. ASA Grade Assessment: II - A patient with                            mild systemic disease. After reviewing the risks                            and benefits, the patient was deemed in  satisfactory condition to undergo the procedure.                           After obtaining informed consent, the endoscope was                            passed under direct vision. Throughout the                            procedure, the patient's blood pressure, pulse, and                            oxygen saturations were monitored continuously. The                            Endoscope was introduced through the mouth, and                          advanced to the second part of duodenum. The upper                            GI endoscopy was accomplished without difficulty.                            The patient tolerated the procedure well. Scope In: Scope Out: Findings:                 The esophagus was normal with no obvious or                            significant stricture appreciated. The scope was                            withdrawn after completing the endoscopic survey.                            For subtle stricture possibly not appreciated on                            endoscopy, dilation was performed with a Maloney                            dilator with no resistance at 54 Fr.                           The stomach was normal save a small sliding hiatal                            hernia.                           The examined duodenum revealed erythema and tiny                            superficial erosions consistent with peptic  duodenitis. The duodenum was otherwise normal.                            Biopsies were taken with a cold forceps for                            Helicobacter pylori testing using CLOtest.                           The cardia and gastric fundus were normal on                            retroflexion. Complications:            No immediate complications. Estimated Blood Loss:     Estimated blood loss: none. Impression:               1. GERD.                           2. Intermittent solid food dysphasia likely                            secondary to subtle ring or stricture not                            appreciated on endoscopy status post dilation with                            54 French Maloney dilator                           3. Mild duodenitis status post CLO biopsy. Recommendation:           - Patient has a contact number available for                            emergencies. The signs and symptoms of potential                             delayed complications were discussed with the                            patient. Return to normal activities tomorrow.                            Written discharge instructions were provided to the                            patient.                           - Post dilation diet.                           - Continue present medications.                           -  Await pathology results.                           - Routine GI follow-up with Dr. Henrene Pastor in 1 year.                            Sooner if needed [employer]. Henrene Pastor, MD 02/28/2018 3:18:35 PM This report has been signed electronically.

## 2018-02-28 NOTE — Progress Notes (Signed)
To PACU, VSS. Report to [email or web address] 

## 2018-02-28 NOTE — Patient Instructions (Signed)
Please read handouts provided. Continue present medications. Await pathology results. Follow Dilation Diet. Routine GI follow-up with Dr. Henrene Pastor in 1 year, sooner if needed.      YOU HAD AN ENDOSCOPIC PROCEDURE TODAY AT [facility]:   Refer to the procedure report that was given to you for any specific questions about what was found during the examination.  If the procedure report does not answer your questions, please call your gastroenterologist to clarify.  If you requested that your care partner not be given the details of your procedure findings, then the procedure report has been included in a sealed envelope for you to review at your convenience later.  YOU SHOULD EXPECT: Some feelings of bloating in the abdomen. Passage of more gas than usual.  Walking can help get rid of the air that was put into your GI tract during the procedure and reduce the bloating. If you had a lower endoscopy (such as a colonoscopy or flexible sigmoidoscopy) you may notice spotting of blood in your stool or on the toilet paper. If you underwent a bowel prep for your procedure, you may not have a normal bowel movement for a few days.  Please Note:  You might notice some irritation and congestion in your nose or some drainage.  This is from the oxygen used during your procedure.  There is no need for concern and it should clear up in a day or so.  SYMPTOMS TO REPORT IMMEDIATELY:   Following lower endoscopy (colonoscopy or flexible sigmoidoscopy):  Excessive amounts of blood in the stool  Significant tenderness or worsening of abdominal pains  Swelling of the abdomen that is new, acute  Fever of 100F or higher   Following upper endoscopy (EGD)  Vomiting of blood or coffee ground material  New chest pain or pain under the shoulder blades  Painful or persistently difficult swallowing  New shortness of breath  Fever of 100F or higher  Black, tarry-looking stools  For urgent or emergent  issues, a gastroenterologist can be reached at any hour by calling [phone].   DIET:    Drink plenty of fluids but you should avoid alcoholic beverages for 24 hours.  ACTIVITY:  You should plan to take it easy for the rest of today and you should NOT DRIVE or use heavy machinery until tomorrow (because of the sedation medicines used during the test).    FOLLOW UP: Our staff will call the number listed on your records the next business day following your procedure to check on you and address any questions or concerns that you may have regarding the information given to you following your procedure. If we do not reach you, we will leave a message.  However, if you are feeling well and you are not experiencing any problems, there is no need to return our call.  We will assume that you have returned to your regular daily activities without incident.  If any biopsies were taken you will be contacted by phone or by letter within the next 1-3 weeks.  Please call us at [phone] if you have not heard about the biopsies in 3 weeks.    SIGNATURES/CONFIDENTIALITY: You and/or your care partner have signed paperwork which will be entered into your electronic medical record.  These signatures attest to the fact that that the information above on your After Visit Summary has been reviewed and is understood.  Full responsibility of the confidentiality of this discharge information lies with you and/or  your care-partner. 

## 2018-02-28 NOTE — Progress Notes (Signed)
Called to room to assist during endoscopic procedure.  Patient ID and intended procedure confirmed with present staff. Received instructions for my participation in the procedure from the performing physician.  

## 2018-03-01 ENCOUNTER — Telehealth: Payer: Self-pay

## 2018-03-01 LAB — HELICOBACTER PYLORI SCREEN-BIOPSY: UREASE: NEGATIVE

## 2018-03-01 NOTE — Telephone Encounter (Signed)
  Follow up Call-  Call back number 02/28/2018  Post procedure Call Back phone  # [phone]  Permission to leave phone message Yes  Some recent data might be hidden     Patient questions:  Do you have a fever, pain , or abdominal swelling? No. Pain Score  0 *  Have you tolerated food without any problems? Yes.    Have you been able to return to your normal activities? Yes.    Do you have any questions about your discharge instructions: Diet   No. Medications  No. Follow up visit  No.  Do you have questions or concerns about your Care? No.  Actions: * If pain score is 4 or above: No action needed, pain <4.

## 2018-03-05 ENCOUNTER — Encounter: Payer: Self-pay | Admitting: Internal Medicine

## 2018-04-23 DIAGNOSIS — I10 Essential (primary) hypertension: Secondary | ICD-10-CM | POA: Diagnosis not present

## 2018-04-23 DIAGNOSIS — E785 Hyperlipidemia, unspecified: Secondary | ICD-10-CM | POA: Diagnosis not present

## 2018-04-23 DIAGNOSIS — E1169 Type 2 diabetes mellitus with other specified complication: Secondary | ICD-10-CM | POA: Diagnosis not present

## 2018-05-10 DIAGNOSIS — A599 Trichomoniasis, unspecified: Secondary | ICD-10-CM | POA: Diagnosis not present

## 2018-05-10 DIAGNOSIS — Z113 Encounter for screening for infections with a predominantly sexual mode of transmission: Secondary | ICD-10-CM | POA: Diagnosis not present

## 2018-05-10 DIAGNOSIS — N939 Abnormal uterine and vaginal bleeding, unspecified: Secondary | ICD-10-CM | POA: Insufficient documentation

## 2018-05-10 DIAGNOSIS — R350 Frequency of micturition: Secondary | ICD-10-CM | POA: Diagnosis not present

## 2018-05-10 DIAGNOSIS — R140 Abdominal distension (gaseous): Secondary | ICD-10-CM | POA: Insufficient documentation

## 2018-06-07 DIAGNOSIS — N950 Postmenopausal bleeding: Secondary | ICD-10-CM | POA: Diagnosis not present

## 2018-06-07 DIAGNOSIS — Z1231 Encounter for screening mammogram for malignant neoplasm of breast: Secondary | ICD-10-CM | POA: Diagnosis not present

## 2018-06-07 DIAGNOSIS — Z01419 Encounter for gynecological examination (general) (routine) without abnormal findings: Secondary | ICD-10-CM | POA: Diagnosis not present

## 2018-08-31 DIAGNOSIS — M79604 Pain in right leg: Secondary | ICD-10-CM | POA: Diagnosis not present

## 2018-08-31 DIAGNOSIS — E1169 Type 2 diabetes mellitus with other specified complication: Secondary | ICD-10-CM | POA: Diagnosis not present

## 2018-08-31 DIAGNOSIS — K219 Gastro-esophageal reflux disease without esophagitis: Secondary | ICD-10-CM | POA: Diagnosis not present

## 2018-08-31 DIAGNOSIS — R3915 Urgency of urination: Secondary | ICD-10-CM | POA: Diagnosis not present

## 2018-08-31 DIAGNOSIS — I10 Essential (primary) hypertension: Secondary | ICD-10-CM | POA: Diagnosis not present

## 2018-08-31 DIAGNOSIS — R51 Headache: Secondary | ICD-10-CM | POA: Diagnosis not present

## 2018-08-31 DIAGNOSIS — E785 Hyperlipidemia, unspecified: Secondary | ICD-10-CM | POA: Diagnosis not present

## 2018-08-31 DIAGNOSIS — F17200 Nicotine dependence, unspecified, uncomplicated: Secondary | ICD-10-CM | POA: Diagnosis not present

## 2018-08-31 DIAGNOSIS — M79605 Pain in left leg: Secondary | ICD-10-CM | POA: Diagnosis not present

## 2018-09-14 DIAGNOSIS — E1169 Type 2 diabetes mellitus with other specified complication: Secondary | ICD-10-CM | POA: Diagnosis not present

## 2018-09-14 DIAGNOSIS — E785 Hyperlipidemia, unspecified: Secondary | ICD-10-CM | POA: Diagnosis not present

## 2018-09-14 DIAGNOSIS — Z23 Encounter for immunization: Secondary | ICD-10-CM | POA: Diagnosis not present

## 2018-11-27 DIAGNOSIS — G453 Amaurosis fugax: Secondary | ICD-10-CM | POA: Diagnosis not present

## 2018-12-04 DIAGNOSIS — G453 Amaurosis fugax: Secondary | ICD-10-CM | POA: Diagnosis not present

## 2018-12-04 DIAGNOSIS — R0981 Nasal congestion: Secondary | ICD-10-CM | POA: Diagnosis not present

## 2018-12-04 DIAGNOSIS — J3089 Other allergic rhinitis: Secondary | ICD-10-CM | POA: Diagnosis not present

## 2018-12-05 DIAGNOSIS — F331 Major depressive disorder, recurrent, moderate: Secondary | ICD-10-CM | POA: Diagnosis not present

## 2018-12-05 DIAGNOSIS — I10 Essential (primary) hypertension: Secondary | ICD-10-CM | POA: Diagnosis not present

## 2018-12-05 DIAGNOSIS — E785 Hyperlipidemia, unspecified: Secondary | ICD-10-CM | POA: Diagnosis not present

## 2018-12-05 DIAGNOSIS — E1169 Type 2 diabetes mellitus with other specified complication: Secondary | ICD-10-CM | POA: Diagnosis not present

## 2018-12-10 ENCOUNTER — Other Ambulatory Visit: Payer: Self-pay | Admitting: Family Medicine

## 2018-12-10 DIAGNOSIS — G453 Amaurosis fugax: Secondary | ICD-10-CM

## 2018-12-13 ENCOUNTER — Other Ambulatory Visit: Payer: Self-pay | Admitting: Family Medicine

## 2018-12-13 DIAGNOSIS — G453 Amaurosis fugax: Secondary | ICD-10-CM

## 2018-12-18 DIAGNOSIS — G453 Amaurosis fugax: Secondary | ICD-10-CM | POA: Diagnosis not present

## 2018-12-20 DIAGNOSIS — Z0000 Encounter for general adult medical examination without abnormal findings: Secondary | ICD-10-CM | POA: Diagnosis not present

## 2018-12-20 DIAGNOSIS — F419 Anxiety disorder, unspecified: Secondary | ICD-10-CM | POA: Diagnosis not present

## 2018-12-20 DIAGNOSIS — I10 Essential (primary) hypertension: Secondary | ICD-10-CM | POA: Diagnosis not present

## 2018-12-20 DIAGNOSIS — J3089 Other allergic rhinitis: Secondary | ICD-10-CM | POA: Diagnosis not present

## 2018-12-20 DIAGNOSIS — F17200 Nicotine dependence, unspecified, uncomplicated: Secondary | ICD-10-CM | POA: Diagnosis not present

## 2018-12-20 DIAGNOSIS — E1169 Type 2 diabetes mellitus with other specified complication: Secondary | ICD-10-CM | POA: Diagnosis not present

## 2018-12-20 DIAGNOSIS — E785 Hyperlipidemia, unspecified: Secondary | ICD-10-CM | POA: Diagnosis not present

## 2018-12-20 DIAGNOSIS — G453 Amaurosis fugax: Secondary | ICD-10-CM | POA: Diagnosis not present

## 2018-12-20 DIAGNOSIS — F331 Major depressive disorder, recurrent, moderate: Secondary | ICD-10-CM | POA: Diagnosis not present

## 2018-12-24 ENCOUNTER — Other Ambulatory Visit: Payer: Medicare HMO

## 2018-12-25 ENCOUNTER — Other Ambulatory Visit (HOSPITAL_COMMUNITY): Payer: Self-pay | Admitting: Family Medicine

## 2018-12-25 DIAGNOSIS — G453 Amaurosis fugax: Secondary | ICD-10-CM

## 2018-12-31 DIAGNOSIS — G453 Amaurosis fugax: Secondary | ICD-10-CM | POA: Diagnosis not present

## 2018-12-31 DIAGNOSIS — R42 Dizziness and giddiness: Secondary | ICD-10-CM | POA: Diagnosis not present

## 2018-12-31 DIAGNOSIS — H538 Other visual disturbances: Secondary | ICD-10-CM | POA: Diagnosis not present

## 2018-12-31 DIAGNOSIS — Z8679 Personal history of other diseases of the circulatory system: Secondary | ICD-10-CM | POA: Diagnosis not present

## 2018-12-31 DIAGNOSIS — I639 Cerebral infarction, unspecified: Secondary | ICD-10-CM | POA: Diagnosis not present

## 2018-12-31 DIAGNOSIS — R519 Headache, unspecified: Secondary | ICD-10-CM | POA: Diagnosis not present

## 2019-01-02 ENCOUNTER — Ambulatory Visit (HOSPITAL_COMMUNITY): Payer: Medicare HMO | Attending: Cardiology

## 2019-01-02 ENCOUNTER — Other Ambulatory Visit: Payer: Self-pay

## 2019-01-02 DIAGNOSIS — G453 Amaurosis fugax: Secondary | ICD-10-CM | POA: Diagnosis not present

## 2019-01-10 DIAGNOSIS — E785 Hyperlipidemia, unspecified: Secondary | ICD-10-CM | POA: Diagnosis not present

## 2019-01-10 DIAGNOSIS — I10 Essential (primary) hypertension: Secondary | ICD-10-CM | POA: Diagnosis not present

## 2019-01-10 DIAGNOSIS — E1169 Type 2 diabetes mellitus with other specified complication: Secondary | ICD-10-CM | POA: Diagnosis not present

## 2019-01-10 DIAGNOSIS — F331 Major depressive disorder, recurrent, moderate: Secondary | ICD-10-CM | POA: Diagnosis not present

## 2019-01-16 DIAGNOSIS — R519 Headache, unspecified: Secondary | ICD-10-CM | POA: Diagnosis not present

## 2019-01-16 DIAGNOSIS — I671 Cerebral aneurysm, nonruptured: Secondary | ICD-10-CM | POA: Diagnosis not present

## 2019-01-17 ENCOUNTER — Other Ambulatory Visit: Payer: Self-pay

## 2019-01-17 ENCOUNTER — Ambulatory Visit (INDEPENDENT_AMBULATORY_CARE_PROVIDER_SITE_OTHER): Payer: Medicare HMO | Admitting: Allergy & Immunology

## 2019-01-17 ENCOUNTER — Encounter: Payer: Self-pay | Admitting: Allergy & Immunology

## 2019-01-17 VITALS — BP 120/80 | HR 96 | Temp 97.7°F | Resp 18 | Ht 63.0 in | Wt 163.4 lb

## 2019-01-17 DIAGNOSIS — R0602 Shortness of breath: Secondary | ICD-10-CM | POA: Diagnosis not present

## 2019-01-17 DIAGNOSIS — J302 Other seasonal allergic rhinitis: Secondary | ICD-10-CM

## 2019-01-17 DIAGNOSIS — J3089 Other allergic rhinitis: Secondary | ICD-10-CM | POA: Diagnosis not present

## 2019-01-17 MED ORDER — FLUTICASONE PROPIONATE 50 MCG/ACT NA SUSP: 2.0000 | Freq: Every day | NASAL | 5 refills | Status: DC

## 2019-01-17 MED ORDER — CETIRIZINE HCL 10 MG PO TABS: 10.0000 mg | ORAL_TABLET | Freq: Two times a day (BID) | ORAL | 5 refills | Status: DC

## 2019-01-17 NOTE — Progress Notes (Signed)
NEW PATIENT  Date of Service/Encounter:  01/17/19  Referring provider: Harlan Stains, MD   Assessment:   SOB (shortness of breath)  Perennial and seasonal allergic rhinitis   Disabled status - due to history of anxiety/depression and aneurysms   Adverse social situation  Plan/Recommendations:   1. SOB (shortness of breath) - Lung testing looked great today. - I do not think that this is related to asthma. - There is no need for a daily controller medication at this point in time.   2. Chronic rhinitis - Testing today showed: grasses, ragweed, weeds, trees, indoor molds, outdoor molds, dust mites and dog - Copy of test results provided.  - Avoidance measures provided. - Start taking: Zyrtec (cetirizine) 10mg  tablet TWICE daily and Flonase (fluticasone) two sprays per nostril ONCE DAILY. - You can use an extra dose of the antihistamine, if needed, for breakthrough symptoms.  - Consider nasal saline rinses 1-2 times daily to remove allergens from the nasal cavities as well as help with mucous clearance (this is especially helpful to do before the nasal sprays are given) - I would strongly consider getting a dehumidifier for the apartment to decrease moisture, which will help decrease mold growth. - Consider allergy shots as a means of long-term control. - Allergy shots "re-train" and "reset" the immune system to ignore environmental allergens and decrease the resulting immune response to those allergens (sneezing, itchy watery eyes, runny nose, nasal congestion, etc).    - Allergy shots improve symptoms in 75-85% of patients.  - We can discuss more at the next appointment if the medications are not working for you.  3. Return in about 2 months (around 03/17/2019). This can be an in-person, a virtual Webex or a telephone follow up visit.   Subjective:   Crystal Stafford is a 59 y.o. female presenting today for evaluation of  Chief Complaint  Patient presents with  . Cough     last 2 months, wakes her up at night, has some mold exposures at her house    Crystal Stafford has a history of the following: Patient Active Problem List   Diagnosis Date Noted  . Cerebral aneurysm, nonruptured 12/09/2016  . Aneurysm ([place]) 09/29/2016  . 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 Hospital discharge follow-up 10/23/2014  . PICC (peripherally inserted central catheter) removal 10/23/2014  . Sepsis ([place])   . Leukocytosis 10/10/2014  . SIRS (systemic inflammatory response syndrome) ([place]) 10/10/2014  . Impaired glucose tolerance 10/10/2014  . Myalgia   . Essential hypertension 10/21/2013  . Midline low back pain with left-sided sciatica 10/21/2013  . Breast cancer screening 10/21/2013  . Gastroesophageal reflux disease without esophagitis 10/21/2013  . Encounter for screening mammogram for malignant neoplasm of breast 10/21/2013  . Numbness and tingling 05/20/2013  . Shoulder pain, right 02/18/2013  . Morbid obesity ([place]) 02/18/2013  . Physical exam, routine 10/25/2012  . Allergy 10/25/2012  . Hypertension 04/06/2012  . Prediabetes 04/06/2012  . Peptic ulcer 04/06/2012  . INSOMNIA 02/04/2010  . SEBACEOUS CYST, SCALP 08/21/2009  . HYPERLIPIDEMIA 06/02/2009  . HEADACHE 06/02/2009  . DEPRESSION 05/19/2009  . FATIGUE 05/19/2009  . 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 TOBACCO ABUSE 11/24/2006  . PELVIC PAIN, RIGHT 11/24/2006  . ASTHMA 11/10/2005    History obtained from: chart review and patient.  Crystal Stafford was referred by Harlan Stains, MD.     Crystal Stafford is a 59 y.o. female presenting for an evaluation of possible mold  allergies.  In 2018, she started having mold in her home. She called "the city" and they came out and wiped the windows down. It was fine for a few months and then in 2019 the same thing happened. They wiped off the window sills and left. She continued to wipe it off herself. This happened again in 2020 and the mold is even  worse than before. She did complain to her landlord and they apparently put in new windows to deal with it. Her rent increased to $750 with the window replacement. This is an apartment complex. She is clearly frustrated. Everything in her bedroom is "messed up" with the mold.   She does show me pictures of mold on her window as well as hats and her grandkids' toys. She is clearly distraught over this.    Asthma/Respiratory Symptom History: She has never been diagnosed with asthma. She reports SOB and coughing. She has not been on prednisone. She does not have an inhaler at all. Symptoms are definitely worse at night. Symptoms do improve when she leaves the house.   Allergic Rhinitis Symptom History: She reports congestion which is worse when she lays down at night. She had problems with smelling. She is using a nose spray, but she stopped because she was using so many medications. She is not using any cetirizine or Benadryl over the last few days. She is sneezing a lot as well as coughing. She reports marked mucous production. She denies problems with animals. Symptoms overall have not changed since they started.   She is on disability from anxiety/depression as well as brain aneurysms. Otherwise, there is no history of other atopic diseases, including food allergies, drug allergies, stinging insect allergies, eczema, urticaria or contact dermatitis. There is no significant infectious history. Vaccinations are up to date.    Past Medical History: Patient Active Problem List   Diagnosis Date Noted  . Cerebral aneurysm, nonruptured 12/09/2016  . Aneurysm ([place]) 09/29/2016  . 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 Hospital discharge follow-up 10/23/2014  . PICC (peripherally inserted central catheter) removal 10/23/2014  . Sepsis ([place])   . Leukocytosis 10/10/2014  . SIRS (systemic inflammatory response syndrome) ([place]) 10/10/2014  . Impaired glucose tolerance 10/10/2014  . Myalgia   .  Essential hypertension 10/21/2013  . Midline low back pain with left-sided sciatica 10/21/2013  . Breast cancer screening 10/21/2013  . Gastroesophageal reflux disease without esophagitis 10/21/2013  . Encounter for screening mammogram for malignant neoplasm of breast 10/21/2013  . Numbness and tingling 05/20/2013  . Shoulder pain, right 02/18/2013  . Morbid obesity ([place]) 02/18/2013  . Physical exam, routine 10/25/2012  . Allergy 10/25/2012  . Hypertension 04/06/2012  . Prediabetes 04/06/2012  . Peptic ulcer 04/06/2012  . INSOMNIA 02/04/2010  . SEBACEOUS CYST, SCALP 08/21/2009  . HYPERLIPIDEMIA 06/02/2009  . HEADACHE 06/02/2009  . DEPRESSION 05/19/2009  . FATIGUE 05/19/2009  . 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 TOBACCO ABUSE 11/24/2006  . PELVIC PAIN, RIGHT 11/24/2006  . ASTHMA 11/10/2005    Medication List:  Allergies as of 01/17/2019      Reactions   Doxycycline Shortness Of Breath, Swelling, Rash   Face swelling   Doxycycline Rash   Tape Itching, Rash      Medication List       Accurate as of January 17, 2019 11:21 AM. If you have any questions, ask your nurse or doctor.        aspirin 325 MG tablet Take 162  mg by mouth daily.   atorvastatin 20 MG tablet Commonly known as: LIPITOR Take 1 tablet (20 mg total) by mouth daily.   escitalopram 10 MG tablet Commonly known as: LEXAPRO   hydrOXYzine 25 MG tablet Commonly known as: ATARAX/VISTARIL   metFORMIN 500 MG 24 hr tablet Commonly known as: GLUCOPHAGE-XR TAKE 1 TABLET BY MOUTH ONCE DAILY WITH BREAKFAST   montelukast 10 MG tablet Commonly known as: SINGULAIR   omeprazole 40 MG capsule Commonly known as: PRILOSEC Take 40 mg by mouth daily.   valsartan 80 MG tablet Commonly known as: DIOVAN Take 80 mg by mouth daily.       Birth History: non-contributory  Developmental History: non-contributory  Past Surgical History: Past Surgical History:  Procedure Laterality Date  . ABDOMINAL  HYSTERECTOMY    . COLONOSCOPY    . IR ANGIO INTRA EXTRACRAN SEL COM CAROTID INNOMINATE UNI R MOD SED  12/12/2017  . IR ANGIO INTRA EXTRACRAN SEL INTERNAL CAROTID BILAT MOD SED  10/20/2016  . IR ANGIO INTRA EXTRACRAN SEL INTERNAL CAROTID BILAT MOD SED  12/09/2016  . IR ANGIO INTRA EXTRACRAN SEL INTERNAL CAROTID BILAT MOD SED  06/16/2017  . IR ANGIO INTRA EXTRACRAN SEL INTERNAL CAROTID UNI L MOD SED  12/12/2017  . IR ANGIO VERTEBRAL SEL VERTEBRAL BILAT MOD SED  10/20/2016  . IR ANGIO VERTEBRAL SEL VERTEBRAL BILAT MOD SED  06/16/2017  . IR ANGIO VERTEBRAL SEL VERTEBRAL UNI R MOD SED  12/12/2017  . IR ANGIOGRAM FOLLOW UP STUDY  12/09/2016  . IR RADIOLOGY PERIPHERAL GUIDED IV START  12/12/2017  . IR TRANSCATH/EMBOLIZ  12/09/2016  . IR US GUIDE VASC ACCESS RIGHT  06/16/2017  . IR US GUIDE VASC ACCESS RIGHT  12/12/2017  . RADIOLOGY WITH ANESTHESIA N/A 12/09/2016   Procedure: Pipeline embolization of aneurysm and possible coiling;  Surgeon: Consuella Lose, MD;  Location: [facility];  Service: Radiology;  Laterality: N/A;     Family History: Family History  Problem Relation Age of Onset  . Colon cancer Maternal Grandfather 46  . Heart disease Mother   . Breast cancer Maternal Aunt   . Hypertension Other   . Diabetes Other   . Esophageal cancer Neg Hx   . Stomach cancer Neg Hx   . Rectal cancer Neg Hx      Social History: Liduvina lives at home by herself.  She lives in an apartment.  There is carpeting throughout the apartment.  She has electric heating and central cooling.  There are no dust mite covers on the bedding.  She does smoke half a pack per day.  Indeed   Review of Systems  Constitutional: Negative.  Negative for chills, fever, malaise/fatigue and weight loss.  HENT: Positive for congestion, sinus pain and sore throat. Negative for ear discharge and ear pain.   Eyes: Negative for pain, discharge and redness.  Respiratory: Negative for cough, sputum production, shortness of breath and  wheezing.   Cardiovascular: Negative.  Negative for chest pain and palpitations.  Gastrointestinal: Negative for abdominal pain, constipation, diarrhea, heartburn, nausea and vomiting.  Skin: Negative.  Negative for itching and rash.  Neurological: Negative for dizziness and headaches.  Endo/Heme/Allergies: Negative for environmental allergies. Does not bruise/bleed easily.       Objective:   Blood pressure 120/80, pulse 96, temperature 97.7 F (36.5 C), temperature source Temporal, resp. rate 18, height 5\' 3"  (1.6 m), weight 163 lb 6.4 oz (74.1 kg), SpO2 99 %. Body mass index is 28.95 kg/m.  Physical Exam:   Physical Exam  Constitutional: She appears well-developed.  Pleasant female. Cooperative with the exam. Smells like cigarette smoke.   HENT:  Head: Normocephalic and atraumatic.  Right Ear: Tympanic membrane, external ear and ear canal normal. No drainage, swelling or tenderness. Tympanic membrane is not injected, not scarred, not erythematous, not retracted and not bulging.  Left Ear: Tympanic membrane, external ear and ear canal normal. No drainage, swelling or tenderness. Tympanic membrane is not injected, not scarred, not erythematous, not retracted and not bulging.  Nose: Mucosal edema present. No rhinorrhea, nasal deformity or septal deviation. No epistaxis. Right sinus exhibits no maxillary sinus tenderness and no frontal sinus tenderness. Left sinus exhibits no maxillary sinus tenderness and no frontal sinus tenderness.  Mouth/Throat: Uvula is midline and oropharynx is clear and moist. Mucous membranes are not pale and not dry.  There are no polyps appreciated. Turbinates are slightly erythematous with glistening clear mucous. There is no purulent discharge at all. No sinus tenderness noted.   Eyes: Pupils are equal, round, and reactive to light. Conjunctivae and EOM are normal. Right eye exhibits no chemosis and no discharge. Left eye exhibits no chemosis and no  discharge. Right conjunctiva is not injected. Left conjunctiva is not injected.  No injection bilaterally.  Cardiovascular: Normal rate, regular rhythm and normal heart sounds.  Respiratory: Effort normal and breath sounds normal. No accessory muscle usage. No tachypnea. No respiratory distress. She has no wheezes. She has no rhonchi. She has no rales. She exhibits no tenderness.  Moving air well in all lung fields. No increased work of breathing noted.   GI: There is no abdominal tenderness. There is no rebound and no guarding.  Lymphadenopathy:       Head (right side): No submandibular, no tonsillar and no occipital adenopathy present.       Head (left side): No submandibular, no tonsillar and no occipital adenopathy present.    She has no cervical adenopathy.  Neurological: She is alert.  Skin: No abrasion, no petechiae and no rash noted. Rash is not papular, not vesicular and not urticarial. No erythema. No pallor.  Psychiatric: She has a normal mood and affect.     Diagnostic studies:    Spirometry: results normal (FEV1: 2.13/101%, FVC: 2.83/107%, FEV1/FVC: 75%).    Spirometry consistent with normal pattern.   Allergy Studies:   Airborne Adult Perc - 21-Jan-2019 0915    Time Antigen Placed  0915    Allergen Manufacturer  Lavella Hammock    Location  Back    Number of Test  59    1. Control-Buffer 50% Glycerol  Negative    2. Control-Histamine 1 mg/ml  2+    3. Albumin saline  Negative    4. [place]  Negative    5. [place]  Negative    6. Johnson  Negative    7. Chunky Blue  Negative    8. Meadow Fescue  Negative    9. Perennial Rye  Negative    10. Sweet Vernal  Negative    11. Timothy  Negative    12. Cocklebur  Negative    13. Burweed [place]er  Negative    14. Ragweed, short  Negative    15. Ragweed, Giant  Negative    16. Plantain,  English  Negative    17. Lamb's Quarters  Negative    18. Sheep Sorrell  Negative    19. Rough Pigweed  Negative    20. Andreas Ohm, Rough  Negative    21. Mugwort, Common  Negative    22. Ash mix  Negative    23. Birch mix  Negative    24. Beech American  Negative    25. Box, Elder  Negative    26. Cedar, red  Negative    27. Cottonwood, [place]  Negative    28. Elm mix  Negative    29. Hickory mix  Negative    30. Maple mix  Negative    31. Oak, [place] mix  Negative    32. Pecan Pollen  Negative    33. Pine mix  Negative    34. Sycamore Eastern  Negative    35. Walnut, Black Pollen  Negative    36. Alternaria alternata  Negative    37. Cladosporium Herbarum  Negative    38. Aspergillus mix  Negative    39. Penicillium mix  Negative    40. Bipolaris sorokiniana (Helminthosporium)  Negative    41. Drechslera spicifera (Curvularia)  Negative    42. Mucor plumbeus  Negative    43. Fusarium moniliforme  Negative    44. Aureobasidium pullulans (pullulara)  Negative    45. Rhizopus oryzae  Negative    46. Botrytis cinera  Negative    47. Epicoccum nigrum  Negative    48. Phoma betae  Negative    49. Candida Albicans  Negative    50. Trichophyton mentagrophytes  Negative    51. Mite, D Farinae  5,000 AU/ml  Negative    52. Mite, D Pteronyssinus  5,000 AU/ml  Negative    53. Cat Hair 10,000 BAU/ml  Negative    54.  Dog Epithelia  Negative    55. Mixed Feathers  Negative    56. Horse Epithelia  Negative    57. Cockroach, German  Negative    58. Mouse  Negative    59. Tobacco Leaf  Negative     Intradermal - 01/17/19 0900    Time Antigen Placed  1005    Allergen Manufacturer  Lavella Hammock    Location  Arm    Number of Test  15    Control  Negative    [place]  2+    Johnson  Negative    7 Grass  1+    Ragweed mix  2+    Weed mix  Negative    Tree mix  1+    Mold 1  1+    Mold 2  1+    Mold 3  2+    Mold 4  2+    Cat  Negative    Dog  1+    Cockroach  Negative    Mite mix  3+       Allergy testing results were read and interpreted by myself, documented by clinical staff.         Salvatore Marvel, MD  [facility]

## 2019-01-17 NOTE — Patient Instructions (Addendum)
1. SOB (shortness of breath) - Lung testing looked great today. - I do not think that this is related to asthma. - There is no need for a daily controller medication at this point in time.   2. Chronic rhinitis - Testing today showed: grasses, ragweed, weeds, trees, indoor molds, outdoor molds, dust mites and dog - Copy of test results provided.  - Avoidance measures provided. - Start taking: Zyrtec (cetirizine) 10mg  tablet TWICE daily and Flonase (fluticasone) two sprays per nostril ONCE DAILY. - You can use an extra dose of the antihistamine, if needed, for breakthrough symptoms.  - Consider nasal saline rinses 1-2 times daily to remove allergens from the nasal cavities as well as help with mucous clearance (this is especially helpful to do before the nasal sprays are given) - I would strongly consider getting a dehumidifier for the apartment to decrease moisture, which will help decrease mold growth. - Consider allergy shots as a means of long-term control. - Allergy shots "re-train" and "reset" the immune system to ignore environmental allergens and decrease the resulting immune response to those allergens (sneezing, itchy watery eyes, runny nose, nasal congestion, etc).    - Allergy shots improve symptoms in 75-85% of patients.  - We can discuss more at the next appointment if the medications are not working for you.  3. Return in about 2 months (around 03/17/2019). This can be an in-person, a virtual Webex or a telephone follow up visit.   Please inform us of any Emergency Department visits, hospitalizations, or changes in symptoms. Call us before going to the ED for breathing or allergy symptoms since we might be able to fit you in for a sick visit. Feel free to contact us anytime with any questions, problems, or concerns.  It was a pleasure to meet you today! Good luck with everything - consider getting a new apartment, but trying to do this during a pandemic would be very difficult.    Websites that have reliable patient information: 1. American Academy of Asthma, Allergy, and Immunology: www.aaaai.org 2. Food Allergy Research and Education (FARE): foodallergy.org 3. Mothers of Asthmatics: http://www.asthmacommunitynetwork.org 4. American College of Allergy, Asthma, and Immunology: www.acaai.org  "Like" [place] on Facebook and Instagram for our latest updates!        Make sure you are registered to vote! If you have moved or changed any of your contact information, you will need to get this updated before voting!  In some cases, you MAY be able to register to vote online: [email or web address]    Reducing Pollen Exposure  The American Academy of Allergy, Asthma and Immunology suggests the following steps to reduce your exposure to pollen during allergy seasons.    1. Do not hang sheets or clothing out to dry; pollen may collect on these items. 2. Do not mow lawns or spend time around freshly cut grass; mowing stirs up pollen. 3. Keep windows closed at night.  Keep car windows closed while driving. 4. Minimize morning activities outdoors, a time when pollen counts are usually at their highest. 5. Stay indoors as much as possible when pollen counts or humidity is high and on windy days when pollen tends to remain in the air longer. 6. Use air conditioning when possible.  Many air conditioners have filters that trap the pollen spores. 7. Use a HEPA room air filter to remove pollen form the indoor air you breathe.  Control of Mold Allergen   Mold and fungi can grow on a variety of  surfaces provided certain temperature and moisture conditions exist.  Outdoor molds grow on plants, decaying vegetation and soil.  The major outdoor mold, Alternaria and Cladosporium, are found in very high numbers during hot and dry conditions.  Generally, a late Summer - Fall peak is seen for common outdoor fungal spores.  Rain will temporarily lower outdoor mold  spore count, but counts rise rapidly when the rainy period ends.  The most important indoor molds are Aspergillus and Penicillium.  Dark, humid and poorly ventilated basements are ideal sites for mold growth.  The next most common sites of mold growth are the bathroom and the kitchen.  Outdoor (Seasonal) Mold Control  Positive outdoor molds via skin testing: Alternaria, Cladosporium, Bipolaris (Helminthsporium), Drechslera (Curvalaria) and Mucor  1. Use air conditioning and keep windows closed 2. Avoid exposure to decaying vegetation. 3. Avoid leaf raking. 4. Avoid grain handling. 5. Consider wearing a face mask if working in moldy areas.  6.   Indoor (Perennial) Mold Control   Positive indoor molds via skin testing: Aspergillus, Penicillium, Fusarium, Aureobasidium (Pullulara) and Rhizopus  1. Maintain humidity below 50%. 2. Clean washable surfaces with 5% bleach solution. 3. Remove sources e.g. contaminated carpets.     Control of Dust Mite Allergen    Dust mites play a major role in allergic asthma and rhinitis.  They occur in environments with high humidity wherever human skin is found.  Dust mites absorb humidity from the atmosphere (ie, they do not drink) and feed on organic matter (including shed human and animal skin).  Dust mites are a microscopic type of insect that you cannot see with the naked eye.  High levels of dust mites have been detected from mattresses, pillows, carpets, upholstered furniture, bed covers, clothes, soft toys and any woven material.  The principal allergen of the dust mite is found in its feces.  A gram of dust may contain 1,000 mites and 250,000 fecal particles.  Mite antigen is easily measured in the air during house cleaning activities.  Dust mites do not bite and do not cause harm to humans, other than by triggering allergies/asthma.    Ways to decrease your exposure to dust mites in your home:  1. Encase mattresses, box springs and pillows with a  mite-impermeable barrier or cover   2. Wash sheets, blankets and drapes weekly in hot water (130 F) with detergent and dry them in a dryer on the hot setting.  3. Have the room cleaned frequently with a vacuum cleaner and a damp dust-mop.  For carpeting or rugs, vacuuming with a vacuum cleaner equipped with a high-efficiency particulate air (HEPA) filter.  The dust mite allergic individual should not be in a room which is being cleaned and should wait 1 hour after cleaning before going into the room. 4. Do not sleep on upholstered furniture (eg, couches).   5. If possible removing carpeting, upholstered furniture and drapery from the home is ideal.  Horizontal blinds should be eliminated in the rooms where the person spends the most time (bedroom, study, television room).  Washable vinyl, roller-type shades are optimal. 6. Remove all non-washable stuffed toys from the bedroom.  Wash stuffed toys weekly like sheets and blankets above.   7. Reduce indoor humidity to less than 50%.  Inexpensive humidity monitors can be purchased at most hardware stores.  Do not use a humidifier as can make the problem worse and are not recommended.  Control of Dog or Cat Allergen  Avoidance is the best  way to manage a dog or cat allergy. If you have a dog or cat and are allergic to dog or cats, consider removing the dog or cat from the home. If you have a dog or cat but don't want to find it a new home, or if your family wants a pet even though someone in the household is allergic, here are some strategies that may help keep symptoms at bay:  1. Keep the pet out of your bedroom and restrict it to only a few rooms. Be advised that keeping the dog or cat in only one room will not limit the allergens to that room. 2. Don't pet, hug or kiss the dog or cat; if you do, wash your hands with soap and water. 3. High-efficiency particulate air (HEPA) cleaners run continuously in a bedroom or living room can reduce allergen levels  over time. 4. Regular use of a high-efficiency vacuum cleaner or a central vacuum can reduce allergen levels. 5. Giving your dog or cat a bath at least once a week can reduce airborne allergen.  Allergy Shots   Allergies are the result of a chain reaction that starts in the immune system. Your immune system controls how your body defends itself. For instance, if you have an allergy to pollen, your immune system identifies pollen as an invader or allergen. Your immune system overreacts by producing antibodies called Immunoglobulin E (IgE). These antibodies travel to cells that release chemicals, causing an allergic reaction.  The concept behind allergy immunotherapy, whether it is received in the form of shots or tablets, is that the immune system can be desensitized to specific allergens that trigger allergy symptoms. Although it requires time and patience, the payback can be long-term relief.  How Do Allergy Shots Work?  Allergy shots work much like a vaccine. Your body responds to injected amounts of a particular allergen given in increasing doses, eventually developing a resistance and tolerance to it. Allergy shots can lead to decreased, minimal or no allergy symptoms.  There generally are two phases: build-up and maintenance. Build-up often ranges from three to six months and involves receiving injections with increasing amounts of the allergens. The shots are typically given once or twice a week, though more rapid build-up schedules are sometimes used.  The maintenance phase begins when the most effective dose is reached. This dose is different for each person, depending on how allergic you are and your response to the build-up injections. Once the maintenance dose is reached, there are longer periods between injections, typically two to four weeks.  Occasionally doctors give cortisone-type shots that can temporarily reduce allergy symptoms. These types of shots are different and should not  be confused with allergy immunotherapy shots.  Who Can Be Treated with Allergy Shots?  Allergy shots may be a good treatment approach for people with allergic rhinitis (hay fever), allergic asthma, conjunctivitis (eye allergy) or stinging insect allergy.   Before deciding to begin allergy shots, you should consider:  . The length of allergy season and the severity of your symptoms . Whether medications and/or changes to your environment can control your symptoms . Your desire to avoid long-term medication use . Time: allergy immunotherapy requires a major time commitment . Cost: may vary depending on your insurance coverage  Allergy shots for children age 93 and older are effective and often well tolerated. They might prevent the onset of new allergen sensitivities or the progression to asthma.  Allergy shots are not started on patients who  are pregnant but can be continued on patients who become pregnant while receiving them. In some patients with other medical conditions or who take certain common medications, allergy shots may be of risk. It is important to mention other medications you talk to your allergist.   When Will I Feel Better?  Some may experience decreased allergy symptoms during the build-up phase. For others, it may take as long as 12 months on the maintenance dose. If there is no improvement after a year of maintenance, your allergist will discuss other treatment options with you.  If you aren't responding to allergy shots, it may be because there is not enough dose of the allergen in your vaccine or there are missing allergens that were not identified during your allergy testing. Other reasons could be that there are high levels of the allergen in your environment or major exposure to non-allergic triggers like tobacco smoke.  What Is the Length of Treatment?  Once the maintenance dose is reached, allergy shots are generally continued for three to five years. The  decision to stop should be discussed with your allergist at that time. Some people may experience a permanent reduction of allergy symptoms. Others may relapse and a longer course of allergy shots can be considered.  What Are the Possible Reactions?  The two types of adverse reactions that can occur with allergy shots are local and systemic. Common local reactions include very mild redness and swelling at the injection site, which can happen immediately or several hours after. A systemic reaction, which is less common, affects the entire body or a particular body system. They are usually mild and typically respond quickly to medications. Signs include increased allergy symptoms such as sneezing, a stuffy nose or hives.  Rarely, a serious systemic reaction called anaphylaxis can develop. Symptoms include swelling in the throat, wheezing, a feeling of tightness in the chest, nausea or dizziness. Most serious systemic reactions develop within 30 minutes of allergy shots. This is why it is strongly recommended you wait in your doctor's office for 30 minutes after your injections. Your allergist is trained to watch for reactions, and his or her staff is trained and equipped with the proper medications to identify and treat them.  Who Should Administer Allergy Shots?  The preferred location for receiving shots is your prescribing allergist's office. Injections can sometimes be given at another facility where the physician and staff are trained to recognize and treat reactions, and have received instructions by your prescribing allergist.

## 2019-01-21 DIAGNOSIS — I671 Cerebral aneurysm, nonruptured: Secondary | ICD-10-CM | POA: Diagnosis not present

## 2019-01-21 DIAGNOSIS — Z6828 Body mass index (BMI) 28.0-28.9, adult: Secondary | ICD-10-CM | POA: Diagnosis not present

## 2019-01-21 DIAGNOSIS — I10 Essential (primary) hypertension: Secondary | ICD-10-CM | POA: Diagnosis not present

## 2019-03-19 ENCOUNTER — Encounter: Payer: Self-pay | Admitting: Allergy & Immunology

## 2019-03-19 ENCOUNTER — Other Ambulatory Visit: Payer: Self-pay

## 2019-03-19 ENCOUNTER — Ambulatory Visit (INDEPENDENT_AMBULATORY_CARE_PROVIDER_SITE_OTHER): Payer: Medicare HMO | Admitting: Allergy & Immunology

## 2019-03-19 VITALS — BP 118/66 | HR 92 | Temp 97.4°F | Resp 18

## 2019-03-19 DIAGNOSIS — J3089 Other allergic rhinitis: Secondary | ICD-10-CM

## 2019-03-19 DIAGNOSIS — R0602 Shortness of breath: Secondary | ICD-10-CM

## 2019-03-19 DIAGNOSIS — J302 Other seasonal allergic rhinitis: Secondary | ICD-10-CM

## 2019-03-19 NOTE — Progress Notes (Signed)
FOLLOW UP  Date of Service/Encounter:  03/19/19   Assessment:   SOB (shortness of breath)  Perennial and seasonal allergic rhinitis   Disabled status - due to history of anxiety/depression and aneurysms   Adverse social situation   Plan/Recommendations:   1. SOB (shortness of breath)  - This is clearly situational and has improved since moving. - I do not think that we need breathing tests today.  2. Chronic rhinitis (grasses, ragweed, weeds, trees, indoor molds, outdoor molds, dust mites and dog) - I am glad that you are doing so well. - Continue with: Zyrtec (cetirizine) 10mg  tablet 1-2 times daily AS NEEDED and Flonase (fluticasone) two sprays per nostril once daily AS NEEDED.  3. Return if symptoms worsen or fail to improve. This can be an in-person, a virtual Webex or a telephone follow up visit.  Subjective:   Crystal Stafford is a 59 y.o. female presenting today for follow up of  Chief Complaint  Patient presents with  . Allergies    Crystal Stafford has a history of the following: Patient Active Problem List   Diagnosis Date Noted  . Cerebral aneurysm, nonruptured 12/09/2016  . Aneurysm ([place]) 09/29/2016  . 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 Hospital discharge follow-up 10/23/2014  . PICC (peripherally inserted central catheter) removal 10/23/2014  . Sepsis ([place])   . Leukocytosis 10/10/2014  . SIRS (systemic inflammatory response syndrome) ([place]) 10/10/2014  . Impaired glucose tolerance 10/10/2014  . Myalgia   . Essential hypertension 10/21/2013  . Midline low back pain with left-sided sciatica 10/21/2013  . Breast cancer screening 10/21/2013  . Gastroesophageal reflux disease without esophagitis 10/21/2013  . Encounter for screening mammogram for malignant neoplasm of breast 10/21/2013  . Numbness and tingling 05/20/2013  . Shoulder pain, right 02/18/2013  . Morbid obesity ([place]) 02/18/2013  . Physical exam, routine 10/25/2012  .  Allergy 10/25/2012  . Hypertension 04/06/2012  . Prediabetes 04/06/2012  . Peptic ulcer 04/06/2012  . INSOMNIA 02/04/2010  . SEBACEOUS CYST, SCALP 08/21/2009  . HYPERLIPIDEMIA 06/02/2009  . HEADACHE 06/02/2009  . DEPRESSION 05/19/2009  . FATIGUE 05/19/2009  . 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 TOBACCO ABUSE 11/24/2006  . PELVIC PAIN, RIGHT 11/24/2006  . ASTHMA 11/10/2005    History obtained from: chart review and patient.  Crystal Stafford is a 59 y.o. female presenting for a follow up visit.  She was last seen in December 2021.  At that time, she was endorsing shortness of breath.  Her lung testing looked great.  We did not feel this was related to asthma.  She had testing that was positive to grasses, ragweed, weeds, trees, indoor and outdoor molds, dust mite, and dogs.  We started her on cetirizine and Flonase.  Since last visit, she has done well. She did move to another place. She is not having symptoms at all.  She has temporarily moved to [place] to live with her son.  He has none of the mold that she is, so her symptoms resolved completely.  She was able to get out of her lease without breaking it.  She is looking forward to moving into somewhere else and she wants to move back to [place] because [place]  Despite all of her sensitizations, she really has no issues.  Her allergic rhinitis did not become an issue until she was living in her previous apartment with a mold.  She is not using  her cetirizine or fluticasone at all.  While she has enjoyed spending more time with her grandkids (she has 6), she is desperate to get her own space again.  Otherwise, there have been no changes to her past medical history, surgical history, family history, or social history.    Review of Systems  Constitutional: Negative.  Negative for chills, fever, malaise/fatigue and weight loss.  HENT: Negative.  Negative for congestion, ear discharge,  ear pain, sinus pain and sore throat.   Eyes: Negative for pain, discharge and redness.  Respiratory: Negative for cough, sputum production, shortness of breath and wheezing.   Cardiovascular: Negative.  Negative for chest pain and palpitations.  Gastrointestinal: Negative for abdominal pain, constipation, diarrhea, heartburn, nausea and vomiting.  Skin: Negative.  Negative for itching and rash.  Neurological: Negative for dizziness and headaches.  Endo/Heme/Allergies: Negative for environmental allergies. Does not bruise/bleed easily.  Psychiatric/Behavioral: Positive for hallucinations.       Objective:   Blood pressure 118/66, pulse 92, temperature (!) 97.4 F (36.3 C), temperature source Temporal, resp. rate 18, SpO2 98 %. There is no height or weight on file to calculate BMI.   Physical Exam:  Physical Exam  Constitutional: She appears well-developed.  High-energy boisterous female.  HENT:  Head: Normocephalic and atraumatic.  Right Ear: Tympanic membrane, external ear and ear canal normal.  Left Ear: Tympanic membrane, external ear and ear canal normal.  Nose: Mucosal edema present. No rhinorrhea, nasal deformity or septal deviation. No epistaxis. Right sinus exhibits no maxillary sinus tenderness and no frontal sinus tenderness. Left sinus exhibits no maxillary sinus tenderness and no frontal sinus tenderness.  Mouth/Throat: Uvula is midline and oropharynx is clear and moist. Mucous membranes are not pale and not dry.  Eyes: Pupils are equal, round, and reactive to light. Conjunctivae and EOM are normal. Right eye exhibits no chemosis and no discharge. Left eye exhibits no chemosis and no discharge. Right conjunctiva is not injected. Left conjunctiva is not injected.  Cardiovascular: Normal rate, regular rhythm and normal heart sounds.  Respiratory: Effort normal and breath sounds normal. No accessory muscle usage. No tachypnea. No respiratory distress. She has no wheezes. She  has no rhonchi. She has no rales. She exhibits no tenderness.  Moving air well in all lung fields.  Lymphadenopathy:    She has no cervical adenopathy.  Neurological: She is alert.  Skin: No abrasion, no petechiae and no rash noted. Rash is not papular, not vesicular and not urticarial. No erythema. No pallor.  Psychiatric: She has a normal mood and affect.     Diagnostic studies: none      Salvatore Marvel, MD  [facility]

## 2019-03-19 NOTE — Patient Instructions (Addendum)
1. SOB (shortness of breath)  - This is clearly situational and has improved since moving. - I do not think that we need breathing tests today.  2. Chronic rhinitis (grasses, ragweed, weeds, trees, indoor molds, outdoor molds, dust mites and dog) - I am glad that you are doing so well. - Continue with: Zyrtec (cetirizine) 10mg  tablet 1-2 times daily AS NEEDED and Flonase (fluticasone) two sprays per nostril once daily AS NEEDED.  3. Return if symptoms worsen or fail to improve. This can be an in-person, a virtual Webex or a telephone follow up visit.   Please inform us of any Emergency Department visits, hospitalizations, or changes in symptoms. Call us before going to the ED for breathing or allergy symptoms since we might be able to fit you in for a sick visit. Feel free to contact us anytime with any questions, problems, or concerns.  It was a pleasure to see you again today!  Websites that have reliable patient information: 1. American Academy of Asthma, Allergy, and Immunology: www.aaaai.org 2. Food Allergy Research and Education (FARE): foodallergy.org 3. Mothers of Asthmatics: http://www.asthmacommunitynetwork.org 4. American College of Allergy, Asthma, and Immunology: www.acaai.org   COVID-19 Vaccine Information can be found at: [email or web address] For questions related to vaccine distribution or appointments, please email [email or web address] or call [phone].     "Like" [place] on Facebook and Instagram for our latest updates!        Make sure you are registered to vote! If you have moved or changed any of your contact information, you will need to get this updated before voting!  In some cases, you MAY be able to register to vote online: [email or web address]

## 2019-04-05 ENCOUNTER — Ambulatory Visit: Payer: Medicare HMO | Attending: Internal Medicine

## 2019-04-05 DIAGNOSIS — Z23 Encounter for immunization: Secondary | ICD-10-CM

## 2019-04-05 NOTE — Progress Notes (Signed)
   Covid-19 Vaccination Clinic  Name:  Crystal Stafford    MRN: [id number] DOB: Sep 23, 1960  04/05/2019  [name] was observed post Covid-19 immunization for 15 minutes without incident. She was provided with Vaccine Information Sheet and instruction to access the V-Safe system.   [name] was instructed to call 911 with any severe reactions post vaccine: Marland Kitchen Difficulty breathing  . Swelling of face and throat  . A fast heartbeat  . A bad rash all over body  . Dizziness and weakness   Immunizations Administered    Name Date Dose VIS Date Route   Pfizer COVID-19 Vaccine 04/05/2019  1:48 PM 0.3 mL 12/21/2018 Intramuscular   Manufacturer: [place]   Lot: [id number]   [place]: [id number]

## 2019-04-24 DIAGNOSIS — E785 Hyperlipidemia, unspecified: Secondary | ICD-10-CM | POA: Diagnosis not present

## 2019-04-24 DIAGNOSIS — I10 Essential (primary) hypertension: Secondary | ICD-10-CM | POA: Diagnosis not present

## 2019-04-24 DIAGNOSIS — F331 Major depressive disorder, recurrent, moderate: Secondary | ICD-10-CM | POA: Diagnosis not present

## 2019-04-24 DIAGNOSIS — E1169 Type 2 diabetes mellitus with other specified complication: Secondary | ICD-10-CM | POA: Diagnosis not present

## 2019-05-01 ENCOUNTER — Ambulatory Visit: Payer: Medicare HMO | Attending: Internal Medicine

## 2019-05-01 DIAGNOSIS — Z23 Encounter for immunization: Secondary | ICD-10-CM

## 2019-05-01 NOTE — Progress Notes (Signed)
   Covid-19 Vaccination Clinic  Name:  Crystal Stafford    MRN: [id number] DOB: 10/08/1960  05/01/2019  [name] was observed post Covid-19 immunization for 15 minutes without incident. She was provided with Vaccine Information Sheet and instruction to access the V-Safe system.   [name] was instructed to call 911 with any severe reactions post vaccine: Marland Kitchen Difficulty breathing  . Swelling of face and throat  . A fast heartbeat  . A bad rash all over body  . Dizziness and weakness   Immunizations Administered    Name Date Dose VIS Date Route   Pfizer COVID-19 Vaccine 05/01/2019  1:57 PM 0.3 mL 03/06/2018 Intramuscular   Manufacturer: [place]   Lot: [id number]   [place]: [id number]

## 2019-05-10 DIAGNOSIS — I10 Essential (primary) hypertension: Secondary | ICD-10-CM | POA: Diagnosis not present

## 2019-05-16 DIAGNOSIS — F331 Major depressive disorder, recurrent, moderate: Secondary | ICD-10-CM | POA: Diagnosis not present

## 2019-05-16 DIAGNOSIS — I10 Essential (primary) hypertension: Secondary | ICD-10-CM | POA: Diagnosis not present

## 2019-05-16 DIAGNOSIS — E785 Hyperlipidemia, unspecified: Secondary | ICD-10-CM | POA: Diagnosis not present

## 2019-05-16 DIAGNOSIS — E1169 Type 2 diabetes mellitus with other specified complication: Secondary | ICD-10-CM | POA: Diagnosis not present

## 2019-07-08 DIAGNOSIS — F331 Major depressive disorder, recurrent, moderate: Secondary | ICD-10-CM | POA: Diagnosis not present

## 2019-07-08 DIAGNOSIS — E785 Hyperlipidemia, unspecified: Secondary | ICD-10-CM | POA: Diagnosis not present

## 2019-07-08 DIAGNOSIS — I10 Essential (primary) hypertension: Secondary | ICD-10-CM | POA: Diagnosis not present

## 2019-07-08 DIAGNOSIS — E1169 Type 2 diabetes mellitus with other specified complication: Secondary | ICD-10-CM | POA: Diagnosis not present

## 2019-07-24 DIAGNOSIS — I10 Essential (primary) hypertension: Secondary | ICD-10-CM | POA: Diagnosis not present

## 2019-07-24 DIAGNOSIS — E785 Hyperlipidemia, unspecified: Secondary | ICD-10-CM | POA: Diagnosis not present

## 2019-07-24 DIAGNOSIS — F331 Major depressive disorder, recurrent, moderate: Secondary | ICD-10-CM | POA: Diagnosis not present

## 2019-07-24 DIAGNOSIS — E1169 Type 2 diabetes mellitus with other specified complication: Secondary | ICD-10-CM | POA: Diagnosis not present

## 2019-07-30 DIAGNOSIS — E119 Type 2 diabetes mellitus without complications: Secondary | ICD-10-CM | POA: Diagnosis not present

## 2019-07-30 DIAGNOSIS — H2513 Age-related nuclear cataract, bilateral: Secondary | ICD-10-CM | POA: Diagnosis not present

## 2019-07-30 DIAGNOSIS — H5203 Hypermetropia, bilateral: Secondary | ICD-10-CM | POA: Diagnosis not present

## 2019-07-30 DIAGNOSIS — H524 Presbyopia: Secondary | ICD-10-CM | POA: Diagnosis not present

## 2019-08-24 ENCOUNTER — Other Ambulatory Visit: Payer: Self-pay

## 2019-08-24 ENCOUNTER — Encounter (HOSPITAL_COMMUNITY): Payer: Self-pay

## 2019-08-24 ENCOUNTER — Ambulatory Visit (HOSPITAL_COMMUNITY)
Admission: EM | Admit: 2019-08-24 | Discharge: 2019-08-24 | Disposition: A | Discharge status: Home / Self Care | Payer: Medicare HMO | Attending: Family Medicine | Admitting: Family Medicine

## 2019-08-24 DIAGNOSIS — I10 Essential (primary) hypertension: Secondary | ICD-10-CM

## 2019-08-24 DIAGNOSIS — R3915 Urgency of urination: Secondary | ICD-10-CM

## 2019-08-24 DIAGNOSIS — R42 Dizziness and giddiness: Secondary | ICD-10-CM | POA: Diagnosis not present

## 2019-08-24 DIAGNOSIS — R5381 Other malaise: Secondary | ICD-10-CM

## 2019-08-24 DIAGNOSIS — F17200 Nicotine dependence, unspecified, uncomplicated: Secondary | ICD-10-CM

## 2019-08-24 DIAGNOSIS — E119 Type 2 diabetes mellitus without complications: Secondary | ICD-10-CM

## 2019-08-24 DIAGNOSIS — R350 Frequency of micturition: Secondary | ICD-10-CM

## 2019-08-24 DIAGNOSIS — R109 Unspecified abdominal pain: Secondary | ICD-10-CM

## 2019-08-24 DIAGNOSIS — R197 Diarrhea, unspecified: Secondary | ICD-10-CM | POA: Diagnosis not present

## 2019-08-24 DIAGNOSIS — R1084 Generalized abdominal pain: Secondary | ICD-10-CM

## 2019-08-24 DIAGNOSIS — E860 Dehydration: Secondary | ICD-10-CM

## 2019-08-24 LAB — CBG MONITORING, ED: Glucose-Capillary: 93 mg/dL (ref 70–99)

## 2019-08-24 NOTE — Discharge Instructions (Signed)
Please just use Tylenol at a dose of 500mg-650mg once every 6 hours as needed for your aches, pains, fevers. Do not use any nonsteroidal anti-inflammatories (NSAIDs) like ibuprofen, Motrin, naproxen, Aleve, etc. which are all available over-the-counter.   

## 2019-08-24 NOTE — ED Triage Notes (Signed)
Pt presents lightheadedness, diarrhea, and generalized abdominal pain.  Pt is diabetic, has high blood pressure, and high cholesterol.  Pt is currently homeless.

## 2019-08-24 NOTE — ED Provider Notes (Signed)
[place]   MRN: [id number] DOB: 1960-09-05  Subjective:   Crystal Stafford is a 59 y.o. female presenting for 1 day history of generalized belly pain, lightheadedness, and a few episodes of diarrhea.  Has also noticed that she has had urinary frequency, urgency over the past month.  Patient states that she is compliant with her diabetes medications for medications for blood pressure and cholesterol as well.  Denies chest pain, shortness of breath, fevers, loss of sense of taste and smell, throat pain, confusion, headaches.  Has not tried medications for relief.  No current facility-administered medications for this encounter.  Current Outpatient Medications:  .  aspirin 325 MG tablet, Take 162 mg by mouth daily. , Disp: , Rfl:  .  atorvastatin (LIPITOR) 20 MG tablet, Take 1 tablet (20 mg total) by mouth daily., Disp: 30 tablet, Rfl: 2 .  cetirizine (ZYRTEC) 10 MG tablet, Take 1 tablet (10 mg total) by mouth 2 (two) times daily., Disp: 60 tablet, Rfl: 5 .  escitalopram (LEXAPRO) 10 MG tablet, , Disp: , Rfl:  .  fluticasone (FLONASE) 50 MCG/ACT nasal spray, Place 2 sprays into both nostrils daily., Disp: 16 g, Rfl: 5 .  hydrOXYzine (ATARAX/VISTARIL) 25 MG tablet, , Disp: , Rfl:  .  metFORMIN (GLUCOPHAGE-XR) 500 MG 24 hr tablet, TAKE 1 TABLET BY MOUTH ONCE DAILY WITH BREAKFAST, Disp: 90 tablet, Rfl: 0 .  montelukast (SINGULAIR) 10 MG tablet, , Disp: , Rfl:  .  omeprazole (PRILOSEC) 40 MG capsule, Take 40 mg by mouth daily., Disp: , Rfl:  .  valsartan (DIOVAN) 80 MG tablet, Take 80 mg by mouth daily., Disp: , Rfl:    Allergies  Allergen Reactions  . Doxycycline Shortness Of Breath, Swelling and Rash    Face swelling  . Doxycycline Rash  . Tape Itching and Rash    Past Medical History:  Diagnosis Date  . Allergy   . Anxiety   . Arthritis   . Depression   . Diabetes mellitus without complication ([place])   . GERD (gastroesophageal reflux disease)   . Headache   .  Hyperlipidemia   . Hypertension   . MRSA (methicillin resistant Staphylococcus aureus)   . Pre-diabetes      Past Surgical History:  Procedure Laterality Date  . ABDOMINAL HYSTERECTOMY    . COLONOSCOPY    . IR ANGIO INTRA EXTRACRAN SEL COM CAROTID INNOMINATE UNI R MOD SED  12/12/2017  . IR ANGIO INTRA EXTRACRAN SEL INTERNAL CAROTID BILAT MOD SED  10/20/2016  . IR ANGIO INTRA EXTRACRAN SEL INTERNAL CAROTID BILAT MOD SED  12/09/2016  . IR ANGIO INTRA EXTRACRAN SEL INTERNAL CAROTID BILAT MOD SED  06/16/2017  . IR ANGIO INTRA EXTRACRAN SEL INTERNAL CAROTID UNI L MOD SED  12/12/2017  . IR ANGIO VERTEBRAL SEL VERTEBRAL BILAT MOD SED  10/20/2016  . IR ANGIO VERTEBRAL SEL VERTEBRAL BILAT MOD SED  06/16/2017  . IR ANGIO VERTEBRAL SEL VERTEBRAL UNI R MOD SED  12/12/2017  . IR ANGIOGRAM FOLLOW UP STUDY  12/09/2016  . IR RADIOLOGY PERIPHERAL GUIDED IV START  12/12/2017  . IR TRANSCATH/EMBOLIZ  12/09/2016  . IR US GUIDE VASC ACCESS RIGHT  06/16/2017  . IR US GUIDE VASC ACCESS RIGHT  12/12/2017  . RADIOLOGY WITH ANESTHESIA N/A 12/09/2016   Procedure: Pipeline embolization of aneurysm and possible coiling;  Surgeon: Consuella Lose, MD;  Location: [facility];  Service: Radiology;  Laterality: N/A;    Family History  Problem Relation Age of  Onset  . Colon cancer Maternal Grandfather 56  . Heart disease Mother   . Breast cancer Maternal Aunt   . Hypertension Other   . Diabetes Other   . Esophageal cancer Neg Hx   . Stomach cancer Neg Hx   . Rectal cancer Neg Hx     Social History   Tobacco Use  . Smoking status: Current Every Day Smoker    Packs/day: 0.50    Years: 20.00    Pack years: 10.00    Types: Cigarettes  . Smokeless tobacco: Never Used  . Tobacco comment: tobacco info given 02/08/2018  Vaping Use  . Vaping Use: Never used  Substance Use Topics  . Alcohol use: Never  . Drug use: Never    ROS   Objective:   Vitals: BP 138/84 (BP Location: Right Arm)   Pulse 86   Temp 98.5 F  (36.9 C) (Oral)   Resp 17   SpO2 97%   Physical Exam Constitutional:      General: She is not in acute distress.    Appearance: Normal appearance. She is well-developed and normal weight. She is not ill-appearing, toxic-appearing or diaphoretic.  HENT:     Head: Normocephalic and atraumatic.     Right Ear: External ear normal.     Left Ear: External ear normal.     Nose: Nose normal.     Mouth/Throat:     Mouth: Mucous membranes are moist.     Pharynx: Oropharynx is clear.  Eyes:     General: No scleral icterus.       Right eye: No discharge.        Left eye: No discharge.     Extraocular Movements: Extraocular movements intact.     Conjunctiva/sclera: Conjunctivae normal.     Pupils: Pupils are equal, round, and reactive to light.  Cardiovascular:     Rate and Rhythm: Normal rate and regular rhythm.     Heart sounds: Normal heart sounds. No murmur heard.  No friction rub. No gallop.   Pulmonary:     Effort: Pulmonary effort is normal. No respiratory distress.     Breath sounds: Normal breath sounds. No stridor. No wheezing, rhonchi or rales.  Abdominal:     General: Bowel sounds are normal. There is no distension.     Palpations: Abdomen is soft. There is no mass.     Tenderness: There is no abdominal tenderness. There is no right CVA tenderness, left CVA tenderness, guarding or rebound.  Skin:    General: Skin is warm and dry.     Coloration: Skin is not pale.     Findings: No rash.  Neurological:     General: No focal deficit present.     Mental Status: She is alert and oriented to person, place, and time.     Cranial Nerves: No cranial nerve deficit.     Motor: No weakness.     Coordination: Coordination normal.     Gait: Gait normal.     Deep Tendon Reflexes: Reflexes normal.  Psychiatric:        Mood and Affect: Mood normal.        Behavior: Behavior normal.        Thought Content: Thought content normal.        Judgment: Judgment normal.     Results for  orders placed or performed during the hospital encounter of 08/24/19 (from the past 24 hour(s))  POC CBG monitoring     Status:  None   Collection Time: 08/24/19  3:12 PM  Result Value Ref Range   Glucose-Capillary 93 70 - 99 mg/dL    Assessment and Plan :   PDMP not reviewed this encounter.  1. Diarrhea, unspecified type   2. Generalized abdominal pain   3. Malaise   4. Urinary frequency   5. Urinary urgency   6. Type 2 diabetes mellitus without complication, unspecified whether long term insulin use ([place])   7. Essential hypertension   8. Smoker     Patient has an appointment with her PCP coming up soon.  At discharge, she admits that she has not eaten today, has not drank any fluids.  States that she is really bad about hydrating.  She was unable to provide a urine sample here in the clinic, states that she has not had to use the restroom.  Therefore I canceled the urinalysis.  Emphasized need to hydrate better, eat regular meals.  Recommended supportive care otherwise, Tylenol for aches and pains.  Maintain follow-up with PCP. Counseled patient on potential for adverse effects with medications prescribed/recommended today, ER and return-to-clinic precautions discussed, patient verbalized understanding.    Jaynee Eagles, PA-C 08/24/19 1731

## 2019-08-29 DIAGNOSIS — Z01419 Encounter for gynecological examination (general) (routine) without abnormal findings: Secondary | ICD-10-CM | POA: Diagnosis not present

## 2019-08-29 DIAGNOSIS — N3941 Urge incontinence: Secondary | ICD-10-CM | POA: Diagnosis not present

## 2019-08-29 DIAGNOSIS — R3915 Urgency of urination: Secondary | ICD-10-CM | POA: Diagnosis not present

## 2019-08-29 DIAGNOSIS — Z113 Encounter for screening for infections with a predominantly sexual mode of transmission: Secondary | ICD-10-CM | POA: Diagnosis not present

## 2019-08-29 DIAGNOSIS — Z1231 Encounter for screening mammogram for malignant neoplasm of breast: Secondary | ICD-10-CM | POA: Diagnosis not present

## 2019-08-29 DIAGNOSIS — N952 Postmenopausal atrophic vaginitis: Secondary | ICD-10-CM | POA: Diagnosis not present

## 2019-08-30 DIAGNOSIS — E785 Hyperlipidemia, unspecified: Secondary | ICD-10-CM | POA: Diagnosis not present

## 2019-08-30 DIAGNOSIS — E1169 Type 2 diabetes mellitus with other specified complication: Secondary | ICD-10-CM | POA: Diagnosis not present

## 2019-08-30 DIAGNOSIS — F331 Major depressive disorder, recurrent, moderate: Secondary | ICD-10-CM | POA: Diagnosis not present

## 2019-08-30 DIAGNOSIS — I10 Essential (primary) hypertension: Secondary | ICD-10-CM | POA: Diagnosis not present

## 2019-09-20 DIAGNOSIS — F331 Major depressive disorder, recurrent, moderate: Secondary | ICD-10-CM | POA: Diagnosis not present

## 2019-09-20 DIAGNOSIS — F17200 Nicotine dependence, unspecified, uncomplicated: Secondary | ICD-10-CM | POA: Diagnosis not present

## 2019-09-20 DIAGNOSIS — I10 Essential (primary) hypertension: Secondary | ICD-10-CM | POA: Diagnosis not present

## 2019-09-20 DIAGNOSIS — E785 Hyperlipidemia, unspecified: Secondary | ICD-10-CM | POA: Diagnosis not present

## 2019-09-20 DIAGNOSIS — E1169 Type 2 diabetes mellitus with other specified complication: Secondary | ICD-10-CM | POA: Diagnosis not present

## 2019-09-30 DIAGNOSIS — E1169 Type 2 diabetes mellitus with other specified complication: Secondary | ICD-10-CM | POA: Diagnosis not present

## 2019-09-30 DIAGNOSIS — I10 Essential (primary) hypertension: Secondary | ICD-10-CM | POA: Diagnosis not present

## 2019-09-30 DIAGNOSIS — E785 Hyperlipidemia, unspecified: Secondary | ICD-10-CM | POA: Diagnosis not present

## 2019-09-30 DIAGNOSIS — F331 Major depressive disorder, recurrent, moderate: Secondary | ICD-10-CM | POA: Diagnosis not present

## 2019-10-25 DIAGNOSIS — I10 Essential (primary) hypertension: Secondary | ICD-10-CM | POA: Diagnosis not present

## 2019-10-25 DIAGNOSIS — E785 Hyperlipidemia, unspecified: Secondary | ICD-10-CM | POA: Diagnosis not present

## 2019-10-25 DIAGNOSIS — E1169 Type 2 diabetes mellitus with other specified complication: Secondary | ICD-10-CM | POA: Diagnosis not present

## 2019-10-30 DIAGNOSIS — F17200 Nicotine dependence, unspecified, uncomplicated: Secondary | ICD-10-CM | POA: Diagnosis not present

## 2019-10-30 DIAGNOSIS — G2581 Restless legs syndrome: Secondary | ICD-10-CM | POA: Diagnosis not present

## 2019-10-30 DIAGNOSIS — Z23 Encounter for immunization: Secondary | ICD-10-CM | POA: Diagnosis not present

## 2019-10-30 DIAGNOSIS — F331 Major depressive disorder, recurrent, moderate: Secondary | ICD-10-CM | POA: Diagnosis not present

## 2019-11-24 ENCOUNTER — Ambulatory Visit (INDEPENDENT_AMBULATORY_CARE_PROVIDER_SITE_OTHER): Payer: Medicare HMO

## 2019-11-24 ENCOUNTER — Ambulatory Visit (HOSPITAL_COMMUNITY)
Admission: EM | Admit: 2019-11-24 | Discharge: 2019-11-24 | Disposition: A | Discharge status: Home / Self Care | Payer: Medicare HMO | Attending: Internal Medicine | Admitting: Internal Medicine

## 2019-11-24 ENCOUNTER — Other Ambulatory Visit: Payer: Self-pay

## 2019-11-24 ENCOUNTER — Encounter (HOSPITAL_COMMUNITY): Payer: Self-pay | Admitting: *Deleted

## 2019-11-24 DIAGNOSIS — M25559 Pain in unspecified hip: Secondary | ICD-10-CM | POA: Diagnosis not present

## 2019-11-24 DIAGNOSIS — M25551 Pain in right hip: Secondary | ICD-10-CM | POA: Diagnosis not present

## 2019-11-24 MED ORDER — KETOROLAC TROMETHAMINE 60 MG/2ML IM SOLN
INTRAMUSCULAR | Status: AC
  Filled 2019-11-24: qty 2

## 2019-11-24 MED ORDER — KETOROLAC TROMETHAMINE 60 MG/2ML IM SOLN
60.0000 mg | Freq: Once | INTRAMUSCULAR | Status: AC
  Administered 2019-11-24: 60 mg via INTRAMUSCULAR

## 2019-11-24 MED ORDER — MELOXICAM 15 MG PO TABS: 15.0000 mg | ORAL_TABLET | Freq: Every day | ORAL | 0 refills | Status: DC

## 2019-11-24 NOTE — Discharge Instructions (Signed)
I am hopeful that the medication I give you tonight is helpful with your pain.  If worsening of pain or no improvement at please follow up in the ER or with orthopedics.  You can also start meloxicam daily. Take with food and don't take additional ibuprofen.

## 2019-11-24 NOTE — ED Provider Notes (Signed)
Crystal Stafford    CSN: [id number] Arrival date & time: 11/24/19  1423      History   Chief Complaint Chief Complaint  Patient presents with  . Groin Pain  . Chest Pain    HPI Crystal Stafford is a 59 y.o. female.   Crystal Stafford presents with complaints of right hip pain which started yesterday, no known trigger or injury. Pain to right groin region. She works at a [employer]. No known injury. Worse today. Hip flexion is painful, such as bending forward or raising her leg up.  No numbness or tingling. No urinary symptoms. No back pain. History of back pain, however. Denies any recent steroids. History of lumbar radiculopathy, per chart review as well as left hip pain. Also notes that today she had approximately 2 minutes of chest pain while driving. No current chest pain. No jaw or arm pain. No diaphoresis. No nausea. Hasn't taken anything for pain.    ROS per HPI, negative if not otherwise mentioned.      Past Medical History:  Diagnosis Date  . Allergy   . Anxiety   . Arthritis   . Depression   . Diabetes mellitus without complication ([place])   . GERD (gastroesophageal reflux disease)   . Headache   . Hyperlipidemia   . Hypertension   . MRSA (methicillin resistant Staphylococcus aureus)   . Pre-diabetes     Patient Active Problem List   Diagnosis Date Noted  . Cerebral aneurysm, nonruptured 12/09/2016  . Aneurysm ([place]) 09/29/2016  . 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 Hospital discharge follow-up 10/23/2014  . PICC (peripherally inserted central catheter) removal 10/23/2014  . Sepsis ([place])   . Leukocytosis 10/10/2014  . SIRS (systemic inflammatory response syndrome) ([place]) 10/10/2014  . Impaired glucose tolerance 10/10/2014  . Myalgia   . Essential hypertension 10/21/2013  . Midline low back pain with left-sided sciatica 10/21/2013  . Breast cancer screening 10/21/2013  . Gastroesophageal reflux disease without  esophagitis 10/21/2013  . Encounter for screening mammogram for malignant neoplasm of breast 10/21/2013  . Numbness and tingling 05/20/2013  . Shoulder pain, right 02/18/2013  . Morbid obesity ([place]) 02/18/2013  . Physical exam, routine 10/25/2012  . Allergy 10/25/2012  . Hypertension 04/06/2012  . Prediabetes 04/06/2012  . Peptic ulcer 04/06/2012  . INSOMNIA 02/04/2010  . SEBACEOUS CYST, SCALP 08/21/2009  . HYPERLIPIDEMIA 06/02/2009  . HEADACHE 06/02/2009  . DEPRESSION 05/19/2009  . FATIGUE 05/19/2009  . 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 TOBACCO ABUSE 11/24/2006  . PELVIC PAIN, RIGHT 11/24/2006  . ASTHMA 11/10/2005    Past Surgical History:  Procedure Laterality Date  . ABDOMINAL HYSTERECTOMY    . COLONOSCOPY    . IR ANGIO INTRA EXTRACRAN SEL COM CAROTID INNOMINATE UNI R MOD SED  12/12/2017  . IR ANGIO INTRA EXTRACRAN SEL INTERNAL CAROTID BILAT MOD SED  10/20/2016  . IR ANGIO INTRA EXTRACRAN SEL INTERNAL CAROTID BILAT MOD SED  12/09/2016  . IR ANGIO INTRA EXTRACRAN SEL INTERNAL CAROTID BILAT MOD SED  06/16/2017  . IR ANGIO INTRA EXTRACRAN SEL INTERNAL CAROTID UNI L MOD SED  12/12/2017  . IR ANGIO VERTEBRAL SEL VERTEBRAL BILAT MOD SED  10/20/2016  . IR ANGIO VERTEBRAL SEL VERTEBRAL BILAT MOD SED  06/16/2017  . IR ANGIO VERTEBRAL SEL VERTEBRAL UNI R MOD SED  12/12/2017  . IR ANGIOGRAM FOLLOW UP STUDY  12/09/2016  . IR RADIOLOGY PERIPHERAL GUIDED IV START  12/12/2017  .  IR TRANSCATH/EMBOLIZ  12/09/2016  . IR US GUIDE VASC ACCESS RIGHT  06/16/2017  . IR US GUIDE VASC ACCESS RIGHT  12/12/2017  . RADIOLOGY WITH ANESTHESIA N/A 12/09/2016   Procedure: Pipeline embolization of aneurysm and possible coiling;  Surgeon: Consuella Lose, MD;  Location: [facility];  Service: Radiology;  Laterality: N/A;    OB History   No obstetric history on file.      Home Medications    Prior to Admission medications   Medication Sig Start Date End Date Taking? Authorizing Provider    atorvastatin (LIPITOR) 20 MG tablet Take 1 tablet (20 mg total) by mouth daily. 11/30/16  Yes Alfonse Spruce, FNP  escitalopram (LEXAPRO) 10 MG tablet  12/20/18  Yes [provider]  metFORMIN (GLUCOPHAGE-XR) 500 MG 24 hr tablet TAKE 1 TABLET BY MOUTH ONCE DAILY WITH BREAKFAST 02/01/17  Yes Hairston, Mandesia R, FNP  valsartan (DIOVAN) 80 MG tablet Take 80 mg by mouth daily.   Yes [provider]  aspirin 325 MG tablet Take 162 mg by mouth daily.     [provider]  cetirizine (ZYRTEC) 10 MG tablet Take 1 tablet (10 mg total) by mouth 2 (two) times daily. 01/17/19   Valentina Shaggy, MD  fluticasone [facility]) 50 MCG/ACT nasal spray Place 2 sprays into both nostrils daily. 01/17/19   Valentina Shaggy, MD  hydrOXYzine (ATARAX/VISTARIL) 25 MG tablet  12/20/18   [provider]  meloxicam (MOBIC) 15 MG tablet Take 1 tablet (15 mg total) by mouth daily. 11/24/19   Zigmund Gottron, NP  montelukast (SINGULAIR) 10 MG tablet  12/20/18   [provider]  omeprazole (PRILOSEC) 40 MG capsule Take 40 mg by mouth daily.    [provider]    Family History Family History  Problem Relation Age of Onset  . Colon cancer Maternal Grandfather 59  . Heart disease Mother   . Breast cancer Maternal Aunt   . Hypertension Other   . Diabetes Other   . Esophageal cancer Neg Hx   . Stomach cancer Neg Hx   . Rectal cancer Neg Hx     Social History Social History   Tobacco Use  . Smoking status: Current Every Day Smoker    Packs/day: 0.50    Years: 20.00    Pack years: 10.00    Types: Cigarettes  . Smokeless tobacco: Never Used  . Tobacco comment: tobacco info given 02/08/2018  Vaping Use  . Vaping Use: Never used  Substance Use Topics  . Alcohol use: Never  . Drug use: Never     Allergies   Doxycycline, Doxycycline, and Tape   Review of Systems Review of Systems   Physical Exam Triage Vital Signs ED Triage Vitals  Enc  Vitals Group     BP 11/24/19 1443 131/83     Pulse Rate 11/24/19 1443 (!) 101     Resp 11/24/19 1443 18     Temp 11/24/19 1443 99 F (37.2 C)     Temp Source 11/24/19 1443 Oral     SpO2 11/24/19 1443 98 %     Weight --      Height 11/24/19 1446 5\' 4"  (1.626 m)     Head Circumference --      Peak Flow --      Pain Score 11/24/19 1445 4     Pain Loc --      Pain Edu? --      Excl. in [place]? --  No data found.  Updated Vital Signs BP 131/83 (BP Location: Right Arm)   Pulse (!) 101   Temp 99 F (37.2 C) (Oral)   Resp 18   Ht 5\' 4"  (1.626 m)   SpO2 98%   BMI 28.05 kg/m      Physical Exam Constitutional:      General: She is not in acute distress.    Appearance: She is well-developed.  Cardiovascular:     Rate and Rhythm: Normal rate.     Heart sounds: Normal heart sounds.  Pulmonary:     Effort: Pulmonary effort is normal.     Breath sounds: Normal breath sounds.  Musculoskeletal:     Right hip: Tenderness and bony tenderness present.     Comments: Significant right hip pain, worse with adduction, with log rolling of femur, pain with hip flexion; noted pain even with transition from sit to lay; no back tenderness; gross sensation intact   Skin:    General: Skin is warm and dry.  Neurological:     Mental Status: She is alert and oriented to person, place, and time.    EKG:  NSR rate of 93. Previous EKG was available for review. No stwave changes as interpreted by me.    Xray reviewed by myself as system down unable for radiology to review at this time without any obvious acute findings, no obvious visible AVN, mild arthritis noted  UC Treatments / Results  Labs (all labs ordered are listed, but only abnormal results are displayed) Labs Reviewed - No data to display  EKG   Radiology No results found.  Procedures Procedures (including critical care time)  Medications Ordered in UC Medications  ketorolac (TORADOL) injection 60 mg (60 mg Intramuscular Given  11/24/19 1628)    Initial Impression / Assessment and Plan / UC Course  I have reviewed the triage vital signs and the nursing notes.  Pertinent labs & imaging results that were available during my care of the patient were reviewed by me and considered in my medical decision making (see chart for details).     No trauma, pain onset yesterday, no recent steroid. avn considered, without obvious indication on xray. Will trial toradol here tonight with nsaids provided, strict ER and/or return precautions discussed and provided. Follow up recommendations provided. Patient verbalized understanding and agreeable to plan.   Final Clinical Impressions(s) / UC Diagnoses   Final diagnoses:  Hip pain  Right hip pain     Discharge Instructions     I am hopeful that the medication I give you tonight is helpful with your pain.  If worsening of pain or no improvement at please follow up in the ER or with orthopedics.  You can also start meloxicam daily. Take with food and don't take additional ibuprofen.     ED Prescriptions    Medication Sig Dispense Auth. Provider   meloxicam (MOBIC) 15 MG tablet Take 1 tablet (15 mg total) by mouth daily. 20 tablet Zigmund Gottron, NP     PDMP not reviewed this encounter.   Zigmund Gottron, NP 11/24/19 [phone]

## 2019-11-24 NOTE — ED Triage Notes (Signed)
PT reports yesterday left work early due to Rt groin pain . CP started today. Pt HX of DM,HTN .

## 2019-12-03 DIAGNOSIS — M2141 Flat foot [pes planus] (acquired), right foot: Secondary | ICD-10-CM | POA: Diagnosis not present

## 2019-12-03 DIAGNOSIS — Z5900 Homelessness unspecified: Secondary | ICD-10-CM | POA: Diagnosis not present

## 2019-12-03 DIAGNOSIS — E785 Hyperlipidemia, unspecified: Secondary | ICD-10-CM | POA: Diagnosis not present

## 2019-12-03 DIAGNOSIS — F331 Major depressive disorder, recurrent, moderate: Secondary | ICD-10-CM | POA: Diagnosis not present

## 2019-12-03 DIAGNOSIS — M25551 Pain in right hip: Secondary | ICD-10-CM | POA: Diagnosis not present

## 2019-12-03 DIAGNOSIS — E1169 Type 2 diabetes mellitus with other specified complication: Secondary | ICD-10-CM | POA: Diagnosis not present

## 2019-12-03 DIAGNOSIS — I10 Essential (primary) hypertension: Secondary | ICD-10-CM | POA: Diagnosis not present

## 2019-12-10 DIAGNOSIS — E1169 Type 2 diabetes mellitus with other specified complication: Secondary | ICD-10-CM | POA: Diagnosis not present

## 2019-12-10 DIAGNOSIS — I10 Essential (primary) hypertension: Secondary | ICD-10-CM | POA: Diagnosis not present

## 2019-12-10 DIAGNOSIS — K219 Gastro-esophageal reflux disease without esophagitis: Secondary | ICD-10-CM | POA: Diagnosis not present

## 2019-12-10 DIAGNOSIS — F331 Major depressive disorder, recurrent, moderate: Secondary | ICD-10-CM | POA: Diagnosis not present

## 2019-12-10 DIAGNOSIS — E785 Hyperlipidemia, unspecified: Secondary | ICD-10-CM | POA: Diagnosis not present

## 2019-12-13 ENCOUNTER — Encounter: Payer: Self-pay | Admitting: Podiatry

## 2019-12-13 ENCOUNTER — Other Ambulatory Visit: Payer: Self-pay

## 2019-12-13 ENCOUNTER — Ambulatory Visit (INDEPENDENT_AMBULATORY_CARE_PROVIDER_SITE_OTHER): Payer: Medicare HMO | Admitting: Podiatry

## 2019-12-13 DIAGNOSIS — B351 Tinea unguium: Secondary | ICD-10-CM

## 2019-12-13 DIAGNOSIS — E1140 Type 2 diabetes mellitus with diabetic neuropathy, unspecified: Secondary | ICD-10-CM | POA: Diagnosis not present

## 2019-12-13 DIAGNOSIS — E1149 Type 2 diabetes mellitus with other diabetic neurological complication: Secondary | ICD-10-CM | POA: Diagnosis not present

## 2019-12-13 DIAGNOSIS — L84 Corns and callosities: Secondary | ICD-10-CM | POA: Diagnosis not present

## 2019-12-13 DIAGNOSIS — M79674 Pain in right toe(s): Secondary | ICD-10-CM

## 2019-12-13 DIAGNOSIS — M79675 Pain in left toe(s): Secondary | ICD-10-CM | POA: Diagnosis not present

## 2019-12-13 DIAGNOSIS — F09 Unspecified mental disorder due to known physiological condition: Secondary | ICD-10-CM | POA: Insufficient documentation

## 2019-12-15 NOTE — Progress Notes (Signed)
Subjective:   Patient ID: Crystal Stafford, female   DOB: 59 y.o.   MRN: [id number]   HPI Patient presents with painful thick yellow brittle nailbeds 1-5 both feet and lesions underneath the fifth metatarsals of both feet which get tender and make it hard to walk. She has been a long-term diabetic and has reasonably good control of her sugar and patient does smoke a half a pack per day and is moderately obese   Review of Systems  All other systems reviewed and are negative.       Objective:  Physical Exam Vitals and nursing note reviewed.  Constitutional:      Appearance: She is well-developed.  Pulmonary:     Effort: Pulmonary effort is normal.  Musculoskeletal:        General: Normal range of motion.  Skin:    General: Skin is warm.  Neurological:     Mental Status: She is alert.     Neurovascular status found to be moderately diminished bilateral with diminished sharp dull vibratory. Patient is found to have keratotic lesion fifth metatarsal bilateral that are thick and painful and yellow brittle nailbeds 1-5 both feet that are dystrophic and hard to cut and can become painful. Patient has good digital perfusion well oriented x3     Assessment:  At risk diabetic with mycotic nail infection lesion formation bilateral with moderate neuro vascular issues      Plan:  H&P education rendered concerning diabetic education and debrided lesions debrided nails bilateral which can be done on a routine basis and gave strict instructions of any change were to occur to reappoint immediately

## 2019-12-20 DIAGNOSIS — E785 Hyperlipidemia, unspecified: Secondary | ICD-10-CM | POA: Diagnosis not present

## 2019-12-20 DIAGNOSIS — K219 Gastro-esophageal reflux disease without esophagitis: Secondary | ICD-10-CM | POA: Diagnosis not present

## 2019-12-20 DIAGNOSIS — I10 Essential (primary) hypertension: Secondary | ICD-10-CM | POA: Diagnosis not present

## 2019-12-20 DIAGNOSIS — E1169 Type 2 diabetes mellitus with other specified complication: Secondary | ICD-10-CM | POA: Diagnosis not present

## 2019-12-20 DIAGNOSIS — F331 Major depressive disorder, recurrent, moderate: Secondary | ICD-10-CM | POA: Diagnosis not present

## 2019-12-30 DIAGNOSIS — M5126 Other intervertebral disc displacement, lumbar region: Secondary | ICD-10-CM | POA: Diagnosis not present

## 2020-01-02 DIAGNOSIS — M25551 Pain in right hip: Secondary | ICD-10-CM | POA: Diagnosis not present

## 2020-01-06 DIAGNOSIS — M5126 Other intervertebral disc displacement, lumbar region: Secondary | ICD-10-CM | POA: Diagnosis not present

## 2020-01-15 DIAGNOSIS — M25551 Pain in right hip: Secondary | ICD-10-CM | POA: Diagnosis not present

## 2020-01-28 DIAGNOSIS — I671 Cerebral aneurysm, nonruptured: Secondary | ICD-10-CM | POA: Diagnosis not present

## 2020-02-06 ENCOUNTER — Encounter (INDEPENDENT_AMBULATORY_CARE_PROVIDER_SITE_OTHER): Payer: Self-pay

## 2020-02-19 DIAGNOSIS — I671 Cerebral aneurysm, nonruptured: Secondary | ICD-10-CM | POA: Diagnosis not present

## 2020-03-06 DIAGNOSIS — E1169 Type 2 diabetes mellitus with other specified complication: Secondary | ICD-10-CM | POA: Diagnosis not present

## 2020-03-06 DIAGNOSIS — I10 Essential (primary) hypertension: Secondary | ICD-10-CM | POA: Diagnosis not present

## 2020-03-06 DIAGNOSIS — E785 Hyperlipidemia, unspecified: Secondary | ICD-10-CM | POA: Diagnosis not present

## 2020-03-06 DIAGNOSIS — R252 Cramp and spasm: Secondary | ICD-10-CM | POA: Diagnosis not present

## 2020-03-06 DIAGNOSIS — F331 Major depressive disorder, recurrent, moderate: Secondary | ICD-10-CM | POA: Diagnosis not present

## 2020-03-13 ENCOUNTER — Ambulatory Visit: Payer: Medicare HMO | Admitting: Podiatry

## 2020-04-13 DIAGNOSIS — M79641 Pain in right hand: Secondary | ICD-10-CM | POA: Diagnosis not present

## 2020-04-13 DIAGNOSIS — G2581 Restless legs syndrome: Secondary | ICD-10-CM | POA: Diagnosis not present

## 2020-04-13 DIAGNOSIS — F331 Major depressive disorder, recurrent, moderate: Secondary | ICD-10-CM | POA: Diagnosis not present

## 2020-04-13 DIAGNOSIS — F17200 Nicotine dependence, unspecified, uncomplicated: Secondary | ICD-10-CM | POA: Diagnosis not present

## 2020-04-13 DIAGNOSIS — E785 Hyperlipidemia, unspecified: Secondary | ICD-10-CM | POA: Diagnosis not present

## 2020-04-13 DIAGNOSIS — E1169 Type 2 diabetes mellitus with other specified complication: Secondary | ICD-10-CM | POA: Diagnosis not present

## 2020-04-13 DIAGNOSIS — I10 Essential (primary) hypertension: Secondary | ICD-10-CM | POA: Diagnosis not present

## 2020-04-13 DIAGNOSIS — Z0000 Encounter for general adult medical examination without abnormal findings: Secondary | ICD-10-CM | POA: Diagnosis not present

## 2020-04-13 DIAGNOSIS — N3281 Overactive bladder: Secondary | ICD-10-CM | POA: Diagnosis not present

## 2020-04-17 ENCOUNTER — Other Ambulatory Visit: Payer: Self-pay | Admitting: Family Medicine

## 2020-04-17 ENCOUNTER — Ambulatory Visit
Admission: RE | Admit: 2020-04-17 | Discharge: 2020-04-17 | Disposition: A | Discharge status: Home / Self Care | Payer: Medicare HMO | Source: Ambulatory Visit | Attending: Family Medicine | Admitting: Family Medicine

## 2020-04-17 DIAGNOSIS — M79641 Pain in right hand: Secondary | ICD-10-CM | POA: Diagnosis not present

## 2020-04-27 DIAGNOSIS — F4312 Post-traumatic stress disorder, chronic: Secondary | ICD-10-CM | POA: Diagnosis not present

## 2020-04-27 DIAGNOSIS — F331 Major depressive disorder, recurrent, moderate: Secondary | ICD-10-CM | POA: Diagnosis not present

## 2020-05-07 DIAGNOSIS — F321 Major depressive disorder, single episode, moderate: Secondary | ICD-10-CM | POA: Diagnosis not present

## 2020-05-07 DIAGNOSIS — F4312 Post-traumatic stress disorder, chronic: Secondary | ICD-10-CM | POA: Diagnosis not present

## 2020-05-07 DIAGNOSIS — F331 Major depressive disorder, recurrent, moderate: Secondary | ICD-10-CM | POA: Diagnosis not present

## 2020-06-11 DIAGNOSIS — R69 Illness, unspecified: Secondary | ICD-10-CM | POA: Diagnosis not present

## 2020-06-11 DIAGNOSIS — I10 Essential (primary) hypertension: Secondary | ICD-10-CM | POA: Diagnosis not present

## 2020-06-11 DIAGNOSIS — K219 Gastro-esophageal reflux disease without esophagitis: Secondary | ICD-10-CM | POA: Diagnosis not present

## 2020-06-11 DIAGNOSIS — E785 Hyperlipidemia, unspecified: Secondary | ICD-10-CM | POA: Diagnosis not present

## 2020-06-11 DIAGNOSIS — E1169 Type 2 diabetes mellitus with other specified complication: Secondary | ICD-10-CM | POA: Diagnosis not present

## 2020-07-15 DIAGNOSIS — Z1159 Encounter for screening for other viral diseases: Secondary | ICD-10-CM | POA: Diagnosis not present

## 2020-07-15 DIAGNOSIS — E785 Hyperlipidemia, unspecified: Secondary | ICD-10-CM | POA: Diagnosis not present

## 2020-07-15 DIAGNOSIS — E1169 Type 2 diabetes mellitus with other specified complication: Secondary | ICD-10-CM | POA: Diagnosis not present

## 2020-07-15 DIAGNOSIS — Z23 Encounter for immunization: Secondary | ICD-10-CM | POA: Diagnosis not present

## 2020-07-15 DIAGNOSIS — I10 Essential (primary) hypertension: Secondary | ICD-10-CM | POA: Diagnosis not present

## 2020-07-15 DIAGNOSIS — R69 Illness, unspecified: Secondary | ICD-10-CM | POA: Diagnosis not present

## 2020-08-11 DIAGNOSIS — H524 Presbyopia: Secondary | ICD-10-CM | POA: Diagnosis not present

## 2020-08-11 DIAGNOSIS — H2513 Age-related nuclear cataract, bilateral: Secondary | ICD-10-CM | POA: Diagnosis not present

## 2020-08-11 DIAGNOSIS — H5203 Hypermetropia, bilateral: Secondary | ICD-10-CM | POA: Diagnosis not present

## 2020-08-11 DIAGNOSIS — E119 Type 2 diabetes mellitus without complications: Secondary | ICD-10-CM | POA: Diagnosis not present

## 2020-08-13 DIAGNOSIS — H52223 Regular astigmatism, bilateral: Secondary | ICD-10-CM | POA: Diagnosis not present

## 2020-08-13 DIAGNOSIS — H524 Presbyopia: Secondary | ICD-10-CM | POA: Diagnosis not present

## 2020-09-02 DIAGNOSIS — Z1231 Encounter for screening mammogram for malignant neoplasm of breast: Secondary | ICD-10-CM | POA: Diagnosis not present

## 2020-09-03 DIAGNOSIS — E785 Hyperlipidemia, unspecified: Secondary | ICD-10-CM | POA: Diagnosis not present

## 2020-09-03 DIAGNOSIS — Z79899 Other long term (current) drug therapy: Secondary | ICD-10-CM | POA: Diagnosis not present

## 2020-09-07 ENCOUNTER — Other Ambulatory Visit: Payer: Self-pay | Admitting: Obstetrics and Gynecology

## 2020-09-07 DIAGNOSIS — R928 Other abnormal and inconclusive findings on diagnostic imaging of breast: Secondary | ICD-10-CM

## 2020-09-24 ENCOUNTER — Ambulatory Visit
Admission: RE | Admit: 2020-09-24 | Discharge: 2020-09-24 | Disposition: A | Discharge status: Home / Self Care | Payer: Medicare Other | Source: Ambulatory Visit | Attending: Obstetrics and Gynecology | Admitting: Obstetrics and Gynecology

## 2020-09-24 ENCOUNTER — Other Ambulatory Visit: Payer: Self-pay

## 2020-09-24 DIAGNOSIS — R928 Other abnormal and inconclusive findings on diagnostic imaging of breast: Secondary | ICD-10-CM

## 2020-09-24 DIAGNOSIS — R922 Inconclusive mammogram: Secondary | ICD-10-CM | POA: Diagnosis not present

## 2020-09-24 DIAGNOSIS — N6001 Solitary cyst of right breast: Secondary | ICD-10-CM | POA: Diagnosis not present

## 2020-10-12 DIAGNOSIS — E785 Hyperlipidemia, unspecified: Secondary | ICD-10-CM | POA: Diagnosis not present

## 2020-10-12 DIAGNOSIS — K219 Gastro-esophageal reflux disease without esophagitis: Secondary | ICD-10-CM | POA: Diagnosis not present

## 2020-10-12 DIAGNOSIS — E1169 Type 2 diabetes mellitus with other specified complication: Secondary | ICD-10-CM | POA: Diagnosis not present

## 2020-10-12 DIAGNOSIS — I10 Essential (primary) hypertension: Secondary | ICD-10-CM | POA: Diagnosis not present

## 2020-11-16 DIAGNOSIS — R1319 Other dysphagia: Secondary | ICD-10-CM | POA: Diagnosis not present

## 2020-11-16 DIAGNOSIS — M2142 Flat foot [pes planus] (acquired), left foot: Secondary | ICD-10-CM | POA: Diagnosis not present

## 2020-11-16 DIAGNOSIS — R42 Dizziness and giddiness: Secondary | ICD-10-CM | POA: Diagnosis not present

## 2020-11-16 DIAGNOSIS — I10 Essential (primary) hypertension: Secondary | ICD-10-CM | POA: Diagnosis not present

## 2020-11-16 DIAGNOSIS — Z23 Encounter for immunization: Secondary | ICD-10-CM | POA: Diagnosis not present

## 2020-11-16 DIAGNOSIS — E785 Hyperlipidemia, unspecified: Secondary | ICD-10-CM | POA: Diagnosis not present

## 2020-11-16 DIAGNOSIS — M2141 Flat foot [pes planus] (acquired), right foot: Secondary | ICD-10-CM | POA: Diagnosis not present

## 2020-11-16 DIAGNOSIS — G2581 Restless legs syndrome: Secondary | ICD-10-CM | POA: Diagnosis not present

## 2020-11-16 DIAGNOSIS — E1169 Type 2 diabetes mellitus with other specified complication: Secondary | ICD-10-CM | POA: Diagnosis not present

## 2020-12-07 ENCOUNTER — Emergency Department (HOSPITAL_COMMUNITY): Payer: Medicare Other

## 2020-12-07 ENCOUNTER — Emergency Department (HOSPITAL_COMMUNITY)
Admission: EM | Admit: 2020-12-07 | Discharge: 2020-12-07 | Disposition: A | Discharge status: Home / Self Care | Payer: Medicare Other | Attending: Emergency Medicine | Admitting: Emergency Medicine

## 2020-12-07 ENCOUNTER — Ambulatory Visit (INDEPENDENT_AMBULATORY_CARE_PROVIDER_SITE_OTHER): Payer: Medicare Other

## 2020-12-07 ENCOUNTER — Encounter: Payer: Self-pay | Admitting: Podiatry

## 2020-12-07 ENCOUNTER — Ambulatory Visit (INDEPENDENT_AMBULATORY_CARE_PROVIDER_SITE_OTHER): Payer: Medicare Other | Admitting: Podiatry

## 2020-12-07 ENCOUNTER — Encounter (HOSPITAL_COMMUNITY): Payer: Self-pay

## 2020-12-07 ENCOUNTER — Other Ambulatory Visit: Payer: Self-pay

## 2020-12-07 DIAGNOSIS — R55 Syncope and collapse: Secondary | ICD-10-CM | POA: Insufficient documentation

## 2020-12-07 DIAGNOSIS — E1149 Type 2 diabetes mellitus with other diabetic neurological complication: Secondary | ICD-10-CM

## 2020-12-07 DIAGNOSIS — L84 Corns and callosities: Secondary | ICD-10-CM

## 2020-12-07 DIAGNOSIS — M47812 Spondylosis without myelopathy or radiculopathy, cervical region: Secondary | ICD-10-CM | POA: Diagnosis not present

## 2020-12-07 DIAGNOSIS — G4489 Other headache syndrome: Secondary | ICD-10-CM | POA: Diagnosis not present

## 2020-12-07 DIAGNOSIS — J45909 Unspecified asthma, uncomplicated: Secondary | ICD-10-CM | POA: Insufficient documentation

## 2020-12-07 DIAGNOSIS — E1169 Type 2 diabetes mellitus with other specified complication: Secondary | ICD-10-CM | POA: Insufficient documentation

## 2020-12-07 DIAGNOSIS — E1140 Type 2 diabetes mellitus with diabetic neuropathy, unspecified: Secondary | ICD-10-CM

## 2020-12-07 DIAGNOSIS — I10 Essential (primary) hypertension: Secondary | ICD-10-CM | POA: Diagnosis not present

## 2020-12-07 DIAGNOSIS — B351 Tinea unguium: Secondary | ICD-10-CM

## 2020-12-07 DIAGNOSIS — M79662 Pain in left lower leg: Secondary | ICD-10-CM | POA: Insufficient documentation

## 2020-12-07 DIAGNOSIS — Z79899 Other long term (current) drug therapy: Secondary | ICD-10-CM | POA: Diagnosis not present

## 2020-12-07 DIAGNOSIS — G459 Transient cerebral ischemic attack, unspecified: Secondary | ICD-10-CM | POA: Diagnosis not present

## 2020-12-07 DIAGNOSIS — M79674 Pain in right toe(s): Secondary | ICD-10-CM

## 2020-12-07 DIAGNOSIS — M79661 Pain in right lower leg: Secondary | ICD-10-CM | POA: Diagnosis not present

## 2020-12-07 DIAGNOSIS — R079 Chest pain, unspecified: Secondary | ICD-10-CM | POA: Diagnosis not present

## 2020-12-07 DIAGNOSIS — E785 Hyperlipidemia, unspecified: Secondary | ICD-10-CM | POA: Diagnosis not present

## 2020-12-07 DIAGNOSIS — R000 Tachycardia, unspecified: Secondary | ICD-10-CM | POA: Diagnosis not present

## 2020-12-07 DIAGNOSIS — M79675 Pain in left toe(s): Secondary | ICD-10-CM | POA: Diagnosis not present

## 2020-12-07 DIAGNOSIS — R519 Headache, unspecified: Secondary | ICD-10-CM | POA: Diagnosis not present

## 2020-12-07 DIAGNOSIS — M545 Low back pain, unspecified: Secondary | ICD-10-CM | POA: Insufficient documentation

## 2020-12-07 DIAGNOSIS — F17210 Nicotine dependence, cigarettes, uncomplicated: Secondary | ICD-10-CM | POA: Diagnosis not present

## 2020-12-07 DIAGNOSIS — M79604 Pain in right leg: Secondary | ICD-10-CM

## 2020-12-07 DIAGNOSIS — L989 Disorder of the skin and subcutaneous tissue, unspecified: Secondary | ICD-10-CM | POA: Diagnosis not present

## 2020-12-07 DIAGNOSIS — G8929 Other chronic pain: Secondary | ICD-10-CM | POA: Insufficient documentation

## 2020-12-07 DIAGNOSIS — Z9889 Other specified postprocedural states: Secondary | ICD-10-CM | POA: Diagnosis not present

## 2020-12-07 DIAGNOSIS — M79605 Pain in left leg: Secondary | ICD-10-CM | POA: Diagnosis not present

## 2020-12-07 LAB — COMPREHENSIVE METABOLIC PANEL
ALT: 11 U/L (ref 0–44)
AST: 20 U/L (ref 15–41)
Albumin: 3.4 g/dL — ABNORMAL LOW (ref 3.5–5.0)
Alkaline Phosphatase: 73 U/L (ref 38–126)
Anion gap: 7 (ref 5–15)
BUN: 9 mg/dL (ref 6–20)
CO2: 21 mmol/L — ABNORMAL LOW (ref 22–32)
Calcium: 8.8 mg/dL — ABNORMAL LOW (ref 8.9–10.3)
Chloride: 112 mmol/L — ABNORMAL HIGH (ref 98–111)
Creatinine, Ser: 1.19 mg/dL — ABNORMAL HIGH (ref 0.44–1.00)
GFR, Estimated: 53 mL/min — ABNORMAL LOW (ref >60)
Glucose, Bld: 84 mg/dL (ref 70–99)
Potassium: 3.9 mmol/L (ref 3.5–5.1)
Sodium: 140 mmol/L (ref 135–145)
Total Bilirubin: 0.4 mg/dL (ref 0.3–1.2)
Total Protein: 6.3 g/dL — ABNORMAL LOW (ref 6.5–8.1)

## 2020-12-07 LAB — CBC WITH DIFFERENTIAL/PLATELET
Abs Immature Granulocytes: 0.06 10*3/uL (ref 0.00–0.07)
Basophils Absolute: 0 10*3/uL (ref 0.0–0.1)
Basophils Relative: 0 %
Eosinophils Absolute: 0 10*3/uL (ref 0.0–0.5)
Eosinophils Relative: 1 %
HCT: 40 % (ref 36.0–46.0)
Hemoglobin: 13.4 g/dL (ref 12.0–15.0)
Immature Granulocytes: 1 %
Lymphocytes Relative: 17 %
Lymphs Abs: 1.1 10*3/uL (ref 0.7–4.0)
MCH: 30 pg (ref 26.0–34.0)
MCHC: 33.5 g/dL (ref 30.0–36.0)
MCV: 89.7 fL (ref 80.0–100.0)
Monocytes Absolute: 0.8 10*3/uL (ref 0.1–1.0)
Monocytes Relative: 12 %
Neutro Abs: 4.6 10*3/uL (ref 1.7–7.7)
Neutrophils Relative %: 69 %
Platelets: 244 10*3/uL (ref 150–400)
RBC: 4.46 MIL/uL (ref 3.87–5.11)
RDW: 15.9 % — ABNORMAL HIGH (ref 11.5–15.5)
WBC: 6.6 10*3/uL (ref 4.0–10.5)
nRBC: 0 % (ref 0.0–0.2)

## 2020-12-07 LAB — I-STAT CHEM 8, ED
BUN: 14 mg/dL (ref 6–20)
Calcium, Ion: 1.04 mmol/L — ABNORMAL LOW (ref 1.15–1.40)
Chloride: 109 mmol/L (ref 98–111)
Creatinine, Ser: 1.1 mg/dL — ABNORMAL HIGH (ref 0.44–1.00)
Glucose, Bld: 82 mg/dL (ref 70–99)
HCT: 42 % (ref 36.0–46.0)
Hemoglobin: 14.3 g/dL (ref 12.0–15.0)
Potassium: 6.6 mmol/L (ref 3.5–5.1)
Sodium: 137 mmol/L (ref 135–145)
TCO2: 24 mmol/L (ref 22–32)

## 2020-12-07 LAB — TROPONIN I (HIGH SENSITIVITY)
Troponin I (High Sensitivity): 4 ng/L (ref <18)
Troponin I (High Sensitivity): 4 ng/L (ref <18)

## 2020-12-07 LAB — CBG MONITORING, ED: Glucose-Capillary: 96 mg/dL (ref 70–99)

## 2020-12-07 MED ORDER — SODIUM ZIRCONIUM CYCLOSILICATE 10 G PO PACK: 10.0000 g | PACK | Freq: Once | ORAL | Status: DC

## 2020-12-07 MED ORDER — ACETAMINOPHEN 325 MG PO TABS
650.0000 mg | ORAL_TABLET | Freq: Once | ORAL | Status: AC
  Administered 2020-12-07: 19:00:00 650 mg via ORAL
  Filled 2020-12-07: qty 2

## 2020-12-07 MED ORDER — CALCIUM GLUCONATE-NACL 1-0.675 GM/50ML-% IV SOLN: 1.0000 g | Freq: Once | INTRAVENOUS | Status: DC

## 2020-12-07 MED ORDER — DIPHENHYDRAMINE HCL 50 MG/ML IJ SOLN
25.0000 mg | Freq: Once | INTRAMUSCULAR | Status: AC
  Administered 2020-12-07: 19:00:00 25 mg via INTRAVENOUS
  Filled 2020-12-07: qty 1

## 2020-12-07 MED ORDER — ALBUTEROL SULFATE (2.5 MG/3ML) 0.083% IN NEBU: 10.0000 mg | INHALATION_SOLUTION | Freq: Once | RESPIRATORY_TRACT | Status: DC

## 2020-12-07 MED ORDER — SODIUM CHLORIDE 0.9 % IV BOLUS
1000.0000 mL | Freq: Once | INTRAVENOUS | Status: AC
  Administered 2020-12-07: 19:00:00 1000 mL via INTRAVENOUS

## 2020-12-07 MED ORDER — SODIUM POLYSTYRENE SULFONATE 15 GM/60ML PO SUSP: 30.0000 g | Freq: Once | ORAL | Status: DC

## 2020-12-07 MED ORDER — PROCHLORPERAZINE EDISYLATE 10 MG/2ML IJ SOLN
10.0000 mg | Freq: Once | INTRAMUSCULAR | Status: AC
  Administered 2020-12-07: 19:00:00 10 mg via INTRAVENOUS
  Filled 2020-12-07: qty 2

## 2020-12-07 MED ORDER — IOHEXOL 350 MG/ML SOLN
75.0000 mL | Freq: Once | INTRAVENOUS | Status: AC | PRN
  Administered 2020-12-07: 18:00:00 75 mL via INTRAVENOUS

## 2020-12-07 MED ORDER — SODIUM CHLORIDE 0.9 % IV BOLUS: 500.0000 mL | Freq: Once | INTRAVENOUS | Status: DC

## 2020-12-07 MED ORDER — KETOROLAC TROMETHAMINE 15 MG/ML IJ SOLN
15.0000 mg | Freq: Once | INTRAMUSCULAR | Status: AC
  Administered 2020-12-07: 19:00:00 15 mg via INTRAVENOUS
  Filled 2020-12-07: qty 1

## 2020-12-07 NOTE — ED Triage Notes (Signed)
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 Pt was going into work when she near collapsed into the floor. Coworkers helped her down to the floor. Pt remembers going to the ground and people talking around her. Pt is c/o a headache, left sided. Per coworkers they stated she could not move the left side of her body, for EMS she is negative for stroke screen at this time and able to move both sides of the body. Pt states pain is 10/10.

## 2020-12-07 NOTE — ED Notes (Signed)
Patient verbalizes understanding of discharge instructions. Opportunity for questioning and answers were provided. Armband removed by staff, pt discharged from ED ambulatory.   

## 2020-12-07 NOTE — Discharge Instructions (Addendum)
The pleasure taking care of you today.  As we discussed your work-up today was reassuring, no evidence of stroke, or bleed in your head, or change to your aneurysm repair.  I think that your weakness was secondary to back pain, leg pain as we discussed.  Please use Tylenol or ibuprofen for pain.  You may use 600 mg ibuprofen every 6 hours or 1000 mg of Tylenol every 6 hours.  You may choose to alternate between the 2.  This would be most effective.  Not to exceed 4 g of Tylenol within 24 hours.  Not to exceed 3200 mg ibuprofen 24 hours.  Please follow-up with orthopedics if your pain continues or fails to improve.  Please return to the emergency department if you have any new episodes of weakness, collapse, vision changes, facial droop, or other strokelike symptoms.  Please follow-up with your primary care doctor for recheck, and for your normal appointments.

## 2020-12-07 NOTE — Progress Notes (Signed)
Subjective:   Patient ID: Crystal Stafford, female   DOB: 60 y.o.   MRN: [id number]   HPI Patient states she is try to keep her diabetes under good control as best as possible and has lesions on both feet that are painful and nailbeds that are thick and hard for her to wear shoe gear with   ROS      Objective:  Physical Exam  Neurovascular status unchanged long-term diabetic with moderate neuro neurological disease with thick yellow brittle nailbeds 1-5 both feet and keratotic lesion subthird metatarsal left fifth metatarsal right painful     Assessment:  At risk diabetic with chronic keratotic lesions mycotic nail infection     Plan:  Debridement of@wrist  lesions bilateral with no iatrogenic bleeding debridement of nailbeds 1-5 both feet no iatrogenic bleeding

## 2020-12-07 NOTE — ED Provider Notes (Signed)
[facility] Provider Note   CSN: [id number] Arrival date & time: 12/07/20  1522     History Chief Complaint  Patient presents with   Near Syncope    Crystal Stafford is a 60 y.o. 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  Patient reports that she woke up this morning, felt pain in her back, legs, as well as had a left-sided headache.  Patient denies any photophobia, phonophobia, confusion, or weakness when she woke up.  Patient reports that she was walking into work, and had an incident where she had left-sided leg weakness, and collapsed, needing to be caught to prevent a fall.  Patient denies loss of consciousness, feeling of lightheadedness at this time.  Patient is now endorsing continuation of leg pain, headache, and describes some chest pain that began after the incident described above, without diaphoresis, nausea, vomiting, described as sharp in nature, without radiation to the back, neck, or arms.  Patient does not have history of ACS.  Patient reports that she does follow with neurology after her aneurysm, and reports that it has been stable at time of last evaluation.   Near Syncope Associated symptoms include headaches.      Past Medical History:  Diagnosis Date   Allergy    Anxiety    Arthritis    Depression    Diabetes mellitus without complication (HCC)    GERD (gastroesophageal reflux disease)    Headache    Hyperlipidemia    Hypertension    MRSA (methicillin resistant Staphylococcus aureus)    Pre-diabetes     Patient Active Problem List   Diagnosis Date Noted   Cognitive disorder 12/13/2019   Abdominal bloating 05/10/2018   Vaginal bleeding 05/10/2018   Cerebral aneurysm, nonruptured  12/09/2016   Aneurysm ([place]) 09/29/2016   Discharge from nipple 10/26/2015   Infection due to Bacillus species 10/23/2014   Hospital discharge follow-up 10/23/2014   PICC (peripherally inserted central catheter) removal 10/23/2014   Sepsis ([place])    Leukocytosis 10/10/2014   SIRS (systemic inflammatory response syndrome) ([place]) 10/10/2014   Impaired glucose tolerance 10/10/2014   Myalgia    Essential hypertension 10/21/2013   Midline low back pain with left-sided sciatica 10/21/2013   Breast cancer screening 10/21/2013   Gastroesophageal reflux disease without esophagitis 10/21/2013   Encounter for screening mammogram for malignant neoplasm of breast 10/21/2013   Numbness and tingling 05/20/2013   Shoulder pain, right 02/18/2013   Morbid obesity ([place]) 02/18/2013   Physical exam, routine 10/25/2012   Allergy 10/25/2012   Hypertension 04/06/2012   Prediabetes 04/06/2012   Peptic ulcer 04/06/2012   INSOMNIA 02/04/2010   SEBACEOUS CYST, SCALP 08/21/2009   HYPERLIPIDEMIA 06/02/2009   HEADACHE 06/02/2009   DEPRESSION 05/19/2009   FATIGUE 05/19/2009   MIXED INCONTINENCE URGE AND STRESS 05/02/2008   TOBACCO ABUSE 11/24/2006   PELVIC PAIN, RIGHT 11/24/2006   ASTHMA 11/10/2005    Past Surgical History:  Procedure Laterality Date   ABDOMINAL HYSTERECTOMY     COLONOSCOPY     IR ANGIO INTRA EXTRACRAN SEL COM CAROTID INNOMINATE UNI R MOD SED  12/12/2017   IR ANGIO INTRA EXTRACRAN SEL INTERNAL CAROTID BILAT MOD SED  10/20/2016   IR ANGIO INTRA EXTRACRAN SEL INTERNAL CAROTID BILAT MOD SED  12/09/2016   IR ANGIO  INTRA EXTRACRAN SEL INTERNAL CAROTID BILAT MOD SED  06/16/2017   IR ANGIO INTRA EXTRACRAN SEL INTERNAL CAROTID UNI L MOD SED  12/12/2017   IR ANGIO VERTEBRAL SEL VERTEBRAL BILAT MOD SED  10/20/2016   IR ANGIO VERTEBRAL SEL VERTEBRAL BILAT MOD SED  06/16/2017   IR ANGIO VERTEBRAL SEL VERTEBRAL UNI R MOD SED  12/12/2017   IR ANGIOGRAM FOLLOW UP STUDY  12/09/2016   IR RADIOLOGY PERIPHERAL  GUIDED IV START  12/12/2017   IR TRANSCATH/EMBOLIZ  12/09/2016   IR US GUIDE VASC ACCESS RIGHT  06/16/2017   IR US GUIDE VASC ACCESS RIGHT  12/12/2017   RADIOLOGY WITH ANESTHESIA N/A 12/09/2016   Procedure: Pipeline embolization of aneurysm and possible coiling;  Surgeon: Consuella Lose, MD;  Location: [facility];  Service: Radiology;  Laterality: N/A;     OB History   No obstetric history on file.     Family History  Problem Relation Age of Onset   Colon cancer Maternal Grandfather 75   Heart disease Mother    Breast cancer Maternal Aunt    Hypertension Other    Diabetes Other    Esophageal cancer Neg Hx    Stomach cancer Neg Hx    Rectal cancer Neg Hx     Social History   Tobacco Use   Smoking status: Every Day    Packs/day: 0.50    Years: 20.00    Pack years: 10.00    Types: Cigarettes   Smokeless tobacco: Never   Tobacco comments:    tobacco info given 02/08/2018  Vaping Use   Vaping Use: Never used  Substance Use Topics   Alcohol use: Never   Drug use: Never    Home Medications Prior to Admission medications   Medication Sig Start Date End Date Taking? Authorizing Provider  atorvastatin (LIPITOR) 20 MG tablet Take 1 tablet (20 mg total) by mouth daily. 11/30/16  Yes Hairston, Toy Baker R, FNP  buPROPion (WELLBUTRIN XL) 150 MG 24 hr tablet Take 150 mg by mouth daily. 10/30/19  Yes [provider]  cetirizine (ZYRTEC) 10 MG tablet Take 1 tablet (10 mg total) by mouth 2 (two) times daily. Patient not taking: Reported on 12/07/2020 01/17/19   Valentina Shaggy, MD  fluticasone [facility]) 50 MCG/ACT nasal spray Place 2 sprays into both nostrils daily. Patient not taking: Reported on 12/07/2020 01/17/19   Valentina Shaggy, MD  meloxicam (MOBIC) 15 MG tablet Take 1 tablet (15 mg total) by mouth daily. Patient not taking: Reported on 12/07/2020 11/24/19   Augusto Gamble B, NP    Allergies    Doxycycline, Doxycycline, and Tape  Review of Systems   Review  of Systems  Cardiovascular:  Positive for near-syncope.  Musculoskeletal:  Positive for back pain and myalgias.  Neurological:  Positive for weakness and headaches.  All other systems reviewed and are negative.  Physical Exam Updated Vital Signs BP 123/79   Pulse 85   Temp 97.9 F (36.6 C) (Oral)   Resp (!) 23   Ht 5\' 4"  (1.626 m)   Wt 70.8 kg   SpO2 97%   BMI 26.78 kg/m   Physical Exam Vitals and nursing note reviewed.  Constitutional:      General: She is not in acute distress.    Appearance: Normal appearance.  HENT:     Head: Normocephalic and atraumatic.  Eyes:     General:        Right eye: No discharge.  Left eye: No discharge.  Neck:     Comments: No tenderness to palpation of the cervical spine.  No step-offs or deformity noted. Cardiovascular:     Rate and Rhythm: Normal rate and regular rhythm.     Heart sounds: No murmur heard.   No friction rub. No gallop.  Pulmonary:     Effort: Pulmonary effort is normal.     Breath sounds: Normal breath sounds.  Abdominal:     General: Bowel sounds are normal.     Palpations: Abdomen is soft.  Musculoskeletal:     Comments: Patient does not have any tenderness to palpation in specific locations in bilateral legs.,  No unilateral leg swelling.  No lower leg edema generally.  Patient does have some midline tenderness without step-off or deformity in the midline thoracic spine, as well as paraspinous muscles of the lumbar spine.  Skin:    General: Skin is warm and dry.     Capillary Refill: Capillary refill takes less than 2 seconds.  Neurological:     Mental Status: She is alert and oriented to person, place, and time.     Comments: CN III through XII grossly intact.  Intact strength 5 out of 5 bilateral upper and lower extremities.  No pronator drift.  Alert and oriented x3.  Intact finger-nose.  Psychiatric:        Mood and Affect: Mood normal.        Behavior: Behavior normal.    ED Results / Procedures /  Treatments   Labs (all labs ordered are listed, but only abnormal results are displayed) Labs Reviewed  CBC WITH DIFFERENTIAL/PLATELET - Abnormal; Notable for the following components:      Result Value   RDW 15.9 (*)    All other components within normal limits  COMPREHENSIVE METABOLIC PANEL - Abnormal; Notable for the following components:   Chloride 112 (*)    CO2 21 (*)    Creatinine, Ser 1.19 (*)    Calcium 8.8 (*)    Total Protein 6.3 (*)    Albumin 3.4 (*)    GFR, Estimated 53 (*)    All other components within normal limits  I-STAT CHEM 8, ED - Abnormal; Notable for the following components:   Potassium 6.6 (*)    Creatinine, Ser 1.10 (*)    Calcium, Ion 1.04 (*)    All other components within normal limits  URINALYSIS, ROUTINE W REFLEX MICROSCOPIC  CBG MONITORING, ED  TROPONIN I (HIGH SENSITIVITY)  TROPONIN I (HIGH SENSITIVITY)    EKG EKG Interpretation  Date/Time:  Monday December 07 2020 15:43:22 EST Ventricular Rate:  101 PR Interval:  180 QRS Duration: 90 QT Interval:  317 QTC Calculation: 411 R Axis:   46 Text Interpretation: Sinus tachycardia Sinus tachycardia, otherwise similar Confirmed by Lavenia Atlas [phone]) on 12/07/2020 4:07:45 PM  Radiology CT ANGIO HEAD NECK W WO CM  Result Date: 12/07/2020 CLINICAL DATA:  Near collapse while going into work, concern for stroke/TIA. Left-sided headache. EXAM: CT ANGIOGRAPHY HEAD AND NECK TECHNIQUE: Multidetector CT imaging of the head and neck was performed using the standard protocol during bolus administration of intravenous contrast. Multiplanar CT image reconstructions and MIPs were obtained to evaluate the vascular anatomy. Carotid stenosis measurements (when applicable) are obtained utilizing NASCET criteria, using the distal internal carotid diameter as the denominator. CONTRAST:  43mL OMNIPAQUE IOHEXOL 350 MG/ML SOLN COMPARISON:  CT head 09/08/2016 FINDINGS: CT HEAD FINDINGS Brain: There is no evidence of  acute intracranial  hemorrhage, extra-axial fluid collection, or acute infarct. Parenchymal volume is normal.  The ventricles are normal in size. There is no solid mass lesion.  There is no midline shift. Vascular: Calcifications in the suprasellar region are consistent with the previously treated giant aneurysm. The vasculature is better assessed below. Skull: Normal. Negative for fracture or focal lesion. Sinuses and orbits: The paranasal sinuses are clear. The globes and orbits are unremarkable. Review of the MIP images confirms the above findings CTA NECK FINDINGS Aortic arch: Standard branching. Imaged portion shows no evidence of aneurysm or dissection. No significant stenosis of the major arch vessel origins. Right carotid system: There is motion artifact at the level of the carotid bulb. Within this confines, there is no evidence of hemodynamically significant stenosis, occlusion, dissection, or aneurysm. Left carotid system: The left common, internal, and external carotid arteries are patent, without hemodynamically significant stenosis, occlusion, dissection, or aneurysm. Vertebral arteries: The vertebral arteries are patent, without hemodynamically significant stenosis, occlusion, dissection, or aneurysm. Skeleton: There is mild degenerative change in the cervical spine, most advanced at C5-C6. There is no visible canal hematoma. Other neck: There is a 0.8 cm hypodense lesion in the subcutaneous tissues of the right cheek which may reflect a sebaceous cyst. The soft tissues are otherwise unremarkable. Upper chest: Imaged lung apices are clear. Review of the MIP images confirms the above findings CTA HEAD FINDINGS Anterior circulation: Postsurgical changes reflecting prior pipeline embolization of a large left paraophthalmic aneurysm are seen. The stent appears to be patent. There is no residual or recurrent filling of the treated aneurysm. The right intracranial ICA is patent. The bilateral MCAs are  patent. The bilateral ACAs are patent. There is no new aneurysm. Posterior circulation: The bilateral V4 segments are patent. The basilar artery is patent. The bilateral PCAs are patent. The right posterior communicating artery is identified. The left posterior communicating artery is not definitely identified. There is no aneurysm. Venous sinuses: As permitted by contrast timing, patent. Anatomic variants: None. Review of the MIP images confirms the above findings IMPRESSION: 1. No acute intracranial pathology. 2. Patent vasculature of the head and neck, without hemodynamically significant stenosis, occlusion, or dissection. 3. Patent left intracranial ICA stent without evidence of residual or recurrent filling of the treated paraophthalmic aneurysm. Electronically Signed   By: Valetta Mole M.D.   On: 12/07/2020 18:28   DG Chest 2 View  Result Date: 12/07/2020 CLINICAL DATA:  Chest pain EXAM: CHEST - 2 VIEW COMPARISON:  05/06/2017 FINDINGS: The heart size and mediastinal contours are within normal limits. Both lungs are clear. Disc degenerative disease of the thoracic spine. IMPRESSION: No acute abnormality of the lungs. Electronically Signed   By: Delanna Ahmadi M.D.   On: 12/07/2020 17:11    Procedures Procedures   Medications Ordered in ED Medications  iohexol (OMNIPAQUE) 350 MG/ML injection 75 mL (75 mLs Intravenous Contrast Given 12/07/20 1800)  ketorolac (TORADOL) 15 MG/ML injection 15 mg (15 mg Intravenous Given 12/07/20 1903)  prochlorperazine (COMPAZINE) injection 10 mg (10 mg Intravenous Given 12/07/20 1902)  diphenhydrAMINE (BENADRYL) injection 25 mg (25 mg Intravenous Given 12/07/20 1902)  acetaminophen (TYLENOL) tablet 650 mg (650 mg Oral Given 12/07/20 1905)  sodium chloride 0.9 % bolus 1,000 mL (0 mLs Intravenous Stopped 12/07/20 2007)    ED Course  I have reviewed the triage vital signs and the nursing notes.  Pertinent labs & imaging results that were available during my care  of the patient were reviewed by me and  considered in my medical decision making (see chart for details).    MDM Rules/Calculators/A&P                         I discussed this case with my attending physician who cosigned this note including patient's presenting symptoms, physical exam, and planned diagnostics and interventions. Attending physician stated agreement with plan or made changes to plan which were implemented.   Attending physician assessed patient at bedside.  Patient with unilateral left-sided headache without acute neurologic deficit on my assessment.  Patient without any remaining left-sided leg weakness.  Patient did have an episode of urinary incontinence while waiting for our examination.  Patient now with new constellation of symptoms in addition to headache, left-sided weakness.  Patient is endorsing some chest pain without shortness of breath, without radiation.  Patient also has some back pain with leg pain.  Patient does have a history of chronic back pain, and has seen an orthopedic doctor for spinal steroid injections in the past.  Given new onset headache, possible left-sided weakness in setting of history of aneurysm will obtain CT angiogram.  I-STAT 8 reveals critical lab value of potassium 6.6 this time.  We will rerun to ensure there is no hemolysis noted, however we will begin hyperkalemia treatment at this time.  Patient without any EKG changes at this time.  CT angiogram without any evidence of aneurysm at this time, or other acute intracranial abnormality.  Patient still with left-sided headache, will treat as migraine at this time.  Patient still complaining of leg pain, without leg weakness at this time.  Given some chronic back pain changes, some concern for back spasm, MSK nature of patient's complaints today.   Headache resolved with migraine cocktail.  Leg pain resolved at this time.  Patient without any balance issues, or strength deficit of bilateral lower  extremities.  Patient denies any chest pain at this time.  Troponin negative x2, normal chest x-ray, nonischemic EKG.  Repeat potassium within normal limits, favor hemolysis of i-STAT sample.  Discussed headache was consistent with migraine at this time, and is not intractable with medication which is reassuring.  No evidence of worsening of aneurysm at this time, negative head CT overall.  Believe that leg weakness, leg pain, back pain are related to chronic musculoskeletal changes.  No acute neurodeficits noted throughout patient's evaluation.  Patient discharged in stable condition at this time, encouraged to follow-up with orthopedics if she continues to have back, leg pain.  Return precautions given. Final Clinical Impression(s) / ED Diagnoses Final diagnoses:  Acute nonintractable headache, unspecified headache type  Chronic midline low back pain without sciatica  Pain in both lower extremities    Rx / DC Orders ED Discharge Orders     None        Anselmo Pickler, PA-C 12/07/20 2212    Lorelle Gibbs, DO 12/07/20 2333

## 2020-12-07 NOTE — ED Notes (Signed)
Lab called again and asked to run CMP

## 2020-12-07 NOTE — ED Notes (Signed)
Lab to run CMP off of previous labs

## 2020-12-09 ENCOUNTER — Ambulatory Visit: Payer: Medicare Other | Admitting: Gastroenterology

## 2020-12-10 ENCOUNTER — Other Ambulatory Visit: Payer: Self-pay | Admitting: Podiatry

## 2020-12-10 DIAGNOSIS — E1140 Type 2 diabetes mellitus with diabetic neuropathy, unspecified: Secondary | ICD-10-CM

## 2020-12-14 ENCOUNTER — Encounter: Payer: Self-pay | Admitting: Family Medicine

## 2021-01-29 ENCOUNTER — Ambulatory Visit (HOSPITAL_COMMUNITY)
Admission: EM | Admit: 2021-01-29 | Discharge: 2021-01-29 | Disposition: A | Discharge status: Home / Self Care | Payer: Medicare Other | Attending: Family Medicine | Admitting: Family Medicine

## 2021-01-29 ENCOUNTER — Other Ambulatory Visit: Payer: Self-pay

## 2021-01-29 ENCOUNTER — Encounter (HOSPITAL_COMMUNITY): Payer: Self-pay | Admitting: Emergency Medicine

## 2021-01-29 DIAGNOSIS — M79641 Pain in right hand: Secondary | ICD-10-CM

## 2021-01-29 MED ORDER — SULFAMETHOXAZOLE-TRIMETHOPRIM 800-160 MG PO TABS
1.0000 | ORAL_TABLET | Freq: Two times a day (BID) | ORAL | 0 refills | Status: AC
Start: 2021-01-29 — End: 2021-02-05

## 2021-01-29 MED ORDER — PREDNISONE 20 MG PO TABS: 40.0000 mg | ORAL_TABLET | Freq: Every day | ORAL | 0 refills | Status: AC

## 2021-01-29 NOTE — ED Provider Notes (Signed)
[facility]    CSN: [id number] Arrival date & time: 01/29/21  0805      History   Chief Complaint Chief Complaint  Patient presents with   Hand Pain    HPI Crystal Stafford is a 61 y.o. female.    Hand Pain  Here for right hand pain since yesterday.  It is principally around her MCP and interphalangeal joint of the right thumb.  Also into the finger pad of the right thumb.  She does however also note pain when she flexes her fingers into a grip.  No trauma.  No fever.  Past Medical History:  Diagnosis Date   Allergy    Anxiety    Arthritis    Depression    Diabetes mellitus without complication (HCC)    GERD (gastroesophageal reflux disease)    Headache    Hyperlipidemia    Hypertension    MRSA (methicillin resistant Staphylococcus aureus)    Pre-diabetes     Patient Active Problem List   Diagnosis Date Noted   Cognitive disorder 12/13/2019   Abdominal bloating 05/10/2018   Vaginal bleeding 05/10/2018   Cerebral aneurysm, nonruptured 12/09/2016   Aneurysm ([place]) 09/29/2016   Discharge from nipple 10/26/2015   Infection due to Bacillus species 10/23/2014   Hospital discharge follow-up 10/23/2014   PICC (peripherally inserted central catheter) removal 10/23/2014   Sepsis ([place])    Leukocytosis 10/10/2014   SIRS (systemic inflammatory response syndrome) ([place]) 10/10/2014   Impaired glucose tolerance 10/10/2014   Myalgia    Essential hypertension 10/21/2013   Midline low back pain with left-sided sciatica 10/21/2013   Breast cancer screening 10/21/2013   Gastroesophageal reflux disease without esophagitis 10/21/2013   Encounter for screening mammogram for malignant neoplasm of breast 10/21/2013   Numbness and tingling 05/20/2013   Shoulder pain, right 02/18/2013   Morbid obesity ([place]) 02/18/2013   Physical exam, routine 10/25/2012   Allergy 10/25/2012   Hypertension 04/06/2012   Prediabetes 04/06/2012   Peptic ulcer 04/06/2012   INSOMNIA  02/04/2010   SEBACEOUS CYST, SCALP 08/21/2009   HYPERLIPIDEMIA 06/02/2009   HEADACHE 06/02/2009   DEPRESSION 05/19/2009   FATIGUE 05/19/2009   MIXED INCONTINENCE URGE AND STRESS 05/02/2008   TOBACCO ABUSE 11/24/2006   PELVIC PAIN, RIGHT 11/24/2006   ASTHMA 11/10/2005    Past Surgical History:  Procedure Laterality Date   ABDOMINAL HYSTERECTOMY     COLONOSCOPY     IR ANGIO INTRA EXTRACRAN SEL COM CAROTID INNOMINATE UNI R MOD SED  12/12/2017   IR ANGIO INTRA EXTRACRAN SEL INTERNAL CAROTID BILAT MOD SED  10/20/2016   IR ANGIO INTRA EXTRACRAN SEL INTERNAL CAROTID BILAT MOD SED  12/09/2016   IR ANGIO INTRA EXTRACRAN SEL INTERNAL CAROTID BILAT MOD SED  06/16/2017   IR ANGIO INTRA EXTRACRAN SEL INTERNAL CAROTID UNI L MOD SED  12/12/2017   IR ANGIO VERTEBRAL SEL VERTEBRAL BILAT MOD SED  10/20/2016   IR ANGIO VERTEBRAL SEL VERTEBRAL BILAT MOD SED  06/16/2017   IR ANGIO VERTEBRAL SEL VERTEBRAL UNI R MOD SED  12/12/2017   IR ANGIOGRAM FOLLOW UP STUDY  12/09/2016   IR RADIOLOGY PERIPHERAL GUIDED IV START  12/12/2017   IR TRANSCATH/EMBOLIZ  12/09/2016   IR US GUIDE VASC ACCESS RIGHT  06/16/2017   IR US GUIDE VASC ACCESS RIGHT  12/12/2017   RADIOLOGY WITH ANESTHESIA N/A 12/09/2016   Procedure: Pipeline embolization of aneurysm and possible coiling;  Surgeon: Consuella Lose, MD;  Location: [facility];  Service: Radiology;  Laterality: N/A;    OB History   No obstetric history on file.      Home Medications    Prior to Admission medications   Medication Sig Start Date End Date Taking? Authorizing Provider  predniSONE (DELTASONE) 20 MG tablet Take 2 tablets (40 mg total) by mouth daily with breakfast for 5 days. 01/29/21 02/03/21 Yes Federico Maiorino, Gwenlyn Perking, MD  sulfamethoxazole-trimethoprim (BACTRIM DS) 800-160 MG tablet Take 1 tablet by mouth 2 (two) times daily for 7 days. 01/29/21 02/05/21 Yes Delores Thelen, Gwenlyn Perking, MD  atorvastatin (LIPITOR) 20 MG tablet Take 1 tablet (20 mg total) by mouth daily. 11/30/16    Alfonse Spruce, FNP  buPROPion (WELLBUTRIN XL) 150 MG 24 hr tablet Take 150 mg by mouth daily. 10/30/19   [provider]    Family History Family History  Problem Relation Age of Onset   Colon cancer Maternal Grandfather 49   Heart disease Mother    Breast cancer Maternal Aunt    Hypertension Other    Diabetes Other    Esophageal cancer Neg Hx    Stomach cancer Neg Hx    Rectal cancer Neg Hx     Social History Social History   Tobacco Use   Smoking status: Every Day    Packs/day: 0.50    Years: 20.00    Pack years: 10.00    Types: Cigarettes   Smokeless tobacco: Never   Tobacco comments:    tobacco info given 02/08/2018  Vaping Use   Vaping Use: Never used  Substance Use Topics   Alcohol use: Never   Drug use: Never     Allergies   Doxycycline, Doxycycline, and Tape   Review of Systems Review of Systems   Physical Exam Triage Vital Signs ED Triage Vitals  Enc Vitals Group     BP 01/29/21 0817 (!) 154/91     Pulse Rate 01/29/21 0817 97     Resp 01/29/21 0817 18     Temp 01/29/21 0817 98.5 F (36.9 C)     Temp Source 01/29/21 0817 Oral     SpO2 01/29/21 0817 100 %     Weight --      Height --      Head Circumference --      Peak Flow --      Pain Score 01/29/21 0816 10     Pain Loc --      Pain Edu? --      Excl. in [place]? --    No data found.  Updated Vital Signs BP (!) 154/91 (BP Location: Left Arm)    Pulse 97    Temp 98.5 F (36.9 C) (Oral)    Resp 18    SpO2 100%   Visual Acuity Right Eye Distance:   Left Eye Distance:   Bilateral Distance:    Right Eye Near:   Left Eye Near:    Bilateral Near:     Physical Exam Vitals reviewed.  Constitutional:      General: She is not in acute distress.    Appearance: She is not ill-appearing, toxic-appearing or diaphoretic.  HENT:     Mouth/Throat:     Mouth: Mucous membranes are moist.  Eyes:     Extraocular Movements: Extraocular movements intact.     Conjunctiva/sclera:  Conjunctivae normal.     Pupils: Pupils are equal, round, and reactive to light.  Cardiovascular:     Rate and Rhythm: Normal rate and regular rhythm.  Heart sounds: No murmur heard. Pulmonary:     Effort: Pulmonary effort is normal.     Breath sounds: Normal breath sounds.  Musculoskeletal:     Cervical back: Neck supple.     Comments: She has some mild swelling of the entire thumb. ?poss mild erythema. No edema of the hand/palm/other fingers, though they are tender too. Wrist is not tender. Pulse radial and ulnar are 2+. Cap refill of the distal thumb normal.  Lymphadenopathy:     Cervical: No cervical adenopathy.  Skin:    Capillary Refill: Capillary refill takes less than 2 seconds.     Coloration: Skin is not jaundiced or pale.  Neurological:     General: No focal deficit present.     Mental Status: She is alert and oriented to person, place, and time.  Psychiatric:        Behavior: Behavior normal.     UC Treatments / Results  Labs (all labs ordered are listed, but only abnormal results are displayed) Labs Reviewed - No data to display  EKG   Radiology No results found.  Procedures Procedures (including critical care time)  Medications Ordered in UC Medications - No data to display  Initial Impression / Assessment and Plan / UC Course  I have reviewed the triage vital signs and the nursing notes.  Pertinent labs & imaging results that were available during my care of the patient were reviewed by me and considered in my medical decision making (see chart for details).     With no h/o trauma, I will not xray today. Will treat with prednisone for poss arthritis flare, bactrim for poss cellulitis(pt has h/o MRSA in her problem list) Staff will apply a wrist brace with thumb extension Final Clinical Impressions(s) / UC Diagnoses   Final diagnoses:  Hand pain, right     Discharge Instructions      Take prednisone 20 mg, 2 tablets daily for 5  days.  Take the sulfa antibiotic 1 tab twice daily with food for 7 days.  This is for possible infection in the tissue of your thumb.  If this pain is worsening instead of improving or if the swelling is not worsening instead of improving, go to the emergency room     ED Prescriptions     Medication Sig Dispense Auth. Provider   predniSONE (DELTASONE) 20 MG tablet Take 2 tablets (40 mg total) by mouth daily with breakfast for 5 days. 10 tablet Barrett Henle, MD   sulfamethoxazole-trimethoprim (BACTRIM DS) 800-160 MG tablet Take 1 tablet by mouth 2 (two) times daily for 7 days. 14 tablet Kaycen Whitworth, Gwenlyn Perking, MD      PDMP not reviewed this encounter.   Barrett Henle, MD 01/29/21 [phone]

## 2021-01-29 NOTE — ED Triage Notes (Signed)
Pt reports right hand pain that started last night then woke up with swelling and severe pain. Denies injury.

## 2021-01-29 NOTE — Discharge Instructions (Addendum)
Take prednisone 20 mg, 2 tablets daily for 5 days.  Take the sulfa antibiotic 1 tab twice daily with food for 7 days.  This is for possible infection in the tissue of your thumb.  If this pain is worsening instead of improving or if the swelling is not worsening instead of improving, go to the emergency room

## 2021-03-17 ENCOUNTER — Encounter: Payer: Self-pay | Admitting: Internal Medicine

## 2021-05-20 DIAGNOSIS — E785 Hyperlipidemia, unspecified: Secondary | ICD-10-CM | POA: Diagnosis not present

## 2021-05-20 DIAGNOSIS — E1169 Type 2 diabetes mellitus with other specified complication: Secondary | ICD-10-CM | POA: Diagnosis not present

## 2021-05-20 DIAGNOSIS — Z8669 Personal history of other diseases of the nervous system and sense organs: Secondary | ICD-10-CM | POA: Diagnosis not present

## 2021-05-20 DIAGNOSIS — K219 Gastro-esophageal reflux disease without esophagitis: Secondary | ICD-10-CM | POA: Diagnosis not present

## 2021-05-20 DIAGNOSIS — G2581 Restless legs syndrome: Secondary | ICD-10-CM | POA: Diagnosis not present

## 2021-05-20 DIAGNOSIS — L84 Corns and callosities: Secondary | ICD-10-CM | POA: Diagnosis not present

## 2021-05-20 DIAGNOSIS — J3089 Other allergic rhinitis: Secondary | ICD-10-CM | POA: Diagnosis not present

## 2021-05-20 DIAGNOSIS — I10 Essential (primary) hypertension: Secondary | ICD-10-CM | POA: Diagnosis not present

## 2021-05-20 DIAGNOSIS — Z0000 Encounter for general adult medical examination without abnormal findings: Secondary | ICD-10-CM | POA: Diagnosis not present

## 2021-05-20 DIAGNOSIS — F17200 Nicotine dependence, unspecified, uncomplicated: Secondary | ICD-10-CM | POA: Diagnosis not present

## 2021-06-01 DIAGNOSIS — L603 Nail dystrophy: Secondary | ICD-10-CM | POA: Diagnosis not present

## 2021-06-01 DIAGNOSIS — D2371 Other benign neoplasm of skin of right lower limb, including hip: Secondary | ICD-10-CM | POA: Diagnosis not present

## 2021-06-01 DIAGNOSIS — E1142 Type 2 diabetes mellitus with diabetic polyneuropathy: Secondary | ICD-10-CM | POA: Diagnosis not present

## 2021-06-01 DIAGNOSIS — M21962 Unspecified acquired deformity of left lower leg: Secondary | ICD-10-CM | POA: Diagnosis not present

## 2021-06-18 DIAGNOSIS — L603 Nail dystrophy: Secondary | ICD-10-CM | POA: Diagnosis not present

## 2021-06-18 DIAGNOSIS — L608 Other nail disorders: Secondary | ICD-10-CM | POA: Diagnosis not present

## 2021-06-22 DIAGNOSIS — I739 Peripheral vascular disease, unspecified: Secondary | ICD-10-CM | POA: Diagnosis not present

## 2021-06-22 DIAGNOSIS — L602 Onychogryphosis: Secondary | ICD-10-CM | POA: Diagnosis not present

## 2021-06-22 DIAGNOSIS — M21961 Unspecified acquired deformity of right lower leg: Secondary | ICD-10-CM | POA: Diagnosis not present

## 2021-07-06 DIAGNOSIS — E1142 Type 2 diabetes mellitus with diabetic polyneuropathy: Secondary | ICD-10-CM | POA: Diagnosis not present

## 2021-07-06 DIAGNOSIS — D2371 Other benign neoplasm of skin of right lower limb, including hip: Secondary | ICD-10-CM | POA: Diagnosis not present

## 2021-07-16 ENCOUNTER — Other Ambulatory Visit: Payer: Self-pay | Admitting: *Deleted

## 2021-07-16 DIAGNOSIS — F17210 Nicotine dependence, cigarettes, uncomplicated: Secondary | ICD-10-CM

## 2021-07-16 DIAGNOSIS — Z87891 Personal history of nicotine dependence: Secondary | ICD-10-CM

## 2021-07-16 DIAGNOSIS — Z122 Encounter for screening for malignant neoplasm of respiratory organs: Secondary | ICD-10-CM

## 2021-08-11 ENCOUNTER — Encounter: Payer: Self-pay | Admitting: Acute Care

## 2021-08-11 ENCOUNTER — Ambulatory Visit (INDEPENDENT_AMBULATORY_CARE_PROVIDER_SITE_OTHER): Payer: Medicare Other | Admitting: Acute Care

## 2021-08-11 DIAGNOSIS — F17210 Nicotine dependence, cigarettes, uncomplicated: Secondary | ICD-10-CM

## 2021-08-11 NOTE — Progress Notes (Signed)
Virtual Visit via Telephone Note  I connected with Crystal Stafford on 08/11/21 at  9:30 AM EDT by telephone and verified that I am speaking with the correct person using two identifiers.  Location: Patient: At home Provider: [place], [place], [place], [address]    I discussed the limitations, risks, security and privacy concerns of performing an evaluation and management service by telephone and the availability of in person appointments. I also discussed with the patient that there may be a patient responsible charge related to this service. The patient expressed understanding and agreed to proceed.    Shared Decision Making Visit Lung Cancer Screening Program [phone])   Eligibility: Age 61 y.o. Pack Years Smoking History Calculation 20 pack smoking history (# packs/per year x # years smoked) Recent History of coughing up blood  no Unexplained weight loss? no ( >Than 15 pounds within the last 6 months ) Prior History Lung / other cancer no (Diagnosis within the last 5 years already requiring surveillance chest CT Scans). Smoking Status Current Smoker Former Smokers: Years since quit:  NA  Quit Date:  NA  Visit Components: Discussion included one or more decision making aids. yes Discussion included risk/benefits of screening. yes Discussion included potential follow up diagnostic testing for abnormal scans. yes Discussion included meaning and risk of over diagnosis. yes Discussion included meaning and risk of False Positives. yes Discussion included meaning of total radiation exposure. yes  Counseling Included: Importance of adherence to annual lung cancer LDCT screening. yes Impact of comorbidities on ability to participate in the program. yes Ability and willingness to under diagnostic treatment. yes  Smoking Cessation Counseling: Current Smokers:  Discussed importance of smoking cessation. yes Information about tobacco cessation classes and interventions  provided to patient. yes Patient provided with "ticket" for LDCT Scan. yes Symptomatic Patient. no  Counseling NA Diagnosis Code: Tobacco Use Z72.0 Asymptomatic Patient yes  Counseling (Intermediate counseling: > three minutes counseling) V2536 Former Smokers:  Discussed the importance of maintaining cigarette abstinence. yes Diagnosis Code: Personal History of Nicotine Dependence. U44.034 Information about tobacco cessation classes and interventions provided to patient. Yes Patient provided with "ticket" for LDCT Scan. yes Written Order for Lung Cancer Screening with LDCT placed in Epic. Yes (CT Chest Lung Cancer Screening Low Dose W/O CM) [id number] Z12.2-Screening of respiratory organs Z87.891-Personal history of nicotine dependence  I have spent 25 minutes of face to face/ virtual visit   time with  Crystal Stafford discussing the risks and benefits of lung cancer screening. We viewed / discussed a power point together that explained in detail the above noted topics. We paused at intervals to allow for questions to be asked and answered to ensure understanding.We discussed that the single most powerful action that she can take to decrease her risk of developing lung cancer is to quit smoking. We discussed whether or not she is ready to commit to setting a quit date. We discussed options for tools to aid in quitting smoking including nicotine replacement therapy, non-nicotine medications, support groups, Quit Smart classes, and behavior modification. We discussed that often times setting smaller, more achievable goals, such as eliminating 1 cigarette a day for a week and then 2 cigarettes a day for a week can be helpful in slowly decreasing the number of cigarettes smoked. This allows for a sense of accomplishment as well as providing a clinical benefit. I provided  her  with smoking cessation  information  with contact information for community resources, classes, free  nicotine replacement therapy, and  access to mobile apps, text messaging, and on-line smoking cessation help. I have also provided  her  the office contact information in the event she needs to contact me, or the screening staff. We discussed the time and location of the scan, and that either Doroteo Glassman RN, Joella Prince, RN  or I will call / send a letter with the results within 24-72 hours of receiving them. The patient verbalized understanding of all of  the above and had no further questions upon leaving the office. They have my contact information in the event they have any further questions.  I spent 3 minutes counseling on smoking cessation and the health risks of continued tobacco abuse.  I explained to the patient that there has been a high incidence of coronary artery disease noted on these exams. I explained that this is a non-gated exam therefore degree or severity cannot be determined. This patient is on statin therapy. I have asked the patient to follow-up with their PCP regarding any incidental finding of coronary artery disease and management with diet or medication as their PCP  feels is clinically indicated. The patient verbalized understanding of the above and had no further questions upon completion of the visit.      Crystal Spatz, NP 08/11/2021

## 2021-08-11 NOTE — Patient Instructions (Signed)
Thank you for participating in the [facility] Scre[facility]. It was our pleasure to meet you today. We will call you with the results of your scan within the next few days. Your scan will be assigned a Lung RADS category score by the physicians reading the scans.  This Lung RADS score determines follow up scanning.  See below for description of categories, and follow up screening recommendations. We will be in touch to schedule your follow up screening annually or based on recommendations of our providers. We will fax a copy of your scan results to your Primary Care Physician, or the physician who referred you to the program, to ensure they have the results. Please call the office if you have any questions or concerns regarding your scanning experience or results.  Our office number is [phone]. Please speak with Denise Phelps, RN. , or  Denise Buckner RN, They are  our Lung Cancer Screening RN.'s If They are unavailable when you call, Please leave a message on the voice mail. We will return your call at our earliest convenience.This voice mail is monitored several times a day.  Remember, if your scan is normal, we will scan you annually as long as you continue to meet the criteria for the program. (Age 55-77, Current smoker or smoker who has quit within the last 15 years). If you are a smoker, remember, quitting is the single most powerful action that you can take to decrease your risk of lung cancer and other pulmonary, breathing related problems. We know quitting is hard, and we are here to help.  Please let us know if there is anything we can do to help you meet your goal of quitting. If you are a former smoker, congratulations. We are proud of you! Remain smoke free! Remember you can refer friends or family members through the number above.  We will screen them to make sure they meet criteria for the program. Thank you for helping us take better care of you by  participating in Lung Screening.  You can receive free nicotine replacement therapy ( patches, gum or mints) by calling 1-800-QUIT NOW. Please call so we can get you on the path to becoming  a non-smoker. I know it is hard, but you can do this!  Lung RADS Categories:  Lung RADS 1: no nodules or definitely non-concerning nodules.  Recommendation is for a repeat annual scan in 12 months.  Lung RADS 2:  nodules that are non-concerning in appearance and behavior with a very low likelihood of becoming an active cancer. Recommendation is for a repeat annual scan in 12 months.  Lung RADS 3: nodules that are probably non-concerning , includes nodules with a low likelihood of becoming an active cancer.  Recommendation is for a 6-month repeat screening scan. Often noted after an upper respiratory illness. We will be in touch to make sure you have no questions, and to schedule your 6-month scan.  Lung RADS 4 A: nodules with concerning findings, recommendation is most often for a follow up scan in 3 months or additional testing based on our provider's assessment of the scan. We will be in touch to make sure you have no questions and to schedule the recommended 3 month follow up scan.  Lung RADS 4 B:  indicates findings that are concerning. We will be in touch with you to schedule additional diagnostic testing based on our provider's  assessment of the scan.  Other options for assistance in smoking cessation (   As covered by your insurance benefits)  Hypnosis for smoking cessation  [employer] [phone]  Acupuncture for smoking cessation  [facility] [phone]   

## 2021-08-12 ENCOUNTER — Ambulatory Visit
Admission: RE | Admit: 2021-08-12 | Discharge: 2021-08-12 | Disposition: A | Discharge status: Home / Self Care | Payer: Medicare Other | Source: Ambulatory Visit | Attending: Acute Care | Admitting: Acute Care

## 2021-08-12 DIAGNOSIS — F17210 Nicotine dependence, cigarettes, uncomplicated: Secondary | ICD-10-CM

## 2021-08-12 DIAGNOSIS — Z87891 Personal history of nicotine dependence: Secondary | ICD-10-CM

## 2021-08-12 DIAGNOSIS — Z122 Encounter for screening for malignant neoplasm of respiratory organs: Secondary | ICD-10-CM

## 2021-08-13 ENCOUNTER — Other Ambulatory Visit: Payer: Self-pay | Admitting: Acute Care

## 2021-08-13 DIAGNOSIS — Z122 Encounter for screening for malignant neoplasm of respiratory organs: Secondary | ICD-10-CM

## 2021-08-13 DIAGNOSIS — Z87891 Personal history of nicotine dependence: Secondary | ICD-10-CM

## 2021-08-13 DIAGNOSIS — F17210 Nicotine dependence, cigarettes, uncomplicated: Secondary | ICD-10-CM

## 2021-09-06 DIAGNOSIS — E1142 Type 2 diabetes mellitus with diabetic polyneuropathy: Secondary | ICD-10-CM | POA: Diagnosis not present

## 2021-09-17 ENCOUNTER — Telehealth: Payer: Self-pay

## 2021-09-17 DIAGNOSIS — Z1231 Encounter for screening mammogram for malignant neoplasm of breast: Secondary | ICD-10-CM | POA: Diagnosis not present

## 2021-09-17 NOTE — Patient Instructions (Signed)
Visit Information  Thank you for taking time to visit with me today. Please don't hesitate to contact me if I can be of assistance to you.   Following are the goals we discussed today:   Goals Addressed             This Visit's Progress    COMPLETED: Care Coordination Activities: No follow up required       Care Coordination Interventions: Advised patient to Annual Wellness visit and Flu vaccine           If you are experiencing a Mental Health or [place] or need someone to talk to, please    Patient verbalizes understanding of instructions and care plan provided today and agrees to view in [facility]. Active MyChart status and patient understanding of how to access instructions and care plan via MyChart confirmed with patient.     No further follow up required: patient decline  Jone Baseman, RN, MSN [facility] Care Management Coordinator Direct Line [phone] Toll Free: [phone]  Fax: [phone]

## 2021-09-17 NOTE — Patient Outreach (Signed)
  Care Coordination   Initial Visit Note   09/17/2021 Name: Crystal Stafford MRN: [id number] DOB: July 31, 1960  Crystal Stafford is a 61 y.o. year old female who sees Harlan Stains, MD for primary care. I spoke with  [name] by phone today.  What matters to the patients health and wellness today?  none    Goals Addressed             This Visit's Progress    COMPLETED: Care Coordination Activities: No follow up required       Care Coordination Interventions: Advised patient to Annual Wellness visit and Flu vaccine          SDOH assessments and interventions completed:  No     Care Coordination Interventions Activated:  Yes  Care Coordination Interventions:  Yes, provided   Follow up plan: No further intervention required.   Encounter Outcome:  Pt. Visit Completed   Jone Baseman, RN, MSN [facility] Care Management Coordinator Direct Line [phone] Toll Free: [phone]  Fax: [phone]

## 2021-10-05 DIAGNOSIS — H5203 Hypermetropia, bilateral: Secondary | ICD-10-CM | POA: Diagnosis not present

## 2021-11-08 DIAGNOSIS — E1151 Type 2 diabetes mellitus with diabetic peripheral angiopathy without gangrene: Secondary | ICD-10-CM | POA: Diagnosis not present

## 2022-06-03 ENCOUNTER — Other Ambulatory Visit: Payer: Self-pay | Admitting: Family Medicine

## 2022-06-03 DIAGNOSIS — E1169 Type 2 diabetes mellitus with other specified complication: Secondary | ICD-10-CM | POA: Diagnosis not present

## 2022-06-03 DIAGNOSIS — G2581 Restless legs syndrome: Secondary | ICD-10-CM | POA: Diagnosis not present

## 2022-06-03 DIAGNOSIS — F17200 Nicotine dependence, unspecified, uncomplicated: Secondary | ICD-10-CM | POA: Diagnosis not present

## 2022-06-03 DIAGNOSIS — E119 Type 2 diabetes mellitus without complications: Secondary | ICD-10-CM | POA: Diagnosis not present

## 2022-06-03 DIAGNOSIS — I700 Atherosclerosis of aorta: Secondary | ICD-10-CM | POA: Diagnosis not present

## 2022-06-03 DIAGNOSIS — Z0000 Encounter for general adult medical examination without abnormal findings: Secondary | ICD-10-CM | POA: Diagnosis not present

## 2022-06-03 DIAGNOSIS — H539 Unspecified visual disturbance: Secondary | ICD-10-CM | POA: Diagnosis not present

## 2022-06-03 DIAGNOSIS — I671 Cerebral aneurysm, nonruptured: Secondary | ICD-10-CM

## 2022-06-03 DIAGNOSIS — E785 Hyperlipidemia, unspecified: Secondary | ICD-10-CM | POA: Diagnosis not present

## 2022-06-03 DIAGNOSIS — J439 Emphysema, unspecified: Secondary | ICD-10-CM | POA: Diagnosis not present

## 2022-06-07 ENCOUNTER — Other Ambulatory Visit: Payer: Self-pay | Admitting: Family Medicine

## 2022-06-07 DIAGNOSIS — I671 Cerebral aneurysm, nonruptured: Secondary | ICD-10-CM

## 2022-06-08 DIAGNOSIS — H0014 Chalazion left upper eyelid: Secondary | ICD-10-CM | POA: Diagnosis not present

## 2022-06-14 ENCOUNTER — Ambulatory Visit
Admission: RE | Admit: 2022-06-14 | Discharge: 2022-06-14 | Disposition: A | Discharge status: Home / Self Care | Payer: 59 | Source: Ambulatory Visit | Attending: Family Medicine | Admitting: Family Medicine

## 2022-06-14 DIAGNOSIS — I671 Cerebral aneurysm, nonruptured: Secondary | ICD-10-CM

## 2022-06-14 DIAGNOSIS — R519 Headache, unspecified: Secondary | ICD-10-CM | POA: Diagnosis not present

## 2022-06-14 NOTE — Progress Notes (Signed)
Patient brought to the nursing station via wheel chair by MRI staff after patient reported "feeling anxious and short of breath" after completion of MRI ANGIO HEAD/BRAIN WO CONTRAST. Patient reports slight chest discomfort while in nursing station at rest.   MD Shogry notified and evaluated in nursing station. Advised patient we could contact 911 and we could transport to ER by EMS for chest pain and SOB, patient refused.   MD Shogry advised patient to contact primary provider for same day appointment, patient agreed. Contacted MD Laurann [place] office, no sick visits available for 06/14/22 but MD White was sent STAT message regarding symptoms and will reach out to patient via phone to discuss current symptoms, confirmed contact information. MD White's office advised patient to go to same day [facility] if symptoms continued, patient verbalized understanding.   Vital signs stable, O2 saturation noted 100% on room air.   Symptoms improved after 15 minutes of rest in nursing area and completely resolved after 25 minutes resting in nursing station.   Educated patient if symptoms reoccur after leaving facility to seek emergency care, patient verbalized understanding.   Patient denies chest pain and shortness of breath at time of discharge.

## 2022-06-27 ENCOUNTER — Other Ambulatory Visit: Payer: Self-pay | Admitting: Acute Care

## 2022-06-27 DIAGNOSIS — F17210 Nicotine dependence, cigarettes, uncomplicated: Secondary | ICD-10-CM

## 2022-06-27 DIAGNOSIS — Z122 Encounter for screening for malignant neoplasm of respiratory organs: Secondary | ICD-10-CM

## 2022-06-27 DIAGNOSIS — Z87891 Personal history of nicotine dependence: Secondary | ICD-10-CM

## 2022-07-15 DIAGNOSIS — R3915 Urgency of urination: Secondary | ICD-10-CM | POA: Diagnosis not present

## 2022-07-15 DIAGNOSIS — N183 Chronic kidney disease, stage 3 unspecified: Secondary | ICD-10-CM | POA: Diagnosis not present

## 2022-07-15 DIAGNOSIS — J439 Emphysema, unspecified: Secondary | ICD-10-CM | POA: Diagnosis not present

## 2022-07-25 ENCOUNTER — Encounter: Payer: Self-pay | Admitting: Internal Medicine

## 2022-08-16 ENCOUNTER — Ambulatory Visit
Admission: RE | Admit: 2022-08-16 | Discharge: 2022-08-16 | Disposition: A | Discharge status: Home / Self Care | Payer: 59 | Source: Ambulatory Visit | Attending: Acute Care | Admitting: Acute Care

## 2022-08-16 DIAGNOSIS — F17210 Nicotine dependence, cigarettes, uncomplicated: Secondary | ICD-10-CM

## 2022-08-16 DIAGNOSIS — Z87891 Personal history of nicotine dependence: Secondary | ICD-10-CM

## 2022-08-16 DIAGNOSIS — Z122 Encounter for screening for malignant neoplasm of respiratory organs: Secondary | ICD-10-CM

## 2022-08-17 ENCOUNTER — Ambulatory Visit (AMBULATORY_SURGERY_CENTER): Payer: 59

## 2022-08-17 VITALS — Ht 64.0 in | Wt 158.0 lb

## 2022-08-17 DIAGNOSIS — Z86010 Personal history of colonic polyps: Secondary | ICD-10-CM

## 2022-08-17 MED ORDER — NA SULFATE-K SULFATE-MG SULF 17.5-3.13-1.6 GM/177ML PO SOLN: 1.0000 | Freq: Once | ORAL | 0 refills | Status: AC

## 2022-08-17 NOTE — Progress Notes (Signed)
Pre visit completed via phone call; Patient verified name, DOB, and address;  No egg or soy allergy known to patient; No issues known to pt with past sedation with any surgeries or procedures; Patient denies ever being told they had issues or difficulty with intubation;  No FH of Malignant Hyperthermia; Pt is not on diet pills; Pt is not on home 02;  Pt is not on blood thinners;  Pt denies issues with constipation;  No A fib or A flutter;  Have any cardiac testing pending--NO Insurance verified during PV appt--- [facility] Medicare  Pt can ambulate without assistance;  Pt denies use of chewing tobacco Discussed diabetic/weight loss medication holds; Discussed NSAID holds; Checked BMI to be less than 50; Pt instructed to use Single[employer]com or GoodRx for a price reduction on prep  Patient's chart reviewed by Cathlyn Parsons CNRA prior to previsit and patient appropriate for the [facility].  Pre visit completed and red dot placed by patient's name on their procedure day (on provider's schedule).    Instructions printed along with a [facility] GoodRx coupon and mailed to the patient per her request;

## 2022-08-22 ENCOUNTER — Other Ambulatory Visit: Payer: Self-pay | Admitting: Acute Care

## 2022-08-22 DIAGNOSIS — Z87891 Personal history of nicotine dependence: Secondary | ICD-10-CM

## 2022-08-22 DIAGNOSIS — F17210 Nicotine dependence, cigarettes, uncomplicated: Secondary | ICD-10-CM

## 2022-08-22 DIAGNOSIS — Z122 Encounter for screening for malignant neoplasm of respiratory organs: Secondary | ICD-10-CM

## 2022-09-09 ENCOUNTER — Ambulatory Visit (AMBULATORY_SURGERY_CENTER): Payer: 59 | Admitting: Internal Medicine

## 2022-09-09 ENCOUNTER — Encounter: Payer: Self-pay | Admitting: Internal Medicine

## 2022-09-09 VITALS — BP 120/73 | HR 78 | Temp 96.8°F | Resp 14 | Ht 64.0 in | Wt 158.0 lb

## 2022-09-09 DIAGNOSIS — D122 Benign neoplasm of ascending colon: Secondary | ICD-10-CM | POA: Diagnosis not present

## 2022-09-09 DIAGNOSIS — D124 Benign neoplasm of descending colon: Secondary | ICD-10-CM | POA: Diagnosis not present

## 2022-09-09 DIAGNOSIS — R7303 Prediabetes: Secondary | ICD-10-CM | POA: Diagnosis not present

## 2022-09-09 DIAGNOSIS — Z86010 Personal history of colonic polyps: Secondary | ICD-10-CM | POA: Diagnosis not present

## 2022-09-09 DIAGNOSIS — I10 Essential (primary) hypertension: Secondary | ICD-10-CM | POA: Diagnosis not present

## 2022-09-09 DIAGNOSIS — Z09 Encounter for follow-up examination after completed treatment for conditions other than malignant neoplasm: Secondary | ICD-10-CM

## 2022-09-09 DIAGNOSIS — K635 Polyp of colon: Secondary | ICD-10-CM | POA: Diagnosis not present

## 2022-09-09 DIAGNOSIS — D123 Benign neoplasm of transverse colon: Secondary | ICD-10-CM | POA: Diagnosis not present

## 2022-09-09 DIAGNOSIS — D125 Benign neoplasm of sigmoid colon: Secondary | ICD-10-CM

## 2022-09-09 MED ORDER — SODIUM CHLORIDE 0.9 % IV SOLN: 500.0000 mL | Freq: Once | INTRAVENOUS | Status: DC

## 2022-09-09 NOTE — Progress Notes (Signed)
Sedate, gd SR, tolerated procedure well, VSS, report to RN 

## 2022-09-09 NOTE — Progress Notes (Signed)
HISTORY OF PRESENT ILLNESS:  Crystal Stafford is a 62 y.o. female with a history of multiple polyps, both sessile serrated and adenomatous.  Now for surveillance colonoscopy.  Last examination 2020  REVIEW OF SYSTEMS:  All non-GI ROS negative except for  Past Medical History:  Diagnosis Date   Allergy    Anxiety    Arthritis    Depression    GERD (gastroesophageal reflux disease)    diet controlled   Headache    Hyperlipidemia    on meds   Hypertension    MRSA (methicillin resistant Staphylococcus aureus)    Pre-diabetes     Past Surgical History:  Procedure Laterality Date   ABDOMINAL HYSTERECTOMY     COLONOSCOPY     JP-MAC-suprep(exc)-SSp/TA-3 yr recall   IR ANGIO INTRA EXTRACRAN SEL COM CAROTID INNOMINATE UNI R MOD SED  12/12/2017   IR ANGIO INTRA EXTRACRAN SEL INTERNAL CAROTID BILAT MOD SED  10/20/2016   IR ANGIO INTRA EXTRACRAN SEL INTERNAL CAROTID BILAT MOD SED  12/09/2016   IR ANGIO INTRA EXTRACRAN SEL INTERNAL CAROTID BILAT MOD SED  06/16/2017   IR ANGIO INTRA EXTRACRAN SEL INTERNAL CAROTID UNI L MOD SED  12/12/2017   IR ANGIO VERTEBRAL SEL VERTEBRAL BILAT MOD SED  10/20/2016   IR ANGIO VERTEBRAL SEL VERTEBRAL BILAT MOD SED  06/16/2017   IR ANGIO VERTEBRAL SEL VERTEBRAL UNI R MOD SED  12/12/2017   IR ANGIOGRAM FOLLOW UP STUDY  12/09/2016   IR RADIOLOGY PERIPHERAL GUIDED IV START  12/12/2017   IR TRANSCATH/EMBOLIZ  12/09/2016   IR US GUIDE VASC ACCESS RIGHT  06/16/2017   IR US GUIDE VASC ACCESS RIGHT  12/12/2017   RADIOLOGY WITH ANESTHESIA N/A 12/09/2016   Procedure: Pipeline embolization of aneurysm and possible coiling;  Surgeon: Lisbeth Renshaw, MD;  Location: [facility] OR;  Service: Radiology;  Laterality: N/A;    Social History Crystal Stafford  reports that she quit smoking 13 days ago. Her smoking use included cigarettes. She has a 20 pack-year smoking history. She has never used smokeless tobacco. She reports that she does not drink alcohol and does not use  drugs.  family history includes Breast cancer in her maternal aunt; Colon cancer (age of onset: 45) in her maternal grandfather; Colon polyps (age of onset: 64) in her maternal grandfather; Diabetes in an other family member; Heart disease in her mother; Hypertension in an other family member.  Allergies  Allergen Reactions   Doxycycline Shortness Of Breath, Swelling and Rash    Face swelling   Doxycycline Hyclate Hives, Shortness Of Breath and Swelling   Escitalopram     Other Reaction(s): restless leg syndrome   Doxycycline Rash   Tape Itching and Rash       PHYSICAL EXAMINATION: Vital signs: BP 129/81   Pulse 88   Temp (!) 96.8 F (36 C) (Temporal)   Ht 5\' 4"  (1.626 m)   Wt 158 lb (71.7 kg)   SpO2 99%   BMI 27.12 kg/m  General: Well-developed, well-nourished, no acute distress HEENT: Sclerae are anicteric, conjunctiva pink. Oral mucosa intact Lungs: Clear Heart: Regular Abdomen: soft, nontender, nondistended, no obvious ascites, no peritoneal signs, normal bowel sounds. No organomegaly. Extremities: No edema Psychiatric: alert and oriented x3. Cooperative     ASSESSMENT:  Personal history of adenomatous and sessile serrated polyps   PLAN:   Surveillance colonoscopy

## 2022-09-09 NOTE — Op Note (Signed)
[facility] Patient Name: Crystal Stafford Procedure Date: 09/09/2022 10:08 AM MRN: [id number] Endoscopist: Wilhemina Bonito. Marina Goodell , MD, [id number] Age: 62 Referring MD:  Date of Birth: Jan 04, 1961 Gender: Female Account #: [id number] Procedure:                Colonoscopy with cold snare x 6; bx polypectomy x 1 Indications:              High risk colon cancer surveillance: Personal                            history of multiple (3 or more) adenomas, High risk                            colon cancer surveillance: Personal history of                            sessile serrated colon polyp (less than 10 mm in                            size) with no dysplasia. Prior exams 2014, 2015,                            2020 Medicines:                Monitored Anesthesia Care Procedure:                Pre-Anesthesia Assessment:                           - Prior to the procedure, a History and Physical                            was performed, and patient medications and                            allergies were reviewed. The patient's tolerance of                            previous anesthesia was also reviewed. The risks                            and benefits of the procedure and the sedation                            options and risks were discussed with the patient.                            All questions were answered, and informed consent                            was obtained. Prior Anticoagulants: The patient has                            taken no anticoagulant or antiplatelet agents. ASA  Grade Assessment: II - A patient with mild systemic                            disease. After reviewing the risks and benefits,                            the patient was deemed in satisfactory condition to                            undergo the procedure.                           After obtaining informed consent, the colonoscope                            was passed under  direct vision. Throughout the                            procedure, the patient's blood pressure, pulse, and                            oxygen saturations were monitored continuously. The                            CF [id number] #[id number] was introduced through the anus                            and advanced to the the cecum, identified by                            appendiceal orifice and ileocecal valve. The                            ileocecal valve, appendiceal orifice, and rectum                            were photographed. The quality of the bowel                            preparation was excellent. The colonoscopy was                            performed without difficulty. The patient tolerated                            the procedure well. The bowel preparation used was                            SUPREP via split dose instruction. Scope In: 10:28:05 AM Scope Out: 10:45:48 AM Scope Withdrawal Time: 0 hours 11 minutes 33 seconds  Total Procedure Duration: 0 hours 17 minutes 43 seconds  Findings:                 Six polyps were found in the sigmoid colon,  descending colon and transverse colon. The polyps                            were 2 to 4 mm in size. These polyps were removed                            with a cold snare. Resection and retrieval were                            complete.                           A 1 mm polyp was found in the ascending colon. The                            polyp was removed with a jumbo cold forceps.                            Resection and retrieval were complete.                           Internal hemorrhoids were found during                            retroflexion. The hemorrhoids were small.                           The exam was otherwise without abnormality on                            direct and retroflexion views. Complications:            No immediate complications. Estimated blood loss:                             None. Estimated Blood Loss:     Estimated blood loss: none. Impression:               - Six 2 to 4 mm polyps in the sigmoid colon, in the                            descending colon and in the transverse colon,                            removed with a cold snare. Resected and retrieved.                           - One 1 mm polyp in the ascending colon, removed                            with a jumbo cold forceps. Resected and retrieved.                           - Internal hemorrhoids.                           -  The examination was otherwise normal on direct                            and retroflexion views. Recommendation:           - Repeat colonoscopy in 3 - 5 years for                            surveillance.                           - Patient has a contact number available for                            emergencies. The signs and symptoms of potential                            delayed complications were discussed with the                            patient. Return to normal activities tomorrow.                            Written discharge instructions were provided to the                            patient.                           - Resume previous diet.                           - Continue present medications.                           - Await pathology results. Wilhemina Bonito. Marina Goodell, MD 09/09/2022 10:55:08 AM This report has been signed electronically.

## 2022-09-09 NOTE — Progress Notes (Signed)
Pt's states no medical or surgical changes since previsit or office visit. 

## 2022-09-09 NOTE — Patient Instructions (Signed)
 Await pathology results.  Continue present medications.  Resume previous diet.  Handout on polyps and hemorrhoids provided.  YOU HAD AN ENDOSCOPIC PROCEDURE TODAY AT THE [facility]:   Refer to the procedure report that was given to you for any specific questions about what was found during the examination.  If the procedure report does not answer your questions, please call your gastroenterologist to clarify.  If you requested that your care partner not be given the details of your procedure findings, then the procedure report has been included in a sealed envelope for you to review at your convenience later.  YOU SHOULD EXPECT: Some feelings of bloating in the abdomen. Passage of more gas than usual.  Walking can help get rid of the air that was put into your GI tract during the procedure and reduce the bloating. If you had a lower endoscopy (such as a colonoscopy or flexible sigmoidoscopy) you may notice spotting of blood in your stool or on the toilet paper. If you underwent a bowel prep for your procedure, you may not have a normal bowel movement for a few days.  Please Note:  You might notice some irritation and congestion in your nose or some drainage.  This is from the oxygen used during your procedure.  There is no need for concern and it should clear up in a day or so.  SYMPTOMS TO REPORT IMMEDIATELY:  Following lower endoscopy (colonoscopy or flexible sigmoidoscopy):  Excessive amounts of blood in the stool  Significant tenderness or worsening of abdominal pains  Swelling of the abdomen that is new, acute  Fever of 100F or higher   For urgent or emergent issues, a gastroenterologist can be reached at any hour by calling [phone]. Do not use MyChart messaging for urgent concerns.    DIET:  We do recommend a small meal at first, but then you may proceed to your regular diet.  Drink plenty of fluids but you should avoid alcoholic beverages for 24  hours.  ACTIVITY:  You should plan to take it easy for the rest of today and you should NOT DRIVE or use heavy machinery until tomorrow (because of the sedation medicines used during the test).    FOLLOW UP: Our staff will call the number listed on your records the next business day following your procedure.  We will call around 7:15- 8:00 am to check on you and address any questions or concerns that you may have regarding the information given to you following your procedure. If we do not reach you, we will leave a message.     If any biopsies were taken you will be contacted by phone or by letter within the next 1-3 weeks.  Please call us at [phone] if you have not heard about the biopsies in 3 weeks.    SIGNATURES/CONFIDENTIALITY: You and/or your care partner have signed paperwork which will be entered into your electronic medical record.  These signatures attest to the fact that that the information above on your After Visit Summary has been reviewed and is understood.  Full responsibility of the confidentiality of this discharge information lies with you and/or your care-partner.

## 2022-09-09 NOTE — Progress Notes (Signed)
Called to room to assist during endoscopic procedure.  Patient ID and intended procedure confirmed with present staff. Received instructions for my participation in the procedure from the performing physician.  

## 2022-09-13 ENCOUNTER — Telehealth: Payer: Self-pay | Admitting: *Deleted

## 2022-09-13 NOTE — Telephone Encounter (Signed)
  Follow up Call-     09/09/2022    9:51 AM  Call back number  Post procedure Call Back phone  # [phone]  Permission to leave phone message Yes     Patient questions:  Do you have a fever, pain , or abdominal swelling? No. Pain Score  0 *  Have you tolerated food without any problems? Yes.    Have you been able to return to your normal activities? Yes.    Do you have any questions about your discharge instructions: Diet   No. Medications  No. Follow up visit  No.  Do you have questions or concerns about your Care? No.  Actions: * If pain score is 4 or above: No action needed, pain <4.

## 2022-09-14 ENCOUNTER — Encounter: Payer: Self-pay | Admitting: Internal Medicine

## 2022-09-30 DIAGNOSIS — Z1231 Encounter for screening mammogram for malignant neoplasm of breast: Secondary | ICD-10-CM | POA: Diagnosis not present

## 2022-10-11 DIAGNOSIS — H2513 Age-related nuclear cataract, bilateral: Secondary | ICD-10-CM | POA: Diagnosis not present

## 2022-10-11 DIAGNOSIS — E119 Type 2 diabetes mellitus without complications: Secondary | ICD-10-CM | POA: Diagnosis not present

## 2022-10-11 DIAGNOSIS — H5203 Hypermetropia, bilateral: Secondary | ICD-10-CM | POA: Diagnosis not present

## 2022-10-14 DIAGNOSIS — J439 Emphysema, unspecified: Secondary | ICD-10-CM | POA: Diagnosis not present

## 2022-10-14 DIAGNOSIS — Z23 Encounter for immunization: Secondary | ICD-10-CM | POA: Diagnosis not present

## 2022-10-14 DIAGNOSIS — I700 Atherosclerosis of aorta: Secondary | ICD-10-CM | POA: Diagnosis not present

## 2022-10-14 DIAGNOSIS — Z87891 Personal history of nicotine dependence: Secondary | ICD-10-CM | POA: Diagnosis not present

## 2022-10-14 DIAGNOSIS — M62838 Other muscle spasm: Secondary | ICD-10-CM | POA: Diagnosis not present

## 2022-10-14 DIAGNOSIS — N183 Chronic kidney disease, stage 3 unspecified: Secondary | ICD-10-CM | POA: Diagnosis not present

## 2022-10-14 DIAGNOSIS — E1122 Type 2 diabetes mellitus with diabetic chronic kidney disease: Secondary | ICD-10-CM | POA: Diagnosis not present

## 2022-10-14 DIAGNOSIS — G2581 Restless legs syndrome: Secondary | ICD-10-CM | POA: Diagnosis not present

## 2022-10-14 DIAGNOSIS — E785 Hyperlipidemia, unspecified: Secondary | ICD-10-CM | POA: Diagnosis not present

## 2022-10-25 DIAGNOSIS — H5203 Hypermetropia, bilateral: Secondary | ICD-10-CM | POA: Diagnosis not present

## 2022-10-25 DIAGNOSIS — H10413 Chronic giant papillary conjunctivitis, bilateral: Secondary | ICD-10-CM | POA: Diagnosis not present

## 2023-01-02 DIAGNOSIS — H5310 Unspecified subjective visual disturbances: Secondary | ICD-10-CM | POA: Diagnosis not present

## 2023-01-02 DIAGNOSIS — G453 Amaurosis fugax: Secondary | ICD-10-CM | POA: Diagnosis not present

## 2023-01-03 DIAGNOSIS — I700 Atherosclerosis of aorta: Secondary | ICD-10-CM | POA: Diagnosis not present

## 2023-01-03 DIAGNOSIS — G453 Amaurosis fugax: Secondary | ICD-10-CM | POA: Diagnosis not present

## 2023-01-03 DIAGNOSIS — Z86718 Personal history of other venous thrombosis and embolism: Secondary | ICD-10-CM | POA: Diagnosis not present

## 2023-01-03 DIAGNOSIS — M62838 Other muscle spasm: Secondary | ICD-10-CM | POA: Diagnosis not present

## 2023-01-03 DIAGNOSIS — E785 Hyperlipidemia, unspecified: Secondary | ICD-10-CM | POA: Diagnosis not present

## 2023-01-06 ENCOUNTER — Ambulatory Visit (HOSPITAL_COMMUNITY)
Admission: RE | Admit: 2023-01-06 | Discharge: 2023-01-06 | Disposition: A | Discharge status: Home / Self Care | Payer: 59 | Source: Ambulatory Visit | Attending: Cardiovascular Disease | Admitting: Cardiovascular Disease

## 2023-01-06 ENCOUNTER — Other Ambulatory Visit (HOSPITAL_COMMUNITY): Payer: Self-pay | Admitting: Optometry

## 2023-01-06 DIAGNOSIS — G453 Amaurosis fugax: Secondary | ICD-10-CM | POA: Diagnosis not present

## 2023-01-20 DIAGNOSIS — G453 Amaurosis fugax: Secondary | ICD-10-CM | POA: Diagnosis not present

## 2023-02-14 DIAGNOSIS — I700 Atherosclerosis of aorta: Secondary | ICD-10-CM | POA: Diagnosis not present

## 2023-02-14 DIAGNOSIS — G2581 Restless legs syndrome: Secondary | ICD-10-CM | POA: Diagnosis not present

## 2023-02-14 DIAGNOSIS — E785 Hyperlipidemia, unspecified: Secondary | ICD-10-CM | POA: Diagnosis not present

## 2023-02-14 DIAGNOSIS — N183 Chronic kidney disease, stage 3 unspecified: Secondary | ICD-10-CM | POA: Diagnosis not present

## 2023-02-14 DIAGNOSIS — E1169 Type 2 diabetes mellitus with other specified complication: Secondary | ICD-10-CM | POA: Diagnosis not present

## 2023-02-14 DIAGNOSIS — I129 Hypertensive chronic kidney disease with stage 1 through stage 4 chronic kidney disease, or unspecified chronic kidney disease: Secondary | ICD-10-CM | POA: Diagnosis not present

## 2023-02-14 DIAGNOSIS — G453 Amaurosis fugax: Secondary | ICD-10-CM | POA: Diagnosis not present

## 2023-02-15 ENCOUNTER — Encounter: Payer: Self-pay | Admitting: *Deleted

## 2023-02-15 ENCOUNTER — Ambulatory Visit (INDEPENDENT_AMBULATORY_CARE_PROVIDER_SITE_OTHER): Payer: 59 | Admitting: Diagnostic Neuroimaging

## 2023-02-15 ENCOUNTER — Encounter: Payer: Self-pay | Admitting: Diagnostic Neuroimaging

## 2023-02-15 VITALS — BP 138/85 | HR 77 | Ht 64.0 in | Wt 169.2 lb

## 2023-02-15 DIAGNOSIS — I671 Cerebral aneurysm, nonruptured: Secondary | ICD-10-CM | POA: Diagnosis not present

## 2023-02-15 DIAGNOSIS — G453 Amaurosis fugax: Secondary | ICD-10-CM | POA: Diagnosis not present

## 2023-02-15 DIAGNOSIS — E1169 Type 2 diabetes mellitus with other specified complication: Secondary | ICD-10-CM | POA: Diagnosis not present

## 2023-02-15 DIAGNOSIS — I729 Aneurysm of unspecified site: Secondary | ICD-10-CM | POA: Diagnosis not present

## 2023-02-15 DIAGNOSIS — E785 Hyperlipidemia, unspecified: Secondary | ICD-10-CM | POA: Diagnosis not present

## 2023-02-15 DIAGNOSIS — I749 Embolism and thrombosis of unspecified artery: Secondary | ICD-10-CM

## 2023-02-15 DIAGNOSIS — G459 Transient cerebral ischemic attack, unspecified: Secondary | ICD-10-CM

## 2023-02-15 DIAGNOSIS — I129 Hypertensive chronic kidney disease with stage 1 through stage 4 chronic kidney disease, or unspecified chronic kidney disease: Secondary | ICD-10-CM | POA: Diagnosis not present

## 2023-02-15 NOTE — Patient Instructions (Signed)
  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

## 2023-02-15 NOTE — Progress Notes (Signed)
 [facility]  PATIENT: Crystal Stafford DOB: 01/30/1960  REFERRING CLINICIAN: Teresa Channel, MD HISTORY FROM: patient  REASON FOR VISIT: new consult   HISTORICAL  CHIEF COMPLAINT:  Chief Complaint  Patient presents with   New Patient (Initial Visit)    Pt in room 7 alone.. New patient paper referral here for Amaurosis fugax. Pt her left eye went completely back last year while looking at her phone. Left eye still blurry.  Last eye appointment was in Jan.     HISTORY OF PRESENT ILLNESS:   63 year old female with transient left vision loss.  Early December 2024 patient had episode where she lost vision in her left eye for 10 minutes and then resolved.  2018 patient had severe headaches and had CT of the head demonstrating unruptured intracranial aneurysm.  This was found to be a left paraophthalmic aneurysm treated with pipeline device assisted embolization.  This has been stable.  She was on aspirin  and Plavix  for several months after the treatment.  Has not been on either for several years.  After recent event of vision loss in December 2024 PCP has restarted aspirin  81 mg daily.  No facial numbness or weakness.  No arm or leg numbness or weakness.  No headaches.   REVIEW OF SYSTEMS: Full 14 system review of systems performed and negative with exception of: as per HPI.  ALLERGIES: Allergies  Allergen Reactions   Doxycycline Shortness Of Breath, Swelling and Rash    Face swelling   Doxycycline Hyclate Hives, Shortness Of Breath and Swelling   Escitalopram     Other Reaction(s): restless leg syndrome   Doxycycline Rash   Tape Itching and Rash    HOME MEDICATIONS: Outpatient Medications Prior to Visit  Medication Sig Dispense Refill   aspirin  EC 81 MG tablet Take 81 mg by mouth daily. Swallow whole.     atorvastatin  (LIPITOR) 40 MG tablet Take 40 mg by mouth daily.     buPROPion (WELLBUTRIN XL) 150 MG 24 hr tablet Take 150 mg by mouth daily.      rOPINIRole (REQUIP) 0.5 MG tablet Take 0.5 mg by mouth at bedtime.     No facility-administered medications prior to visit.    PAST MEDICAL HISTORY: Past Medical History:  Diagnosis Date   Allergy    Anxiety    Arthritis    Depression    GERD (gastroesophageal reflux disease)    diet controlled   Headache    Hyperlipidemia    on meds   Hypertension    MRSA (methicillin resistant Staphylococcus aureus)    Pre-diabetes     PAST SURGICAL HISTORY: Past Surgical History:  Procedure Laterality Date   ABDOMINAL HYSTERECTOMY     COLONOSCOPY     JP-MAC-suprep(exc)-SSp/TA-3 yr recall   IR ANGIO INTRA EXTRACRAN SEL COM CAROTID INNOMINATE UNI R MOD SED  12/12/2017   IR ANGIO INTRA EXTRACRAN SEL INTERNAL CAROTID BILAT MOD SED  10/20/2016   IR ANGIO INTRA EXTRACRAN SEL INTERNAL CAROTID BILAT MOD SED  12/09/2016   IR ANGIO INTRA EXTRACRAN SEL INTERNAL CAROTID BILAT MOD SED  06/16/2017   IR ANGIO INTRA EXTRACRAN SEL INTERNAL CAROTID UNI L MOD SED  12/12/2017   IR ANGIO VERTEBRAL SEL VERTEBRAL BILAT MOD SED  10/20/2016   IR ANGIO VERTEBRAL SEL VERTEBRAL BILAT MOD SED  06/16/2017   IR ANGIO VERTEBRAL SEL VERTEBRAL UNI R MOD SED  12/12/2017   IR ANGIOGRAM FOLLOW UP STUDY  12/09/2016   IR RADIOLOGY PERIPHERAL GUIDED  IV START  12/12/2017   IR TRANSCATH/EMBOLIZ  12/09/2016   IR US  GUIDE VASC ACCESS RIGHT  06/16/2017   IR US  GUIDE VASC ACCESS RIGHT  12/12/2017   RADIOLOGY WITH ANESTHESIA N/A 12/09/2016   Procedure: Pipeline embolization of aneurysm and possible coiling;  Surgeon: Lanis Pupa, MD;  Location: [facility] OR;  Service: Radiology;  Laterality: N/A;    FAMILY HISTORY: Family History  Problem Relation Age of Onset   Heart disease Mother    Breast cancer Maternal Aunt    Colon polyps Maternal Grandfather 78   Colon cancer Maternal Grandfather 72   Hypertension Other    Diabetes Other    Esophageal cancer Neg Hx    Stomach cancer Neg Hx    Rectal cancer Neg Hx     SOCIAL  HISTORY: Social History   Socioeconomic History   Marital status: Divorced    Spouse name: Not on file   Number of children: 2   Years of education: Not on file   Highest education level: Not on file  Occupational History   Not on file  Tobacco Use   Smoking status: Former    Current packs/day: 0.00    Average packs/day: 0.5 packs/day for 40.0 years (20.0 ttl pk-yrs)    Types: Cigarettes    Quit date: 08/27/2022    Years since quitting: 0.4   Smokeless tobacco: Never   Tobacco comments:    tobacco info given 02/08/2018  Vaping Use   Vaping status: Every Day   Substances: Nicotine   Substance and Sexual Activity   Alcohol use: Never   Drug use: Never   Sexual activity: Yes    Birth control/protection: None  Other Topics Concern   Not on file  Social History Narrative   ** Merged History Encounter **       Right handed    Wears glasses   Drinks coffee 1 cup in am and 1 cup at night    Social Drivers of Health   Financial Resource Strain: Not on file  Food Insecurity: Not on file  Transportation Needs: Not on file  Physical Activity: Not on file  Stress: Not on file  Social Connections: Unknown (05/25/2021)   [facility]   Social Network    Social Network: Not on file  Intimate Partner Violence: Unknown (04/16/2021)   Received from [employer], [facility]   HITS    Physically Hurt: Not on file    Insult or Talk Down To: Not on file    Threaten Physical Harm: Not on file    Scream or Curse: Not on file     PHYSICAL EXAM  GENERAL EXAM/CONSTITUTIONAL: Vitals:  Vitals:   02/15/23 1103  BP: 138/85  Pulse: 77  Weight: 169 lb 3.2 oz (76.7 kg)  Height: 5' 4 (1.626 m)   Body mass index is 29.04 kg/m. Wt Readings from Last 3 Encounters:  02/15/23 169 lb 3.2 oz (76.7 kg)  09/09/22 158 lb (71.7 kg)  08/17/22 158 lb (71.7 kg)   Patient is in no distress; well developed, nourished and groomed; neck is  supple  CARDIOVASCULAR: Examination of carotid arteries is normal; no carotid bruits Regular rate and rhythm, no murmurs Examination of peripheral vascular system by observation and palpation is normal  EYES: Ophthalmoscopic exam of optic discs and posterior segments is normal; no papilledema or hemorrhages Vision Screening   Right eye Left eye Both eyes  Without correction     With correction  20/30 20/30 20/20     MUSCULOSKELETAL: Gait, strength, tone, movements noted in Neurologic exam below  NEUROLOGIC: MENTAL STATUS:      No data to display         awake, alert, oriented to person, place and time recent and remote memory intact normal attention and concentration language fluent, comprehension intact, naming intact fund of knowledge appropriate  CRANIAL NERVE:  2nd - no papilledema on fundoscopic exam 2nd, 3rd, 4th, 6th - pupils equal and reactive to light, visual fields full to confrontation, extraocular muscles intact, no nystagmus 5th - facial sensation symmetric 7th - facial strength symmetric 8th - hearing intact 9th - palate elevates symmetrically, uvula midline 11th - shoulder shrug symmetric 12th - tongue protrusion midline  MOTOR:  normal bulk and tone, full strength in the BUE, BLE  SENSORY:  normal and symmetric to light touch  COORDINATION:  finger-nose-finger, fine finger movements normal  REFLEXES:  deep tendon reflexes trace and symmetric  GAIT/STATION:  narrow based gait   DIAGNOSTIC DATA (LABS, IMAGING, TESTING) - I reviewed patient records, labs, notes, testing and imaging myself where available.  Lab Results  Component Value Date   WBC 6.6 12/07/2020   HGB 14.3 12/07/2020   HCT 42.0 12/07/2020   MCV 89.7 12/07/2020   PLT 244 12/07/2020      Component Value Date/Time   NA 140 12/07/2020 2046   NA 141 11/09/2016 1054   K 3.9 12/07/2020 2046   CL 112 (H) 12/07/2020 2046   CO2 21 (L) 12/07/2020 2046   GLUCOSE 84 12/07/2020  2046   BUN 9 12/07/2020 2046   BUN 11 11/09/2016 1054   CREATININE 1.19 (H) 12/07/2020 2046   CREATININE 1.00 06/04/2015 1147   CALCIUM  8.8 (L) 12/07/2020 2046   PROT 6.3 (L) 12/07/2020 2046   PROT 7.1 11/09/2016 1054   ALBUMIN 3.4 (L) 12/07/2020 2046   ALBUMIN 4.3 11/09/2016 1054   AST 20 12/07/2020 2046   ALT 11 12/07/2020 2046   ALKPHOS 73 12/07/2020 2046   BILITOT 0.4 12/07/2020 2046   BILITOT <0.2 11/09/2016 1054   GFRNONAA 53 (L) 12/07/2020 2046   GFRNONAA 65 06/04/2015 1147   GFRAA >60 12/12/2017 0927   GFRAA 75 06/04/2015 1147   Lab Results  Component Value Date   CHOL 200 (H) 11/09/2016   HDL 32 (L) 11/09/2016   LDLCALC 117 (H) 11/09/2016   TRIG 253 (H) 11/09/2016   CHOLHDL 6.3 (H) 11/09/2016   Lab Results  Component Value Date   HGBA1C 6.0 11/09/2016   No results found for: VITAMINB12 Lab Results  Component Value Date   TSH 1.490 09/01/2016    09/08/16 CT head  - 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 - No acute intracranial hemorrhage. - Question small RIGHT inferior frontal infarct, age-indeterminate.  06/14/22 MRI brain / MRA head  1. No acute intracranial pathology to explain the patient's symptoms. 2. Status post pipeline embolization of a left paraophthalmic ICA aneurysm without evidence of recurrent aneurysm.   01/06/23 carotid u/s  Right Carotid: Beaded appearance of mid to distal ICA; upper-range 1-39% stenosis noted in the this area. Cannot exclude fibromuscular dysplasia. Further imaging may be warranted if clinically indicated.   Left Carotid: Slight beaded appearance of mid to distal ICA; upper-range 1-39% stenosis noted in the this area. Cannot exclude fibromuscular dysplasia. Further imaging may be warranted if clinically indicated.   Vertebrals:  Bilateral vertebral arteries demonstrate antegrade flow.   Subclavians: Normal flow hemodynamics were seen in  the left subclavian artery. Atypical waveform of right subclavian artery.    01/20/23 TTE - EF 57% - Normal valves   ASSESSMENT AND PLAN  63 y.o. year old female here with:   Dx:  1. 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 Aneurysm (HCC)   3. Cerebral aneurysm   4. Cerebral aneurysm without rupture   5. 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  I reviewed images, labs, notes, records myself. I summarized findings and reviewed with patient, for this high risk condition (TIA) requiring high complexity decision making.   EDUARD FABIENE HANLON, MD 02/15/2023, 12:04 PM Certified in Neurology, Neurophysiology and Neuroimaging  [facility] [address] [place], [place] [address] [phone]

## 2023-02-16 ENCOUNTER — Telehealth: Payer: Self-pay | Admitting: Diagnostic Neuroimaging

## 2023-02-16 NOTE — Telephone Encounter (Signed)
 [employer] medicare/[place] medicaid NPR sent to GI [phone]

## 2023-02-28 ENCOUNTER — Ambulatory Visit
Admission: RE | Admit: 2023-02-28 | Discharge: 2023-02-28 | Disposition: A | Discharge status: Home / Self Care | Payer: 59 | Source: Ambulatory Visit | Attending: Diagnostic Neuroimaging | Admitting: Diagnostic Neuroimaging

## 2023-02-28 DIAGNOSIS — G453 Amaurosis fugax: Secondary | ICD-10-CM | POA: Diagnosis not present

## 2023-02-28 DIAGNOSIS — I671 Cerebral aneurysm, nonruptured: Secondary | ICD-10-CM | POA: Diagnosis not present

## 2023-02-28 DIAGNOSIS — I749 Embolism and thrombosis of unspecified artery: Secondary | ICD-10-CM | POA: Diagnosis not present

## 2023-02-28 DIAGNOSIS — G459 Transient cerebral ischemic attack, unspecified: Secondary | ICD-10-CM

## 2023-02-28 MED ORDER — IOPAMIDOL (ISOVUE-370) INJECTION 76%
75.0000 mL | Freq: Once | INTRAVENOUS | Status: AC | PRN
  Administered 2023-02-28: 75 mL via INTRAVENOUS

## 2023-03-16 NOTE — Progress Notes (Signed)
 Results are good, no major findings. Continue current plan. -VRP

## 2023-04-11 DIAGNOSIS — E785 Hyperlipidemia, unspecified: Secondary | ICD-10-CM | POA: Diagnosis not present

## 2023-04-11 DIAGNOSIS — Z79899 Other long term (current) drug therapy: Secondary | ICD-10-CM | POA: Diagnosis not present

## 2023-06-21 DIAGNOSIS — E1169 Type 2 diabetes mellitus with other specified complication: Secondary | ICD-10-CM | POA: Diagnosis not present

## 2023-06-21 DIAGNOSIS — G2581 Restless legs syndrome: Secondary | ICD-10-CM | POA: Diagnosis not present

## 2023-06-21 DIAGNOSIS — Z23 Encounter for immunization: Secondary | ICD-10-CM | POA: Diagnosis not present

## 2023-06-21 DIAGNOSIS — E785 Hyperlipidemia, unspecified: Secondary | ICD-10-CM | POA: Diagnosis not present

## 2023-06-21 DIAGNOSIS — J439 Emphysema, unspecified: Secondary | ICD-10-CM | POA: Diagnosis not present

## 2023-06-21 DIAGNOSIS — Z0000 Encounter for general adult medical examination without abnormal findings: Secondary | ICD-10-CM | POA: Diagnosis not present

## 2023-06-21 DIAGNOSIS — I671 Cerebral aneurysm, nonruptured: Secondary | ICD-10-CM | POA: Diagnosis not present

## 2023-06-21 DIAGNOSIS — N183 Chronic kidney disease, stage 3 unspecified: Secondary | ICD-10-CM | POA: Diagnosis not present

## 2023-06-21 DIAGNOSIS — I129 Hypertensive chronic kidney disease with stage 1 through stage 4 chronic kidney disease, or unspecified chronic kidney disease: Secondary | ICD-10-CM | POA: Diagnosis not present

## 2023-06-21 DIAGNOSIS — I700 Atherosclerosis of aorta: Secondary | ICD-10-CM | POA: Diagnosis not present

## 2023-07-17 DIAGNOSIS — I671 Cerebral aneurysm, nonruptured: Secondary | ICD-10-CM | POA: Diagnosis not present

## 2023-08-16 ENCOUNTER — Encounter: Payer: Self-pay | Admitting: Acute Care

## 2023-10-09 DIAGNOSIS — Z1231 Encounter for screening mammogram for malignant neoplasm of breast: Secondary | ICD-10-CM | POA: Diagnosis not present
